# Patient Record
Sex: Male | Born: 1965 | Race: Black or African American | Hispanic: No | Marital: Single | Smoking: Current every day smoker
Health system: Southern US, Community
[De-identification: ages and names within clinical notes are randomized; demographics above are authoritative.]

## PROBLEM LIST (undated history)

## (undated) DIAGNOSIS — I10 Essential (primary) hypertension: Secondary | ICD-10-CM

## (undated) DIAGNOSIS — J449 Chronic obstructive pulmonary disease, unspecified: Secondary | ICD-10-CM

## (undated) DIAGNOSIS — E119 Type 2 diabetes mellitus without complications: Secondary | ICD-10-CM

## (undated) DIAGNOSIS — D571 Sickle-cell disease without crisis: Secondary | ICD-10-CM

## (undated) DIAGNOSIS — I219 Acute myocardial infarction, unspecified: Secondary | ICD-10-CM

## (undated) DIAGNOSIS — F1120 Opioid dependence, uncomplicated: Secondary | ICD-10-CM

## (undated) HISTORY — PX: PORT-A-CATH REMOVAL: SHX5289

## (undated) HISTORY — PX: KNEE SURGERY: SHX244

## (undated) HISTORY — PX: ANKLE SURGERY: SHX546

---

## 2017-01-07 ENCOUNTER — Emergency Department
Admission: EM | Admit: 2017-01-07 | Discharge: 2017-01-08 | Disposition: A | Discharge status: Other Acute Inpatient Hospital | Payer: Medicare Other | Attending: Emergency Medicine | Admitting: Emergency Medicine

## 2017-01-07 ENCOUNTER — Encounter: Payer: Self-pay | Admitting: Emergency Medicine

## 2017-01-07 ENCOUNTER — Emergency Department: Payer: Medicare Other

## 2017-01-07 DIAGNOSIS — E119 Type 2 diabetes mellitus without complications: Secondary | ICD-10-CM | POA: Diagnosis not present

## 2017-01-07 DIAGNOSIS — D5701 Hb-SS disease with acute chest syndrome: Secondary | ICD-10-CM | POA: Diagnosis not present

## 2017-01-07 DIAGNOSIS — D5700 Hb-SS disease with crisis, unspecified: Secondary | ICD-10-CM | POA: Diagnosis not present

## 2017-01-07 DIAGNOSIS — F17200 Nicotine dependence, unspecified, uncomplicated: Secondary | ICD-10-CM | POA: Diagnosis not present

## 2017-01-07 DIAGNOSIS — I10 Essential (primary) hypertension: Secondary | ICD-10-CM | POA: Diagnosis not present

## 2017-01-07 DIAGNOSIS — J449 Chronic obstructive pulmonary disease, unspecified: Secondary | ICD-10-CM | POA: Insufficient documentation

## 2017-01-07 HISTORY — DX: Chronic obstructive pulmonary disease, unspecified: J44.9

## 2017-01-07 HISTORY — DX: Essential (primary) hypertension: I10

## 2017-01-07 HISTORY — DX: Acute myocardial infarction, unspecified: I21.9

## 2017-01-07 HISTORY — DX: Type 2 diabetes mellitus without complications: E11.9

## 2017-01-07 HISTORY — DX: Sickle-cell disease without crisis: D57.1

## 2017-01-07 LAB — CBC
HCT: 39.8 % — ABNORMAL LOW (ref 40.0–52.0)
Hemoglobin: 12.6 g/dL — ABNORMAL LOW (ref 13.0–18.0)
MCH: 22.2 pg — ABNORMAL LOW (ref 26.0–34.0)
MCHC: 31.6 g/dL — ABNORMAL LOW (ref 32.0–36.0)
MCV: 70.2 fL — ABNORMAL LOW (ref 80.0–100.0)
Platelets: 421 10*3/uL (ref 150–440)
RBC: 5.67 MIL/uL (ref 4.40–5.90)
RDW: 18 % — ABNORMAL HIGH (ref 11.5–14.5)
WBC: 17.5 10*3/uL — ABNORMAL HIGH (ref 3.8–10.6)

## 2017-01-07 LAB — COMPREHENSIVE METABOLIC PANEL
ALK PHOS: 113 U/L (ref 38–126)
ALT: 11 U/L — AB (ref 17–63)
AST: 12 U/L — AB (ref 15–41)
Albumin: 3.5 g/dL (ref 3.5–5.0)
Anion gap: 11 (ref 5–15)
BUN: 14 mg/dL (ref 6–20)
CO2: 25 mmol/L (ref 22–32)
CREATININE: 1.17 mg/dL (ref 0.61–1.24)
Calcium: 9.2 mg/dL (ref 8.9–10.3)
Chloride: 95 mmol/L — ABNORMAL LOW (ref 101–111)
GFR calc Af Amer: >60 mL/min (ref >60)
Glucose, Bld: 381 mg/dL — ABNORMAL HIGH (ref 65–99)
Potassium: 4 mmol/L (ref 3.5–5.1)
SODIUM: 131 mmol/L — AB (ref 135–145)
TOTAL PROTEIN: 8.3 g/dL — AB (ref 6.5–8.1)
Total Bilirubin: 0.7 mg/dL (ref 0.3–1.2)

## 2017-01-07 LAB — RETICULOCYTES
RBC.: 5.67 MIL/uL (ref 4.40–5.90)
RETIC COUNT ABSOLUTE: 198.5 10*3/uL — AB (ref 19.0–183.0)
Retic Ct Pct: 3.5 % — ABNORMAL HIGH (ref 0.4–3.1)

## 2017-01-07 LAB — TROPONIN I: Troponin I: <0.03 ng/mL (ref <0.03)

## 2017-01-07 MED ORDER — CEFTRIAXONE SODIUM IN DEXTROSE 20 MG/ML IV SOLN
1.0000 g | INTRAVENOUS | Status: DC
  Administered 2017-01-07: 1 g via INTRAVENOUS
  Filled 2017-01-07 (×2): qty 50

## 2017-01-07 MED ORDER — AZITHROMYCIN 500 MG PO TABS
500.0000 mg | ORAL_TABLET | Freq: Once | ORAL | Status: AC
  Administered 2017-01-07: 500 mg via ORAL
  Filled 2017-01-07: qty 1

## 2017-01-07 MED ORDER — OXYCODONE HCL 5 MG PO TABS
10.0000 mg | ORAL_TABLET | Freq: Once | ORAL | Status: AC
  Administered 2017-01-07: 10 mg via ORAL
  Filled 2017-01-07: qty 2

## 2017-01-07 MED ORDER — HYDROMORPHONE HCL 1 MG/ML IJ SOLN
INTRAMUSCULAR | Status: AC
  Filled 2017-01-07: qty 1

## 2017-01-07 MED ORDER — HYDROMORPHONE HCL 1 MG/ML IJ SOLN
1.0000 mg | INTRAMUSCULAR | Status: DC
  Administered 2017-01-07 – 2017-01-08 (×3): 1 mg via INTRAVENOUS
  Filled 2017-01-07 (×3): qty 1

## 2017-01-07 MED ORDER — HYDROMORPHONE HCL 1 MG/ML IJ SOLN
1.0000 mg | Freq: Once | INTRAMUSCULAR | Status: AC
  Administered 2017-01-07: 1 mg via INTRAVENOUS

## 2017-01-07 MED ORDER — HYDROMORPHONE HCL 1 MG/ML IJ SOLN
2.0000 mg | Freq: Once | INTRAMUSCULAR | Status: DC
  Filled 2017-01-07: qty 2

## 2017-01-07 MED ORDER — HYDROMORPHONE HCL 1 MG/ML IJ SOLN
2.0000 mg | Freq: Once | INTRAMUSCULAR | Status: AC
  Administered 2017-01-07: 2 mg via INTRAMUSCULAR

## 2017-01-07 NOTE — ED Notes (Signed)
This RN informed pt that 1mg  dilaudid was going to be pushed through his IV. Pt started shaking head side to side. This RN asked if pt wanted the medication. Pt states "it ain't going to help. They gave me 10mg  earlier. A few fucking white people had to die and now I can't get any medicine." Pt asked if he wanted the 1mg  dilaudid. Pt stated he still wanted medication. While pushing medication pt started cursing again about medication and "[facility]

## 2017-01-07 NOTE — ED Provider Notes (Signed)
[facility] Emergency Department Provider Note  Time seen: 6:08 PM  I have reviewed the triage vital signs and the nursing notes.   HISTORY  Chief Complaint Sickle Cell Pain Crisis    HPI John Haas is a 51 y.o. male with a past medical history of COPD, diabetes, hypertension, sickle cell disease who presents to the emergency department for chest pain arm and leg pain.  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  States he took his last dose approximately 8-12 hours ago.  He normally gets his methadone refilled at midnight tonight, but states since he has his 1461-month follow-up appointment he does not get his methadone refilled until after that at 2:45 PM tomorrow.  Patient states he is having chest pain arm and leg pain.  States this is typical of his sickle cell pain although states the chest pain is somewhat worse today than he normally gets.  Denies any fever.  Denies any significant cough.  Denies sputum production.  Scribes his pain is severe and fairly generalized.   Past Medical History:  Diagnosis Date  . COPD (chronic obstructive pulmonary disease) (HCC)   . Diabetes mellitus without complication (HCC)   . Heart attack (HCC)   . Hypertension   . Sickle cell anemia (HCC)     There are no active problems to display for this patient.   Past Surgical History:  Procedure Laterality Date  . ANKLE SURGERY    . KNEE SURGERY    . PORT-A-CATH REMOVAL      Prior to Admission medications   Not on File    Allergies  Allergen Reactions  . Ketamine Hives  . Nubain [Nalbuphine Hcl] Hives  . Stadol [Butorphanol] Hives    History reviewed. No pertinent family history.  Social History Social History  Substance Use Topics  . Smoking status: Current Every Day Smoker  . Smokeless tobacco: Never Used  . Alcohol use No    Review of Systems Constitutional: Negative for fever. Cardiovascular: Positive  for chest pain Respiratory: Negative for shortness of breath.  Negative for cough. Gastrointestinal: Negative for abdominal pain Musculoskeletal: Diffuse arm and leg pain. Neurological: Negative for headaches, focal weakness or numbness. All other ROS negative  ____________________________________________   PHYSICAL EXAM:  VITAL SIGNS: ED Triage Vitals  Enc Vitals Group     BP 01/07/17 1711 136/78     Pulse Rate 01/07/17 1711 (!) 110     Resp 01/07/17 1711 (!) 24     Temp 01/07/17 1711 99.6 F (37.6 C)     Temp Source 01/07/17 1711 Oral     SpO2 01/07/17 1711 98 %     Weight 01/07/17 1708 (!) 330 lb (149.7 kg)     Height 01/07/17 1708 6\' 3"  (1.905 m)     Head Circumference --      Peak Flow --      Pain Score 01/07/17 1707 10     Pain Loc --      Pain Edu? --      Excl. in GC? --     Constitutional: Alert and oriented.  Moderate distress due to pain. Eyes: Normal exam ENT   Head: Normocephalic and atraumatic   Mouth/Throat: Mucous membranes are moist. Cardiovascular: Regular rhythm, rate around 120 bpm.  No murmur. Respiratory: Normal respiratory effort without tachypnea nor retractions. Breath sounds are clear.  No wheeze rales or rhonchi. Gastrointestinal: Soft and nontender. No distention.  Musculoskeletal: Nontender with normal range of motion in all extremities.  Diffuse muscular pain tenderness. Neurologic:  Normal speech and language. No gross focal neurologic deficits  Skin:  Skin is warm, dry and intact.  Psychiatric: Mood and affect are normal.   ____________________________________________    EKG  EKG reviewed and interpreted by myself shows sinus tachycardia 109 bpm with a narrow QRS, normal axis, normal intervals with nonspecific ST changes.  ____________________________________________    RADIOLOGY  X-ray is read as focal bibasilar opacities, chronic versus acute possible pneumonia.  Also diffuse interstitial opacities uncertain  chronicity.  No old chest x-ray for comparison.  ____________________________________________   INITIAL IMPRESSION / ASSESSMENT AND PLAN / ED COURSE  Pertinent labs & imaging results that were available during my care of the patient were reviewed by me and considered in my medical decision making (see chart for details).  She presents to the emergency department with diffuse pain in his arms legs and chest pain.  Admits that he is out of methadone but normally has a refill that midnight tonight, we will not have it again until 245 tomorrow.  Differential at this time includes sickle cell pain, acute chest, ACS.  I discussed with the patient checking labs including a troponin as well as reticulocyte count and CBC.  Patient states he is a extremely difficult stick and does not want labs drawn or an IV started.  We ordered an IV team consult as he is a difficult stick and he is refusing IV team attempt as well.  Did not allow nurse attempt.  We will dose IM pain medication.  Patient is agreeable to allow [place] to obtain a chest x-ray and EKG.  Patient's EKG shows nonspecific findings but no ST elevation or significant depression.  Patient does have a borderline temperature 99.6 in the emergency department.  We will recheck this prior to discharge.  I discussed with the patient if his chest x-ray shows any findings we will need an IV and lab work.  If the chest x-ray is negative the patient wishes to be discharged home so we can follow up with his hematologist tomorrow at [facility] at 2 PM.  I reviewed the patient's records, patient signed consent for [facility] records.  Patient has been doing well over the past 10-11 months, has not been admitted to the hospital he states in over one year.  Record review does confirm history of sickle cell disease as well as methadone maintenance.  I reviewed the patient's [facility] narcotic database records as well.  The patient's equivocal chest x-ray with no old chest x-ray for  comparison I discussed with the patient and he is agreeable to blood work and IV.  We will also recheck his temperature.  Patient has not received any antipyretics.  Patient still complaining of significant pain.  I discussed with the patient we will dose 1 mg of Dilaudid every 2 hours, he has not happy with this, but is agreeable ultimately.  Patient's labs have resulted showing a white blood cell count of 17,000.  Given the patient's questionable chest x-ray with significant chest pain low-grade temperature of 99.6 we will treat for acute chest and discuss with [facility] for possible transfer.  In reviewing the patient's records including [facility] records the leukocytosis appears to be somewhat chronic.  However given the patient's continued severe chest pain with initial borderline temperature and chest x-ray showing possible opacities we will continue with transfer to [facility].  We are still awaiting them to  call back.  Was able to speak to [facility] they have accepted the patient ED to ED transfer.  ____________________________________________   FINAL CLINICAL IMPRESSION(S) / ED DIAGNOSES  Sickle cell pain crisis Muscular skeletal pain Chest pain Acute chest syndrome   Minna Antis, MD 01/07/17 2357

## 2017-01-07 NOTE — ED Notes (Addendum)
Pt refusing to be stuck by nurse, states he is "very hard stick and I don't want to be stuck a lot". IV team consult has been placed.

## 2017-01-07 NOTE — ED Notes (Signed)
Pt refuses to leave on pulse ox or cardiac monitor

## 2017-01-07 NOTE — ED Notes (Signed)
Pt refusing IV team and blood work

## 2017-01-07 NOTE — ED Notes (Signed)
Pt returned from xray via stretcher.

## 2017-01-07 NOTE — ED Notes (Signed)
Pt calling out in regards to more pain meds at this time, MD made aware

## 2017-01-07 NOTE — ED Notes (Signed)
Pt taken to xray via stretcher  

## 2017-01-07 NOTE — ED Notes (Signed)
Pt continues to call out asking to see MD, RN informed that MD is with other pts, pt states " that is your favorite line, yall are making this difficult" RN informed MD of pt behavior towards this RN

## 2017-01-07 NOTE — Progress Notes (Signed)
At bedside, pt refuses to lay back in the bed or for PIV assessment to occur.  States he wants IM or  Po pain medicines.  [place] RN also attempted to get pt to allow PIV start, pt continues to refuse.

## 2017-01-07 NOTE — ED Notes (Signed)
Per MD orders placed for 1mg  dilaudid q2hrs , time beginning from last dose at 2015. Pt informed next dose would be at 2215.

## 2017-01-07 NOTE — ED Notes (Signed)
Pt refusing to leave cardiac monitor on, pt will leave pulse ox on at this time

## 2017-01-07 NOTE — ED Notes (Signed)
Pt refusing to go to chest xray before seeing MD for pain meds. MD made aware

## 2017-01-07 NOTE — ED Notes (Signed)
Blanket and cordless phone given to pt at this time

## 2017-01-07 NOTE — ED Notes (Signed)
IV team nurse at bedside. 

## 2017-01-07 NOTE — ED Notes (Signed)
IV team at bedside 

## 2017-01-07 NOTE — ED Notes (Signed)
Butch, RN attempted IV at this time with no success

## 2017-01-07 NOTE — ED Triage Notes (Signed)
Pt c/o sickle crisis.  Started last night, chest pain, arm pain, and leg pain from sickle pain.  Fever 101 today.  Has not been admitted per report since christmas.  [facility] but couldn't make it there today. Has appt in AM with doctor.

## 2017-01-08 DIAGNOSIS — D5700 Hb-SS disease with crisis, unspecified: Secondary | ICD-10-CM | POA: Diagnosis not present

## 2017-12-13 ENCOUNTER — Encounter: Payer: Self-pay | Admitting: Emergency Medicine

## 2017-12-13 ENCOUNTER — Emergency Department: Payer: Medicare Other

## 2017-12-13 ENCOUNTER — Other Ambulatory Visit: Payer: Self-pay

## 2017-12-13 ENCOUNTER — Emergency Department
Admission: EM | Admit: 2017-12-13 | Discharge: 2017-12-13 | Disposition: A | Discharge status: Home / Self Care | Payer: Medicare Other | Attending: Emergency Medicine | Admitting: Emergency Medicine

## 2017-12-13 DIAGNOSIS — M79671 Pain in right foot: Secondary | ICD-10-CM | POA: Diagnosis present

## 2017-12-13 DIAGNOSIS — D5700 Hb-SS disease with crisis, unspecified: Secondary | ICD-10-CM | POA: Insufficient documentation

## 2017-12-13 DIAGNOSIS — E11621 Type 2 diabetes mellitus with foot ulcer: Secondary | ICD-10-CM | POA: Diagnosis not present

## 2017-12-13 DIAGNOSIS — L97519 Non-pressure chronic ulcer of other part of right foot with unspecified severity: Secondary | ICD-10-CM | POA: Insufficient documentation

## 2017-12-13 DIAGNOSIS — F17200 Nicotine dependence, unspecified, uncomplicated: Secondary | ICD-10-CM | POA: Diagnosis not present

## 2017-12-13 DIAGNOSIS — I10 Essential (primary) hypertension: Secondary | ICD-10-CM | POA: Diagnosis not present

## 2017-12-13 DIAGNOSIS — J449 Chronic obstructive pulmonary disease, unspecified: Secondary | ICD-10-CM | POA: Diagnosis not present

## 2017-12-13 DIAGNOSIS — D571 Sickle-cell disease without crisis: Secondary | ICD-10-CM

## 2017-12-13 LAB — CBC WITH DIFFERENTIAL/PLATELET
BAND NEUTROPHILS: 2 %
BASOS ABS: 0 10*3/uL (ref 0–0.1)
BLASTS: 0 %
Basophils Relative: 0 %
Eosinophils Absolute: 0 10*3/uL (ref 0–0.7)
Eosinophils Relative: 0 %
HEMATOCRIT: 33.6 % — AB (ref 40.0–52.0)
HEMOGLOBIN: 10.4 g/dL — AB (ref 13.0–18.0)
LYMPHS PCT: 32 %
Lymphs Abs: 5.4 10*3/uL — ABNORMAL HIGH (ref 1.0–3.6)
MCH: 20.6 pg — AB (ref 26.0–34.0)
MCHC: 31 g/dL — ABNORMAL LOW (ref 32.0–36.0)
MCV: 66.4 fL — ABNORMAL LOW (ref 80.0–100.0)
MONOS PCT: 3 %
Metamyelocytes Relative: 0 %
Monocytes Absolute: 0.5 10*3/uL (ref 0.2–1.0)
Myelocytes: 0 %
Neutro Abs: 11 10*3/uL — ABNORMAL HIGH (ref 1.4–6.5)
Neutrophils Relative %: 63 %
OTHER: 0 %
PROMYELOCYTES RELATIVE: 0 %
Platelets: 525 10*3/uL — ABNORMAL HIGH (ref 150–440)
RBC: 5.05 MIL/uL (ref 4.40–5.90)
RDW: 19 % — ABNORMAL HIGH (ref 11.5–14.5)
WBC: 16.9 10*3/uL — AB (ref 3.8–10.6)
nRBC: 13 /100 WBC — ABNORMAL HIGH

## 2017-12-13 LAB — COMPREHENSIVE METABOLIC PANEL
ALK PHOS: 110 U/L (ref 38–126)
ALT: 9 U/L (ref 0–44)
ANION GAP: 10 (ref 5–15)
AST: 16 U/L (ref 15–41)
Albumin: 3.4 g/dL — ABNORMAL LOW (ref 3.5–5.0)
BILIRUBIN TOTAL: 0.5 mg/dL (ref 0.3–1.2)
BUN: 9 mg/dL (ref 6–20)
CALCIUM: 8.7 mg/dL — AB (ref 8.9–10.3)
CO2: 26 mmol/L (ref 22–32)
Chloride: 97 mmol/L — ABNORMAL LOW (ref 98–111)
Creatinine, Ser: 1.12 mg/dL (ref 0.61–1.24)
Glucose, Bld: 175 mg/dL — ABNORMAL HIGH (ref 70–99)
POTASSIUM: 3.5 mmol/L (ref 3.5–5.1)
Sodium: 133 mmol/L — ABNORMAL LOW (ref 135–145)
TOTAL PROTEIN: 8.4 g/dL — AB (ref 6.5–8.1)

## 2017-12-13 LAB — LACTATE DEHYDROGENASE: LDH: 162 U/L (ref 98–192)

## 2017-12-13 LAB — RETICULOCYTES
RBC.: 5.05 MIL/uL (ref 4.40–5.90)
RETIC CT PCT: 2.5 % (ref 0.4–3.1)
Retic Count, Absolute: 126.3 10*3/uL (ref 19.0–183.0)

## 2017-12-13 LAB — PATHOLOGIST SMEAR REVIEW

## 2017-12-13 MED ORDER — HYDROMORPHONE HCL 1 MG/ML IJ SOLN
2.0000 mg | Freq: Once | INTRAMUSCULAR | Status: AC
  Administered 2017-12-13: 2 mg via INTRAVENOUS

## 2017-12-13 MED ORDER — HYDROMORPHONE HCL 1 MG/ML IJ SOLN
INTRAMUSCULAR | Status: AC
  Filled 2017-12-13: qty 2

## 2017-12-13 MED ORDER — OXYCODONE HCL 5 MG PO TABS
10.0000 mg | ORAL_TABLET | Freq: Once | ORAL | Status: AC
  Administered 2017-12-13: 10 mg via ORAL
  Filled 2017-12-13: qty 2

## 2017-12-13 MED ORDER — ONDANSETRON HCL 4 MG/2ML IJ SOLN
INTRAMUSCULAR | Status: AC
  Filled 2017-12-13: qty 2

## 2017-12-13 MED ORDER — METHADONE HCL 10 MG PO TABS
ORAL_TABLET | ORAL | Status: AC
  Filled 2017-12-13: qty 5

## 2017-12-13 MED ORDER — ONDANSETRON HCL 4 MG/2ML IJ SOLN
4.0000 mg | Freq: Once | INTRAMUSCULAR | Status: AC
  Administered 2017-12-13: 4 mg via INTRAVENOUS

## 2017-12-13 MED ORDER — HYDROMORPHONE HCL 1 MG/ML IJ SOLN
2.0000 mg | Freq: Once | INTRAMUSCULAR | Status: AC
  Administered 2017-12-13: 2 mg via INTRAVENOUS
  Filled 2017-12-13: qty 2

## 2017-12-13 MED ORDER — OXYCODONE HCL 5 MG PO TABS
10.0000 mg | ORAL_TABLET | Freq: Once | ORAL | Status: AC
  Administered 2017-12-13: 10 mg via ORAL

## 2017-12-13 MED ORDER — METHADONE HCL 10 MG PO TABS
50.0000 mg | ORAL_TABLET | Freq: Once | ORAL | Status: AC
  Administered 2017-12-13: 50 mg via ORAL

## 2017-12-13 MED ORDER — OXYCODONE HCL 5 MG PO TABS
ORAL_TABLET | ORAL | Status: AC
  Filled 2017-12-13: qty 2

## 2017-12-13 NOTE — ED Notes (Signed)
Pt ringing call light for more pain medication at this time.  

## 2017-12-13 NOTE — Discharge Instructions (Addendum)
Continue all medications as prescribed by your doctor.  Return to the ER for worsening symptoms, persistent vomiting, difficulty breathing or other concerns.

## 2017-12-13 NOTE — ED Notes (Signed)
Pt ambulating to nurses station to ask for cab voucher.

## 2017-12-13 NOTE — ED Notes (Signed)
Pt screaming in room and seen vomiting in trash can upon this RN's arrival into room.

## 2017-12-13 NOTE — ED Notes (Signed)
Pt standing in hallway asking for more medication. Pt tearful at this time.

## 2017-12-13 NOTE — ED Notes (Signed)
Pt ringing call light in order to talk to MD about another dose of medication.

## 2017-12-13 NOTE — ED Triage Notes (Signed)
Pt arrives via [facility] with c/o foot infection and sickle cell pain crisis. Pt is very upset about being brought to our facility instead of [facility].

## 2017-12-13 NOTE — ED Provider Notes (Signed)
[facility] Emergency Department Provider Note   ____________________________________________   First MD Initiated Contact with Patient 12/13/17 0017     (approximate)  I have reviewed the triage vital signs and the nursing notes.   HISTORY  Chief Complaint Sickle Cell Pain Crisis and Foot Pain    HPI John Haas is a 52 y.o. male brought to the ED via EMS from home with a chief complaint of foot infection and sickle cell pain crisis.  Patient is followed at [facility] for sickle cell.  He is extremely upset that EMS has brought him to our facility as he usually goes to [facility].  Complains of diabetic foot ulcer with exacerbation of pain tonight.  Also complains of typical sickle cell pain in his lower back and legs.  Denies fever, chills, chest pain, shortness of breath, abdominal pain, nausea or vomiting.   Past Medical History:  Diagnosis Date  . COPD (chronic obstructive pulmonary disease) (HCC)   . Diabetes mellitus without complication (HCC)   . Heart attack (HCC)   . Hypertension   . Sickle cell anemia (HCC)     There are no active problems to display for this patient.   Past Surgical History:  Procedure Laterality Date  . ANKLE SURGERY    . KNEE SURGERY    . PORT-A-CATH REMOVAL      Prior to Admission medications   Not on File    Allergies Ketamine; Nubain [nalbuphine hcl]; and Stadol [butorphanol]  No family history on file.  Social History Social History   Tobacco Use  . Smoking status: Current Every Day Smoker  . Smokeless tobacco: Never Used  Substance Use Topics  . Alcohol use: No  . Drug use: No    Review of Systems  Constitutional: No fever/chills Eyes: No visual changes. ENT: No sore throat. Cardiovascular: Denies chest pain. Respiratory: Denies shortness of breath. Gastrointestinal: No abdominal pain.  No nausea, no vomiting.  No diarrhea.  No constipation. Genitourinary: Negative for  dysuria. Musculoskeletal: Negative for back pain. Skin: Negative for rash. Neurological: Negative for headaches, focal weakness or numbness.   ____________________________________________   PHYSICAL EXAM:  VITAL SIGNS: ED Triage Vitals  Enc Vitals Group     BP 12/13/17 0008 (!) 152/76     Pulse Rate 12/13/17 0008 (!) 109     Resp 12/13/17 0008 18     Temp 12/13/17 0008 98.9 F (37.2 C)     Temp Source 12/13/17 0008 Oral     SpO2 12/13/17 0008 98 %     Weight 12/13/17 0006 (!) 330 lb (149.7 kg)     Height 12/13/17 0006 6\' 3"  (1.905 m)     Head Circumference --      Peak Flow --      Pain Score 12/13/17 0006 10     Pain Loc --      Pain Edu? --      Excl. in GC? --     Constitutional: Alert and oriented. Well appearing and in moderate acute distress.  Angry and yelling. Eyes: Conjunctivae are normal. PERRL. EOMI. Head: Atraumatic. Nose: No congestion/rhinnorhea. Mouth/Throat: Mucous membranes are moist.  Oropharynx non-erythematous. Neck: No stridor.   Cardiovascular: Normal rate, regular rhythm. Grossly normal heart sounds.  Good peripheral circulation. Respiratory: Normal respiratory effort.  No retractions. Lungs CTAB. Gastrointestinal: Soft and nontender. No distention. No abdominal bruits. No CVA tenderness. Musculoskeletal:  Right foot: Chronic appearing diabetic foot ulcer on the ball of right foot.  Dorsum of foot appears mildly swollen, erythematous and warm to the touch.  2+ distal pulses.  Brisk, less than 5-second capillary refill. Neurologic:  Normal speech and language. No gross focal neurologic deficits are appreciated.  Skin:  Skin is warm, dry and intact. No rash noted. Psychiatric: Mood and affect are belligerent. Speech and behavior are normal.  ____________________________________________   LABS (all labs ordered are listed, but only abnormal results are displayed)  Labs Reviewed  COMPREHENSIVE METABOLIC PANEL - Abnormal; Notable for the following  components:      Result Value   Sodium 133 (*)    Chloride 97 (*)    Glucose, Bld 175 (*)    Calcium 8.7 (*)    Total Protein 8.4 (*)    Albumin 3.4 (*)    All other components within normal limits  CBC WITH DIFFERENTIAL/PLATELET - Abnormal; Notable for the following components:   WBC 16.9 (*)    Hemoglobin 10.4 (*)    HCT 33.6 (*)    MCV 66.4 (*)    MCH 20.6 (*)    MCHC 31.0 (*)    RDW 19.0 (*)    Platelets 525 (*)    nRBC 13 (*)    Neutro Abs 11.0 (*)    Lymphs Abs 5.4 (*)    All other components within normal limits  RETICULOCYTES  LACTATE DEHYDROGENASE  PATHOLOGIST SMEAR REVIEW   ____________________________________________  EKG  None ____________________________________________  RADIOLOGY  ED MD interpretation: No acute cardiopulmonary process; no evidence of osteomyelitis  Official radiology report(s): Dg Chest Port 1 View  Result Date: 12/13/2017 CLINICAL DATA:  Sickle cell anemia.  Foot infection. EXAM: PORTABLE CHEST 1 VIEW COMPARISON:  Chest radiograph 01/07/2017 FINDINGS: Cardiomegaly mediastinal contours are unchanged from prior exam. Improved interstitial prominence from prior exam. There are persistent ill-defined bibasilar opacities. No new focal airspace disease. No pleural effusion or pneumothorax. Avascular necrosis of bilateral humeral heads. IMPRESSION: 1. No acute airspace disease. 2. Ill-defined bibasilar opacities are unchanged from prior. Cardiomegaly is also unchanged. Electronically Signed   By: Narda Rutherford M.D.   On: 12/13/2017 01:20   Dg Foot Complete Right  Result Date: 12/13/2017 CLINICAL DATA:  Diabetic foot ulcer.  Sickle cell anemia. EXAM: RIGHT FOOT COMPLETE - 3+ VIEW COMPARISON:  None. FINDINGS: Hallux valgus with degenerative change of the first metatarsal phalangeal joint. Hammertoe deformity of the digits. No periosteal reaction or bony destructive change. No fracture. Postsurgical change of the ankle, partially included.  Geographic density in the calcaneus may be a bone infarct, ossified lipoma, or site of prior pin if there was history of external fixator. Point of interest indicated by technologist in the plantar aspect of the metatarsal phalangeal joints. Associated skin defect without tracking soft tissue air. Dorsal soft tissue edema. IMPRESSION: 1. Skin irregularity in the plantar aspect of the metatarsal phalangeal joints may represent site of wound. No radiographic findings of osteomyelitis. 2. Hallux valgus with degenerative change of the first metatarsal phalangeal joint. 3. Surgical hardware in the ankle, partially included. Electronically Signed   By: Narda Rutherford M.D.   On: 12/13/2017 01:26    ____________________________________________   PROCEDURES  Procedure(s) performed: None  Procedures  Critical Care performed: None ____________________________________________   INITIAL IMPRESSION / ASSESSMENT AND PLAN / ED COURSE  As part of my medical decision making, I reviewed the following data within the electronic MEDICAL RECORD NUMBER Nursing notes reviewed and incorporated, Labs reviewed, EKG interpreted, Old chart reviewed, Radiograph reviewed and Notes from  prior ED visits   52 year old male with sickle cell anemia followed by [facility] hospitals who presents with right diabetic foot ulcer and sickle cell pain crisis.  Differential diagnosis includes but is not limited to infectious, metabolic, electrolyte abnormalities, etc.   Clinical Course as of Dec 13 653  [facility] Dec 13, 2017  0045 Patient causing commotion, yelling at the top of his lungs for another dose of IV Dilaudid.  Per records, he usually requires extremely high amounts of IV opiates including oral methadone and oxycodone while at [facility]  Will contact [facility] transfer center for transfer.   [JS]  [phone] Spoke with [facility] transfer center who will discuss with her hospitalist services and call me back.   [JS]  0127 Spoke with hospitalist on call  from [facility] Dr. Adelina Mings who informs me that patient has a care plan at [facility] which includes oral methadone and oral oxycodone while awaiting laboratory data.  They are not in the process of admitting the patient unless his laboratory data is far off from his baseline.  I have explained this to the patient and have ordered his oral methadone in the meantime while we are awaiting his lab work.   [JS]  0214 Updated patient of lab results which looks stable or improved from [facility] 11/2016.  Patient understands his care plan.  His demeanor has changed and he is now cooperative and polite.  Asking for repeat dose of his oral oxycodone prior to discharge.  I have strongly encouraged that he call the sickle cell clinic in the morning.  Strict return precautions given.  Patient verbalizes understanding and agrees with plan of care.   [JS]    Clinical Course User Index [JS] Irean Hong, MD     ____________________________________________   FINAL CLINICAL IMPRESSION(S) / ED DIAGNOSES  Final diagnoses:  Sickle cell pain crisis (HCC)  Diabetic ulcer of right foot associated with type 2 diabetes mellitus, unspecified part of foot, unspecified ulcer stage [facility])     ED Discharge Orders    None       Note:  This document was prepared using Dragon voice recognition software and may include unintentional dictation errors.    Irean Hong, MD 12/13/17 [phone]

## 2017-12-13 NOTE — ED Notes (Signed)
Pt ringing call light for more pain medication at this time.

## 2018-02-16 ENCOUNTER — Other Ambulatory Visit: Payer: Self-pay

## 2018-02-16 ENCOUNTER — Emergency Department
Admission: EM | Admit: 2018-02-16 | Discharge: 2018-02-16 | Disposition: A | Discharge status: Home / Self Care | Payer: Medicare Other | Attending: Emergency Medicine | Admitting: Emergency Medicine

## 2018-02-16 ENCOUNTER — Encounter: Payer: Self-pay | Admitting: Emergency Medicine

## 2018-02-16 DIAGNOSIS — F17200 Nicotine dependence, unspecified, uncomplicated: Secondary | ICD-10-CM | POA: Insufficient documentation

## 2018-02-16 DIAGNOSIS — I252 Old myocardial infarction: Secondary | ICD-10-CM | POA: Insufficient documentation

## 2018-02-16 DIAGNOSIS — E119 Type 2 diabetes mellitus without complications: Secondary | ICD-10-CM | POA: Diagnosis not present

## 2018-02-16 DIAGNOSIS — D5700 Hb-SS disease with crisis, unspecified: Secondary | ICD-10-CM | POA: Insufficient documentation

## 2018-02-16 DIAGNOSIS — R52 Pain, unspecified: Secondary | ICD-10-CM

## 2018-02-16 DIAGNOSIS — J449 Chronic obstructive pulmonary disease, unspecified: Secondary | ICD-10-CM | POA: Diagnosis not present

## 2018-02-16 DIAGNOSIS — M79671 Pain in right foot: Secondary | ICD-10-CM | POA: Diagnosis present

## 2018-02-16 DIAGNOSIS — I10 Essential (primary) hypertension: Secondary | ICD-10-CM | POA: Diagnosis not present

## 2018-02-16 HISTORY — DX: Opioid dependence, uncomplicated: F11.20

## 2018-02-16 MED ORDER — OXYCODONE HCL 5 MG PO TABS
10.0000 mg | ORAL_TABLET | ORAL | Status: AC
  Administered 2018-02-16: 10 mg via ORAL
  Filled 2018-02-16: qty 2

## 2018-02-16 MED ORDER — METHADONE HCL 10 MG PO TABS
50.0000 mg | ORAL_TABLET | ORAL | Status: AC
  Administered 2018-02-16: 50 mg via ORAL
  Filled 2018-02-16: qty 5

## 2018-02-16 NOTE — Discharge Instructions (Signed)
We understand that you have acute on chronic pain today and that you plan to [facility].  You declined any additional work-up today which we feel is appropriate under the circumstances.  You were provided with methadone 50 mg by mouth and oxycodone 10 mg by mouth as a bridge until you can see your doctor later today.  Please understand that this was a one-time treatment and that additional pain medicine needs to come from your doctor.  Return to the emergency department if you develop new or worsening symptoms that concern you.

## 2018-02-16 NOTE — ED Provider Notes (Signed)
[facility] Emergency Department Provider Note  ____________________________________________   First MD Initiated Contact with Patient 02/16/18 [id number]     (approximate)  I have reviewed the triage vital signs and the nursing notes.   HISTORY  Chief Complaint Foot Pain    HPI John Haas is a 52 y.o. 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  He receives his care primarily at [facility] but recently moved to [place].  He presents tonight by private vehicle for acute pain.  He is well-known to me from my time at [facility] years ago.  He presents tonight reporting that his pain is severe and is occurring because he needs refills of his medications.  He states that he tried to make it through until the morning (he arrived at about 5:00 AM) so that he could see his regular hematologist at [facility], but he cannot make it that long.  He said that he has pain throughout his body similar to his prior sickle cell pain and that there is nothing new or different.  He denies fever/chills, chest pain, shortness of breath, cough, nausea, vomiting, and abdominal pain.  The pain is primarily in his arms and legs and specifically in his right foot where he is being treated for a diabetic foot ulcer and is wearing a hard sole shoe.  He said that he does not want an IV, does not want labs, and "I am not making any demands, I just need a dose of my regular medicine from my care plan to get me through until I can see my doctor."  He describes his symptoms as severe and nothing particular makes them better nor worse.  Past Medical History:  Diagnosis Date  . COPD (chronic obstructive pulmonary disease) (HCC)   . Diabetes mellitus without complication (HCC)   . Heart attack (HCC)   . Hypertension   . 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 Sickle cell anemia (HCC)     There are no  active problems to display for this patient.   Past Surgical History:  Procedure Laterality Date  . ANKLE SURGERY    . KNEE SURGERY    . PORT-A-CATH REMOVAL      Prior to Admission medications   Not on File    Allergies Ketamine; Nubain [nalbuphine hcl]; and Stadol [butorphanol]  History reviewed. No pertinent family history.  Social History Social History   Tobacco Use  . Smoking status: Current Every Day Smoker  . Smokeless tobacco: Never Used  Substance Use Topics  . Alcohol use: No  . Drug use: No    Review of Systems Constitutional: No fever/chills Eyes: No visual changes. ENT: No sore throat. Cardiovascular: Denies chest pain. Respiratory: Denies shortness of breath. Gastrointestinal: No abdominal pain.  No nausea, no vomiting.  No diarrhea.  No constipation. Genitourinary: Negative for dysuria. Musculoskeletal: Various pain throughout his body, primarily in extremities specifically including his right foot. Integumentary: Negative for rash.  Patient reports a diabetic foot ulcer on the bottom of the right foot. Neurological: Negative for headaches, focal weakness or numbness.   ____________________________________________   PHYSICAL EXAM:  VITAL SIGNS: ED Triage Vitals  Enc Vitals Group     BP 02/16/18 0451 (!) 153/58     Pulse Rate 02/16/18 0451 96     Resp 02/16/18 0451 20     Temp 02/16/18 0451 98.2 F (36.8 C)  Temp Source 02/16/18 0451 Oral     SpO2 02/16/18 0451 100 %     Weight 02/16/18 0444 (!) 145.2 kg (320 lb)     Height 02/16/18 0444 1.905 m (6\' 3" )     Head Circumference --      Peak Flow --      Pain Score 02/16/18 0444 8     Pain Loc --      Pain Edu? --      Excl. in GC? --     Constitutional: Alert and oriented.  Appears to be in mild distress from pain, rocking back and forth but responsive to questions. Eyes: Conjunctivae are normal.  Head: Atraumatic. Neck: No stridor.  No meningeal signs.   Cardiovascular: Normal  rate, regular rhythm. Good peripheral circulation. Grossly normal heart sounds. Respiratory: Normal respiratory effort.  No retractions. Lungs CTAB. Gastrointestinal: Soft and nontender. No distention.  Musculoskeletal: The patient is wearing a hard sole shoe on his right foot.  I offered to examine the foot and the wound but he declines at this time. Neurologic:  Normal speech and language. No gross focal neurologic deficits are appreciated.  Psychiatric: Mood and affect are normal for this patient.  No warning signs or symptoms currently.  ____________________________________________   LABS (all labs ordered are listed, but only abnormal results are displayed)  Labs Reviewed - No data to display ____________________________________________  EKG  None - EKG not ordered by ED physician ____________________________________________  RADIOLOGY   ED MD interpretation: No indication for imaging  Official radiology report(s): No results found.  ____________________________________________   PROCEDURES  Critical Care performed: No   Procedure(s) performed:   Procedures   ____________________________________________   INITIAL IMPRESSION / ASSESSMENT AND PLAN / ED COURSE  As part of my medical decision making, I reviewed the following data within the electronic MEDICAL RECORD NUMBER Nursing notes reviewed and incorporated, Old chart reviewed, Discuss[facility] with [facility] Hospital[facility] service and reviewed Notes from prior ED visits and reviewed [place] Controlled Substance Database.    The patient is well-known to me from prior emergency department encounters at [facility].  He has the anticipated chronic illnesses associated with sickle cell disease but also had an extreme dependence on narcotics years ago.  Impressively, he has been weaned off of IV narcotics and reviewing the [facility] ED note from about 2 months ago, the physician at that time [facility] and verify that he is on an oral  medication regimen only.  The patient is anxious and rocking back and forth in pain but this is baseline for his visits to the emergency department.  Once I talked to him and discussed wanting to help him with his plan of care to get to [facility] to [facility] his regular doctor, he calm down considerably.  He is not aggressive or making any threats or demands.  He is, however, refusing additional medical work-up and evaluation, which I think is appropriate given that his vital signs are stable, he is afebrile, and he has the capacity make his own decisions.  I did offer in particular to examine his foot ulcer but he says he is going to see his [facility] doctors later today and would rather wait to have blood work done there and to have a more extensive physical exam performed there.  My screening physical exam as documented above was reassuring.  Given that he is a very difficult IV stick, I understand his preference and think it is appropriate.  He told  me that he is on an oral medication regimen and ask for methadone 50 mg p.o. and oxycodone 10 mg p.o. to "tide me over" until he can see his doctor, and I think that that is acceptable and appropriate under the circumstances.  I also called and spoke by phone with Dr. Everardo Beals with the [facility] hospitalist service.  I asked her to verify in their medical records what is his current pain regimen.  She was able to verify that the [facility] ED care plan suggests that the patient receive oral methadone and oral oxycodone while awaiting work-up for sickle cell crisis and that he not be admitted in the absence of any new, acute, or emergent conditions.  The doses were not included so she looked up a recent pharmacy note.  She reports that the most recent pharmacy note she could find indicates that he no longer takes oxycodone.  He is getting methadone 40 mg p.o. every morning, methadone 40 mg p.o. every 2 p.m., and methadone 50 mg p.o. nightly.  This is consistent with what I saw and I reviewed  the [place] controlled substance database; he does not have any recent prescriptions for oxycodone.  However he may still be prescribed some oxycodone to use for breakthrough pain.  The patient was provided the pain medication as described above and he claims he will follow-up with his doctor at [facility] later this morning.  I gave him my usual customary return precautions should his symptoms worsen.  He was not provided with any prescriptions.     ____________________________________________  FINAL CLINICAL IMPRESSION(S) / ED DIAGNOSES  Final diagnoses:  Pain crisis     MEDICATIONS GIVEN DURING THIS VISIT:  Medications  methadone (DOLOPHINE) tablet 50 mg (50 mg Oral Given 02/16/18 0533)  oxyCODONE (Oxy IR/ROXICODONE) immediate release tablet 10 mg (10 mg Oral Given 02/16/18 0533)     ED Discharge Orders    None       Note:  This document was prepared using Dragon voice recognition software and may include unintentional dictation errors.    Loleta Rose, MD 02/16/18 [phone]

## 2018-02-16 NOTE — ED Triage Notes (Addendum)
Patient ambulatory to triage with steady gait, without difficulty or distress noted; Pt reports right foot pain, currently being tx for diabetic ulcer

## 2018-02-16 NOTE — ED Notes (Signed)
Pt stated that he was he was having his typical pain and that he just needed a dose of his oral medication (10 mg oxycodone and 50 mg Methadone) [facility]. [facility] doctor. Dr. York CeriseForbach offered to do lab work and look at pts right foot but pt denied it and stated that the dose of oral medication is all that he needs.

## 2018-03-15 ENCOUNTER — Other Ambulatory Visit: Payer: Self-pay

## 2018-03-15 ENCOUNTER — Encounter: Payer: Self-pay | Admitting: Emergency Medicine

## 2018-03-15 ENCOUNTER — Emergency Department
Admission: EM | Admit: 2018-03-15 | Discharge: 2018-03-15 | Disposition: A | Discharge status: Home / Self Care | Payer: Medicare Other | Attending: Emergency Medicine | Admitting: Emergency Medicine

## 2018-03-15 DIAGNOSIS — L089 Local infection of the skin and subcutaneous tissue, unspecified: Secondary | ICD-10-CM | POA: Insufficient documentation

## 2018-03-15 DIAGNOSIS — Z5321 Procedure and treatment not carried out due to patient leaving prior to being seen by health care provider: Secondary | ICD-10-CM | POA: Insufficient documentation

## 2018-03-15 LAB — CBC WITH DIFFERENTIAL/PLATELET
ABS IMMATURE GRANULOCYTES: 0.07 10*3/uL (ref 0.00–0.07)
BASOS ABS: 0.1 10*3/uL (ref 0.0–0.1)
Basophils Relative: 0 %
Eosinophils Absolute: 0.1 10*3/uL (ref 0.0–0.5)
Eosinophils Relative: 1 %
HCT: 34 % — ABNORMAL LOW (ref 39.0–52.0)
HEMOGLOBIN: 10.6 g/dL — AB (ref 13.0–17.0)
IMMATURE GRANULOCYTES: 0 %
LYMPHS PCT: 21 %
Lymphs Abs: 3.6 10*3/uL (ref 0.7–4.0)
MCH: 20.6 pg — ABNORMAL LOW (ref 26.0–34.0)
MCHC: 31.2 g/dL (ref 30.0–36.0)
MCV: 66.1 fL — ABNORMAL LOW (ref 80.0–100.0)
MONO ABS: 0.9 10*3/uL (ref 0.1–1.0)
Monocytes Relative: 5 %
NEUTROS ABS: 12.5 10*3/uL — AB (ref 1.7–7.7)
NEUTROS PCT: 73 %
NRBC: 1.2 % — AB (ref 0.0–0.2)
PLATELETS: 434 10*3/uL — AB (ref 150–400)
RBC: 5.14 MIL/uL (ref 4.22–5.81)
RDW: 16.5 % — ABNORMAL HIGH (ref 11.5–15.5)
WBC: 17.1 10*3/uL — AB (ref 4.0–10.5)

## 2018-03-15 LAB — COMPREHENSIVE METABOLIC PANEL
ALT: 10 U/L (ref 0–44)
ANION GAP: 8 (ref 5–15)
AST: 20 U/L (ref 15–41)
Albumin: 3.6 g/dL (ref 3.5–5.0)
Alkaline Phosphatase: 123 U/L (ref 38–126)
BUN: 18 mg/dL (ref 6–20)
CHLORIDE: 100 mmol/L (ref 98–111)
CO2: 25 mmol/L (ref 22–32)
Calcium: 8.7 mg/dL — ABNORMAL LOW (ref 8.9–10.3)
Creatinine, Ser: 1.26 mg/dL — ABNORMAL HIGH (ref 0.61–1.24)
GFR calc Af Amer: >60 mL/min (ref >60)
Glucose, Bld: 220 mg/dL — ABNORMAL HIGH (ref 70–99)
POTASSIUM: 4.3 mmol/L (ref 3.5–5.1)
Sodium: 133 mmol/L — ABNORMAL LOW (ref 135–145)
Total Bilirubin: 0.8 mg/dL (ref 0.3–1.2)
Total Protein: 8 g/dL (ref 6.5–8.1)

## 2018-03-15 LAB — TROPONIN I: Troponin I: <0.03 ng/mL (ref <0.03)

## 2018-03-15 LAB — RETICULOCYTES
IMMATURE RETIC FRACT: 24.7 % — AB (ref 2.3–15.9)
RBC.: 5.78 MIL/uL (ref 4.22–5.81)
RETIC COUNT ABSOLUTE: 112.7 10*3/uL (ref 19.0–186.0)
RETIC CT PCT: 2 % (ref 0.4–3.1)

## 2018-03-15 NOTE — ED Notes (Signed)
Lab called for blood draw at this time.

## 2018-03-15 NOTE — ED Notes (Signed)
No answer when called several times from lobby 

## 2018-03-15 NOTE — ED Notes (Signed)
Pt refuses to give urine. Pt advised that he doesn't want to do any of those things until he talks to the doctor.

## 2018-03-15 NOTE — ED Notes (Signed)
PT upset in triage, states " just let me speak to the doctor" this RN explained to pt about protocols and not having any beds at this time. PT states he does not have veins when asked to get lab work

## 2018-03-15 NOTE — ED Triage Notes (Signed)
Pt c/o RT foot infection xfew months and being followed by wound care center clinic, taking antibiotics . PT also states he is in sickle cell crisis as well. PT has ED care plan noted. VSS

## 2018-03-15 NOTE — ED Notes (Signed)
Pt to STAT desk cursing loudly, wanting to know about wait time; pt updated on such and cont to curse loudly stating "ain't nobody in this lobby been here longer than me"; again pt updated on wait time and cont to curse and refuses to move from triage area when instructed so by this nurse and police officer

## 2018-03-16 ENCOUNTER — Telehealth: Payer: Self-pay | Admitting: Emergency Medicine

## 2018-03-16 NOTE — Telephone Encounter (Signed)
Called patient due to lwot to inquire about condition and follow up plans. Left message.   

## 2018-03-22 ENCOUNTER — Emergency Department
Admission: EM | Admit: 2018-03-22 | Discharge: 2018-03-22 | Disposition: A | Discharge status: Home / Self Care | Payer: Medicare Other | Attending: Student in an Organized Health Care Education/Training Program | Admitting: Student in an Organized Health Care Education/Training Program

## 2018-03-22 ENCOUNTER — Encounter: Payer: Self-pay | Admitting: Medical Oncology

## 2018-03-22 DIAGNOSIS — Z760 Encounter for issue of repeat prescription: Secondary | ICD-10-CM | POA: Diagnosis present

## 2018-03-22 DIAGNOSIS — F17200 Nicotine dependence, unspecified, uncomplicated: Secondary | ICD-10-CM | POA: Diagnosis not present

## 2018-03-22 DIAGNOSIS — E118 Type 2 diabetes mellitus with unspecified complications: Secondary | ICD-10-CM

## 2018-03-22 DIAGNOSIS — I252 Old myocardial infarction: Secondary | ICD-10-CM | POA: Diagnosis not present

## 2018-03-22 DIAGNOSIS — L97519 Non-pressure chronic ulcer of other part of right foot with unspecified severity: Secondary | ICD-10-CM | POA: Diagnosis not present

## 2018-03-22 DIAGNOSIS — I10 Essential (primary) hypertension: Secondary | ICD-10-CM | POA: Insufficient documentation

## 2018-03-22 DIAGNOSIS — J449 Chronic obstructive pulmonary disease, unspecified: Secondary | ICD-10-CM | POA: Insufficient documentation

## 2018-03-22 DIAGNOSIS — D5700 Hb-SS disease with crisis, unspecified: Secondary | ICD-10-CM

## 2018-03-22 DIAGNOSIS — E11621 Type 2 diabetes mellitus with foot ulcer: Secondary | ICD-10-CM | POA: Diagnosis not present

## 2018-03-22 MED ORDER — OXYCODONE HCL 5 MG PO TABS
10.0000 mg | ORAL_TABLET | Freq: Once | ORAL | Status: AC
  Administered 2018-03-22: 10 mg via ORAL
  Filled 2018-03-22: qty 2

## 2018-03-22 MED ORDER — OXYCODONE-ACETAMINOPHEN 5-325 MG PO TABS
1.0000 | ORAL_TABLET | Freq: Once | ORAL | Status: AC
  Administered 2018-03-22: 1 via ORAL
  Filled 2018-03-22: qty 1

## 2018-03-22 MED ORDER — METHADONE HCL 10 MG PO TABS
40.0000 mg | ORAL_TABLET | Freq: Once | ORAL | Status: DC
  Filled 2018-03-22: qty 4

## 2018-03-22 MED ORDER — METHADONE HCL 10 MG PO TABS
50.0000 mg | ORAL_TABLET | Freq: Once | ORAL | Status: AC
  Administered 2018-03-22: 50 mg via ORAL
  Filled 2018-03-22: qty 5

## 2018-03-22 MED ORDER — METHADONE HCL 10 MG PO TABS
20.0000 mg | ORAL_TABLET | Freq: Every day | ORAL | Status: DC
  Administered 2018-03-22: 20 mg via ORAL
  Filled 2018-03-22: qty 2

## 2018-03-22 NOTE — ED Notes (Signed)
Pt extremely agitated that he can not have a dose of medicine to go home with. Pt cussing multiple RN's and students, raising his voice at staff.

## 2018-03-22 NOTE — ED Provider Notes (Signed)
[facility] Emergency Department Provider Note    First MD Initiated Contact with Patient 03/22/18 1127     (approximate)  I have reviewed the triage vital signs and the nursing notes.   HISTORY  Chief Complaint Wound Infection and Sickle Cell Pain Crisis    HPI John Haas is a 53 y.o. male extensive past medical history sickle cell disease as well as COPD and diabetes with known diabetic foot ulcer scheduled to follow-up in clinic at [facility] tomorrow presents the ER as he is run out of his home pain medication and appears to be having severe withdrawal.  Patient states that he only wants his regular scheduled pain medication.  Does not want additional blood work done.  Denies any fever, chest pain, nausea or vomiting.  States it feels similar to previous sickle cell pain and even withdrawal that he has had.  States he has prescription refill the release tonight.   Does not have any shortness of breath.  States that foot wound appears similar to previous.  Is not noticed any new purulent drainage.   Past Medical History:  Diagnosis Date  . COPD (chronic obstructive pulmonary disease) (HCC)   . Diabetes mellitus without complication (HCC)   . Heart attack (HCC)   . Hypertension   . 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 Sickle cell anemia (HCC)    No family history on file. Past Surgical History:  Procedure Laterality Date  . ANKLE SURGERY    . KNEE SURGERY    . PORT-A-CATH REMOVAL     There are no active problems to display for this patient.     Prior to Admission medications   Not on File    Allergies Ketamine; Nubain [nalbuphine hcl]; and Stadol [butorphanol]    Social History Social History   Tobacco Use  . Smoking status: Current Every Day Smoker  . Smokeless tobacco: Never Used  Substance Use Topics  . Alcohol use: No  . Drug use: No    Review of Systems Patient denies  headaches, rhinorrhea, blurry vision, numbness, shortness of breath, chest pain, edema, cough, abdominal pain, nausea, vomiting, diarrhea, dysuria, fevers, rashes or hallucinations unless otherwise stated above in HPI. ____________________________________________   PHYSICAL EXAM:  VITAL SIGNS: Vitals:   03/22/18 1002  BP: 121/72  Pulse: (!) 105  Resp: 20  Temp: 98.2 F (36.8 C)  SpO2: 98%    Constitutional: Alert and oriented. Uncomfortable rocking back and forth in the ER bed, but cooperative and not showing any agitation or aggression Eyes: Conjunctivae are normal.  Head: Atraumatic. Nose: No congestion/rhinnorhea. Mouth/Throat: Mucous membranes are moist.   Neck: No stridor. Painless ROM.  Cardiovascular: Normal rate, regular rhythm. Grossly normal heart sounds.  Good peripheral circulation. Respiratory: Normal respiratory effort.  No retractions. Lungs CTAB. Gastrointestinal: Soft and nontender. No distention. No abdominal bruits. No CVA tenderness. Genitourinary:  Musculoskeletal: chronic diabetic foot ulcer to base of right great tooe, no surrounding erythema or warmth. No purulent drainage.  No crepitus.  No joint effusions. Neurologic:  Normal speech and language. No gross focal neurologic deficits are appreciated. No facial droop Skin:  Skin is warm, dry and intact. No rash noted. Psychiatric: Mood and affect are anxious. Speech and behavior are normal.  ____________________________________________   LABS (all labs ordered are listed, but only abnormal results are displayed)  No results found for this or any previous visit (from the past 24  hour(s)). ____________________________________________ ____________________________________________  RADIOLOGY   ____________________________________________   PROCEDURES  Procedure(s) performed:  Procedures    Critical Care performed: no ____________________________________________   INITIAL IMPRESSION /  ASSESSMENT AND PLAN / ED COURSE  Pertinent labs & imaging results that were available during my care of the patient were reviewed by me and considered in my medical decision making (see chart for details).   DDX: pain crisis, withdrawal, diabetic foot  John Haas is a 53 y.o. who presents to the ED with symptoms as described above.  Patient is afebrile.  Does appear uncomfortable.  Patient adamantly refusing any blood work or further diagnostic testing.  Informed patient that this would limit our ability to evaluate and exclude more serious or even life-threatening illnesses.  Patient with multiple comorbidities but seems to have good grasp on his own symptoms.  States that this is his baseline he is very familiar with his symptoms.  States that he does not want any additional testing done particular of the foot and wants to follow-up in clinic tomorrow.  States that it looks at baseline and has not had any sudden worsening.  His primary visit for today was because he ran out of his methadone.  States that he gets to pick this medication up tonight.  States he does not want to stay in the ER any longer.  He is requesting discharge home after I provided a dose of his scheduled home medications.  Recommended patient stay for further observation patient declined this.  As his blood work is otherwise reassuring with only mild tachycardia when the patient was having severe pain with no fever or hypoxia or respiratory distress and patient is refusing additional medical treatment or care will release the patient but have discussed strict return precautions.      As part of my medical decision making, I reviewed the following data within the electronic MEDICAL RECORD NUMBER Nursing notes reviewed and incorporated, Labs reviewed, notes from prior ED visits.   ____________________________________________   FINAL CLINICAL IMPRESSION(S) / ED DIAGNOSES  Final diagnoses:  Sickle cell pain crisis (HCC)    Diabetic foot (HCC)      NEW MEDICATIONS STARTED DURING THIS VISIT:  New Prescriptions   No medications on file     Note:  This document was prepared using Dragon voice recognition software and may include unintentional dictation errors.    Willy Eddy, MD 03/22/18 1151

## 2018-03-22 NOTE — ED Notes (Signed)
Pt cursing at this nurse, yelling and asking for his " extra dose" of methadone. Pt stating " yall are trying to play me"  PT also states he can not ride the bus because he can't walk well. PT ambulatory into triage.

## 2018-03-22 NOTE — ED Triage Notes (Signed)
Pt reports that he has been having pain to rt foot from wound, pt states that he is supposed to have foot debrided tomorrow at wound clinic. Pt states that he isn't able to get his pain medications and is stating that he doesn't "want to sit around for 4-5 hrs in the waiting room waiting on something". Pt declines having blood work done and states that he just needs enough pain meds to last through tomorrow. Pt in uncooperative in triage.

## 2018-03-22 NOTE — Discharge Instructions (Addendum)
Follow up in wound clinic tomorrow.  Return to the ER asap if you change your mind about further workup, testing, observation and pain management.

## 2018-06-21 ENCOUNTER — Other Ambulatory Visit: Payer: Self-pay

## 2018-06-21 ENCOUNTER — Encounter: Payer: Self-pay | Admitting: Emergency Medicine

## 2018-06-21 ENCOUNTER — Emergency Department
Admission: EM | Admit: 2018-06-21 | Discharge: 2018-06-21 | Discharge status: Against Medical Advice | Payer: Medicare Other | Attending: Emergency Medicine | Admitting: Emergency Medicine

## 2018-06-21 DIAGNOSIS — J449 Chronic obstructive pulmonary disease, unspecified: Secondary | ICD-10-CM | POA: Diagnosis not present

## 2018-06-21 DIAGNOSIS — I10 Essential (primary) hypertension: Secondary | ICD-10-CM | POA: Insufficient documentation

## 2018-06-21 DIAGNOSIS — Z5320 Procedure and treatment not carried out because of patient's decision for unspecified reasons: Secondary | ICD-10-CM | POA: Diagnosis not present

## 2018-06-21 DIAGNOSIS — F17200 Nicotine dependence, unspecified, uncomplicated: Secondary | ICD-10-CM | POA: Diagnosis not present

## 2018-06-21 DIAGNOSIS — E119 Type 2 diabetes mellitus without complications: Secondary | ICD-10-CM | POA: Diagnosis not present

## 2018-06-21 DIAGNOSIS — I252 Old myocardial infarction: Secondary | ICD-10-CM | POA: Insufficient documentation

## 2018-06-21 DIAGNOSIS — D5700 Hb-SS disease with crisis, unspecified: Secondary | ICD-10-CM | POA: Diagnosis not present

## 2018-06-21 MED ORDER — METHADONE HCL 10 MG PO TABS
50.0000 mg | ORAL_TABLET | Freq: Once | ORAL | Status: AC
  Administered 2018-06-21: 08:00:00 50 mg via ORAL
  Filled 2018-06-21: qty 5

## 2018-06-21 MED ORDER — OXYCODONE HCL 5 MG PO TABS
10.0000 mg | ORAL_TABLET | Freq: Once | ORAL | Status: AC
  Administered 2018-06-21: 10 mg via ORAL
  Filled 2018-06-21: qty 2

## 2018-06-21 NOTE — ED Notes (Addendum)
Pt stated his ride was here and he did not want to wait for discharge after this nurse asked him to stay for just a few more minutes and left- Dr Mayford Knife notified

## 2018-06-21 NOTE — ED Notes (Signed)
Pt states he would just like his usual pain medication so he could make it to fill his prescription and see his regular doctor

## 2018-06-21 NOTE — ED Triage Notes (Signed)
Patient presents to ED via POV from home with c/o sickle cell pain to legs and shoulders. Patient reports he has been out of his pain medicine since last night. Patient reports he is able to pick up his medicine tomorrow morning but he is not able to wait until then due to severe pain. Labored respirations noted.

## 2018-06-21 NOTE — ED Provider Notes (Signed)
[facility] Emergency Department Provider Note       Time seen: ----------------------------------------- 7:39 AM on 06/21/2018 -----------------------------------------   I have reviewed the triage vital signs and the nursing notes.  HISTORY   Chief Complaint Sickle Cell Pain Crisis    HPI John Haas is a 53 y.o. male with a history of COPD, diabetes, hypertension, opioid dependence, sickle cell anemia who presents to the ED for sickle cell pain.  Patient reports sickle cell pain to his legs and shoulders.  He reports he has been out of his pain medicine since last night, he is able to pick up his medicines tomorrow however.  Patient states he cannot wait until then.  Past Medical History:  Diagnosis Date  . COPD (chronic obstructive pulmonary disease) (HCC)   . Diabetes mellitus without complication (HCC)   . Heart attack (HCC)   . Hypertension   . 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 Sickle cell anemia (HCC)     There are no active problems to display for this patient.   Past Surgical History:  Procedure Laterality Date  . ANKLE SURGERY    . KNEE SURGERY    . PORT-A-CATH REMOVAL      Allergies Ketamine; Nubain [nalbuphine hcl]; and Stadol [butorphanol]  Social History Social History   Tobacco Use  . Smoking status: Current Every Day Smoker  . Smokeless tobacco: Never Used  Substance Use Topics  . Alcohol use: No  . Drug use: No   Review of Systems Constitutional: Negative for fever. Cardiovascular: Negative for chest pain. Respiratory: Negative for shortness of breath. Gastrointestinal: Negative for abdominal pain, vomiting and diarrhea. Musculoskeletal: Positive for leg and shoulder pain Skin: Negative for rash. Neurological: Negative for headaches, focal weakness or numbness.  All systems negative/normal/unremarkable except as stated in the  HPI  ____________________________________________   PHYSICAL EXAM:  VITAL SIGNS: ED Triage Vitals [06/21/18 0735]  Enc Vitals Group     BP (!) 148/116     Pulse Rate (!) 111     Resp (!) 25     Temp 98.3 F (36.8 C)     Temp Source Oral     SpO2 100 %     Weight (!) 330 lb (149.7 kg)     Height 6\' 3"  (1.905 m)     Head Circumference      Peak Flow      Pain Score 9     Pain Loc      Pain Edu?      Excl. in GC?    Constitutional: Alert and oriented.  Somewhat agitated appearance, no distress Eyes: Conjunctivae are normal. Normal extraocular movements. ENT      Head: Normocephalic and atraumatic.      Nose: No congestion/rhinnorhea.      Mouth/Throat: Mucous membranes are moist.      Neck: No stridor. Cardiovascular: Normal rate, regular rhythm. No murmurs, rubs, or gallops. Respiratory: Normal respiratory effort without tachypnea nor retractions. Breath sounds are clear and equal bilaterally. No wheezes/rales/rhonchi. Gastrointestinal: Soft and nontender. Normal bowel sounds Musculoskeletal: Nontender with normal range of motion in extremities. No lower extremity tenderness nor edema. Neurologic:  Normal speech and language. No gross focal neurologic deficits are appreciated.  Skin:  Skin is warm, dry and intact. No rash noted. Psychiatric: Mood and affect are normal. Speech and behavior are normal.  ____________________________________________  ED COURSE:  As part of my medical decision  making, I reviewed the following data within the electronic MEDICAL RECORD NUMBER History obtained from family if available, nursing notes, old chart and ekg, as well as notes from prior ED visits. Patient presented for sickle cell pain, patient is only asking for a dose of his oral medications which is reasonable.   Procedures  Apollos Poveromo was evaluated in Emergency Department on 06/21/2018 for the symptoms described in the history of present illness. He was evaluated in the context of  the global COVID-19 pandemic, which necessitated consideration that the patient might be at risk for infection with the SARS-CoV-2 virus that causes COVID-19. Institutional protocols and algorithms that pertain to the evaluation of patients at risk for COVID-19 are in a state of rapid change based on information released by regulatory bodies including the CDC and federal and state organizations. These policies and algorithms were followed during the patient's care in the ED.  ____________________________________________   DIFFERENTIAL DIAGNOSIS   Sickle cell pain, opioid dependency, medication noncompliance  FINAL ASSESSMENT AND PLAN  Sickle cell pain   Plan: The patient had presented for pain secondary to sickle cell.  Patient did not wish to have any further testing, requested oral medications which we have agreed to.  He is cleared for outpatient follow-up with his typical treatment team.  Vital signs are reassuring and he is afebrile.   Ulice Dash, MD    Note: This note was generated in part or whole with voice recognition software. Voice recognition is usually quite accurate but there are transcription errors that can and very often do occur. I apologize for any typographical errors that were not detected and corrected.     Emily Filbert, MD 06/21/18 [phone]

## 2018-06-27 ENCOUNTER — Other Ambulatory Visit: Payer: Self-pay

## 2018-06-27 ENCOUNTER — Emergency Department
Admission: EM | Admit: 2018-06-27 | Discharge: 2018-06-27 | Disposition: A | Discharge status: Home / Self Care | Payer: Medicare Other | Attending: Emergency Medicine | Admitting: Emergency Medicine

## 2018-06-27 ENCOUNTER — Encounter: Payer: Self-pay | Admitting: Emergency Medicine

## 2018-06-27 DIAGNOSIS — J449 Chronic obstructive pulmonary disease, unspecified: Secondary | ICD-10-CM | POA: Diagnosis not present

## 2018-06-27 DIAGNOSIS — E119 Type 2 diabetes mellitus without complications: Secondary | ICD-10-CM | POA: Insufficient documentation

## 2018-06-27 DIAGNOSIS — K0889 Other specified disorders of teeth and supporting structures: Secondary | ICD-10-CM | POA: Diagnosis present

## 2018-06-27 DIAGNOSIS — K029 Dental caries, unspecified: Secondary | ICD-10-CM | POA: Diagnosis not present

## 2018-06-27 DIAGNOSIS — I10 Essential (primary) hypertension: Secondary | ICD-10-CM | POA: Insufficient documentation

## 2018-06-27 DIAGNOSIS — F17200 Nicotine dependence, unspecified, uncomplicated: Secondary | ICD-10-CM | POA: Insufficient documentation

## 2018-06-27 DIAGNOSIS — D5700 Hb-SS disease with crisis, unspecified: Secondary | ICD-10-CM

## 2018-06-27 MED ORDER — LIDOCAINE VISCOUS HCL 2 % MT SOLN
15.0000 mL | Freq: Once | OROMUCOSAL | Status: AC
  Administered 2018-06-27: 08:00:00 15 mL via OROMUCOSAL
  Filled 2018-06-27: qty 15

## 2018-06-27 MED ORDER — METHADONE HCL 10 MG PO TABS
50.0000 mg | ORAL_TABLET | Freq: Once | ORAL | Status: AC
  Administered 2018-06-27: 08:00:00 50 mg via ORAL
  Filled 2018-06-27: qty 5

## 2018-06-27 MED ORDER — OXYCODONE HCL 5 MG PO TABS
10.0000 mg | ORAL_TABLET | Freq: Once | ORAL | Status: AC
  Administered 2018-06-27: 10 mg via ORAL
  Filled 2018-06-27: qty 2

## 2018-06-27 MED ORDER — AMOXICILLIN 500 MG PO CAPS
500.0000 mg | ORAL_CAPSULE | Freq: Once | ORAL | Status: AC
  Administered 2018-06-27: 500 mg via ORAL
  Filled 2018-06-27: qty 1

## 2018-06-27 MED ORDER — AMOXICILLIN 500 MG PO CAPS: 500.0000 mg | ORAL_CAPSULE | Freq: Three times a day (TID) | ORAL | 0 refills | Status: DC

## 2018-06-27 MED ORDER — LIDOCAINE VISCOUS HCL 2 % MT SOLN: 10.0000 mL | OROMUCOSAL | 0 refills | Status: DC | PRN

## 2018-06-27 NOTE — ED Triage Notes (Signed)
Dental pain, lower left, began Friday. States has been seeing dentist but has not been since COVID restrictions. States also wants dose of pain med for sickle cell but does not want work up for this.

## 2018-06-27 NOTE — ED Provider Notes (Signed)
[facility] Emergency Department Provider Note  ____________________________________________  Time seen: Approximately 7:35 AM  I have reviewed the triage vital signs and the nursing notes.   HISTORY  Chief Complaint Dental Pain    HPI John Haas is a 53 y.o. male that presents emergency department for evaluation of left bottom dental pain for 3 days.  Patient states that he does not have enough methadone to last him until his visit on Wednesday.  He states that he thinks his dental plane is fair and flaring of his sickle cell pain.  He is requesting a dose of methadone and oxycodone in the emergency department.  He states he will call his sickle cell pain doctor today.  He will also call the dentist.  He states that the dentist has been close due to COVID-19 but are supposed to refer him to somebody that can see him.   Past Medical History:  Diagnosis Date  . COPD (chronic obstructive pulmonary disease) (HCC)   . Diabetes mellitus without complication (HCC)   . Heart attack (HCC)   . Hypertension   . 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 Sickle cell anemia (HCC)     There are no active problems to display for this patient.   Past Surgical History:  Procedure Laterality Date  . ANKLE SURGERY    . KNEE SURGERY    . PORT-A-CATH REMOVAL      Prior to Admission medications   Medication Sig Start Date End Date Taking? Authorizing Provider  amoxicillin (AMOXIL) 500 MG capsule Take 1 capsule (500 mg total) by mouth 3 (three) times daily. 06/27/18   Enid Derry, PA-C  lidocaine (XYLOCAINE) 2 % solution Use as directed 10 mLs in the mouth or throat as needed. 06/27/18   Enid Derry, PA-C    Allergies Ketamine; Nubain [nalbuphine hcl]; and Stadol [butorphanol]  No family history on file.  Social History Social History   Tobacco Use  . Smoking status: Current Every Day Smoker  . Smokeless  tobacco: Never Used  Substance Use Topics  . Alcohol use: No  . Drug use: No     Review of Systems  Constitutional: No fever/chills Cardiovascular: No chest pain. Respiratory: No SOB. Gastrointestinal: No nausea, no vomiting.  Musculoskeletal: Positive for bilateral leg and back pain. Skin: Negative for rash, abrasions, lacerations, ecchymosis.   ____________________________________________   PHYSICAL EXAM:  VITAL SIGNS: ED Triage Vitals  Enc Vitals Group     BP 06/27/18 0713 (!) 122/94     Pulse Rate 06/27/18 0713 95     Resp 06/27/18 0713 18     Temp 06/27/18 0713 98.7 F (37.1 C)     Temp Source 06/27/18 0713 Oral     SpO2 06/27/18 0713 97 %     Weight 06/27/18 0714 (!) 330 lb (149.7 kg)     Height 06/27/18 0714 6\' 3"  (1.905 m)     Head Circumference --      Peak Flow --      Pain Score --      Pain Loc --      Pain Edu? --      Excl. in GC? --      Constitutional: Alert and oriented. Well appearing and in no acute distress. Eyes: Conjunctivae are normal. PERRL. EOMI. Head: Atraumatic. ENT:      Ears:      Nose: No congestion/rhinnorhea.      Mouth/Throat:  Mucous membranes are moist.  Tenderness to palpation to tooth #19, 20.  No swelling.  Full range of motion of jaw. Neck: No stridor.   Cardiovascular: Normal rate, regular rhythm.  Good peripheral circulation. Respiratory: Normal respiratory effort without tachypnea or retractions. Lungs CTAB. Good air entry to the bases with no decreased or absent breath sounds. Musculoskeletal: Full range of motion to all extremities. No gross deformities appreciated. Neurologic:  Normal speech and language. No gross focal neurologic deficits are appreciated.  Skin:  Skin is warm, dry and intact. No rash noted. Psychiatric: Mood and affect are normal. Speech and behavior are normal. Patient exhibits appropriate insight and judgement.   ____________________________________________   LABS (all labs ordered are listed,  but only abnormal results are displayed)  Labs Reviewed - No data to display ____________________________________________  EKG   ____________________________________________  RADIOLOGY   No results found.  ____________________________________________    PROCEDURES  Procedure(s) performed:    Procedures    Medications  methadone (DOLOPHINE) tablet 50 mg (has no administration in time range)  oxyCODONE (Oxy IR/ROXICODONE) immediate release tablet 10 mg (has no administration in time range)  amoxicillin (AMOXIL) capsule 500 mg (has no administration in time range)  lidocaine (XYLOCAINE) 2 % viscous mouth solution 15 mL (has no administration in time range)     ____________________________________________   INITIAL IMPRESSION / ASSESSMENT AND PLAN / ED COURSE  Pertinent labs & imaging results that were available during my care of the patient were reviewed by me and considered in my medical decision making (see chart for details).  Review of the [place] CSRS was performed in accordance of the [facility] prior to dispensing any controlled drugs.   Patient's diagnosis is consistent with dental caries and sickle cell pain.  Patient was given a dose of methadone and oxycodone in the emergency department for pain.  Patient will be discharged home with prescriptions for amoxicillin and viscous lidocaine. Patient is to follow up with hematology and dentistry as directed. Patient is given ED precautions to return to the ED for any worsening or new symptoms.     ____________________________________________  FINAL CLINICAL IMPRESSION(S) / ED DIAGNOSES  Final diagnoses:  Dental caries  Sickle cell anemia with pain (HCC)      NEW MEDICATIONS STARTED DURING THIS VISIT:  ED Discharge Orders         Ordered    amoxicillin (AMOXIL) 500 MG capsule  3 times daily     06/27/18 0731    lidocaine (XYLOCAINE) 2 % solution  As needed     06/27/18 0731              This chart  was dictated using voice recognition software/Dragon. Despite best efforts to proofread, errors can occur which can change the meaning. Any change was purely unintentional.    Enid DerryWagner, Bralyn Folkert, PA-C 06/27/18 1540    Jene EveryKinner, Robert, MD 06/30/18 1515

## 2018-06-27 NOTE — Discharge Instructions (Signed)
OPTIONS FOR DENTAL FOLLOW UP CARE ° °[place] Department of Health and Human Services - Local Safety Net Dental Clinics °http://www.ncdhhs.gov/dph/oralhealth/services/safetynetclinics.htm °  °[facility] (336-562-3123) ° °[facility] [place] ([phone]) ° °[facility] ([phone] ext [phone]) ° °[facility] ([phone]) ° °[facility] ([phone]) °This clinic caters to the indigent population and is on a lottery system. °Location: °[facility], [address], [place] °Clinic Hours: °Wednesdays from 6pm - 9pm, patients seen by a lottery system. °For dates, call or go to [email or web address]/[email or web address] °Services: °Cleanings, fillings and simple extractions. °Payment Options: °DENTAL WORK IS FREE OF CHARGE. Bring proof of income or support. °Best way to get seen: °Arrive at 5:15 pm - this is a lottery, NOT first come/first serve, so arriving earlier will not increase your chances of being seen. °  °  °[facility] °[phone] °Select option 1 for emergencies °  °Location: °[facility], [address], [place] °Clinic Hours: °No walk-ins accepted - call the day before to schedule an appointment. °Check in times are 9:30 am and 1:30 pm. °Services: °Simple extractions, temporary fillings, pulpectomy/pulp debridement, uncomplicated abscess drainage. °Payment Options: °PAYMENT IS DUE AT THE TIME OF SERVICE.  Fee is usually $100-200, additional surgical procedures (e.g. abscess drainage) may be extra. °Cash, checks, Visa/MasterCard accepted.  Can file Medicaid if patient is covered for dental - patient should call case worker to check. °No discount for [facility] patients. °Best way to get seen: °MUST call the day before and get onto the schedule. Can usually be seen the next 1-2 days. No walk-ins accepted. °  °  °[facility] °[phone] °   °Location: °[facility], [address], [place] °Clinic Hours: °M, W, Th, F 8am or 1:30pm, Tues 9a or 1:30 - first come/first served. °Services: °Simple extractions, temporary fillings, uncomplicated abscess drainage.  You do not need to be an [place] resident. °Payment Options: °PAYMENT IS DUE AT THE TIME OF SERVICE. °Dental insurance, otherwise sliding scale - bring proof of income or support. °Depending on income and treatment needed, cost is usually $50-200. °Best way to get seen: °Arrive early as it is first come/first served. °  °  °[facility] °[phone] °  °Location: °[address] °Clinic Hours: °Mon-Thu 8a-5p °Services: °Most basic dental services including extractions and fillings. °Payment Options: °PAYMENT IS DUE AT THE TIME OF SERVICE. °Sliding scale, up to 50% off - bring proof if income or support. °Medicaid with dental option accepted. °Best way to get seen: °Call to schedule an appointment, can usually be seen within 2 weeks OR they will try to see walk-ins - show up at 8a or 2p (you may have to wait). °  °  °[facility] °[phone] °[place] RESIDENTS ONLY °  °Location: °[facility], [address], [place], [place] [address] °Clinic Hours: By appointment only. °Monday - Thursday 8am-5pm, Friday 8am-12pm °Services: Cleanings, fillings, extractions. °Payment Options: °PAYMENT IS DUE AT THE TIME OF SERVICE. °Cash, Visa or MasterCard. Sliding scale - $30 minimum per service. °Best way to get seen: °Come in to office, complete packet and make an appointment - need proof of income °or support monies for each household member and proof of [place] residence. °Usually takes about a month to get in. °  °  °[facility] °[phone] °  °Location: °[address]   [place] °Clinic Hours: Walk-in Urgent Care Dental Services are offered Monday-Friday  mornings only. °The numbers of emergencies accepted daily is limited to the number of °providers available. °Maximum 15 - Mondays, Wednesdays & Thursdays °Maximum 10 - Tuesdays & Fridays °Services: °You do not need to be a [place] resident to be seen for a dental emergency. °Emergencies are defined as pain, swelling, abnormal bleeding, or dental trauma. Walkins will receive x-rays if needed. °NOTE: Dental cleaning is not an emergency. °Payment Options: °PAYMENT IS DUE AT THE TIME OF SERVICE. °Minimum co-pay is $40.00 for uninsured patients. °Minimum co-pay is $3.00 for Medicaid with dental coverage. °Dental Insurance is accepted and must be presented at time of visit. °Medicare does not cover dental. °Forms of payment: Cash, credit card, checks. °Best way to get seen: °If not previously registered with the clinic, walk-in dental registration begins at 7:15 am and is on a first come/first serve basis. °If previously registered with the clinic, call to make an appointment. °  °  °[facility] °[phone] °[place] RESIDENTS ONLY °  °Location: °[address], [place], [place] °Clinic Hours: °Mon-Thu 10a-2p °Services: Extractions only! °Payment Options: °FREE (donations accepted) - bring proof of income or support °Best way to get seen: °Call and schedule an appointment OR come at 8am on the 1st Monday of every month (except for holidays) when it is first come/first served. °  °  °[facility] °[phone] °  °Location: °[address], [place] °Clinic Hours: °Friday mornings °Services, Payment Options, Best way to get seen: °Call for info °

## 2018-06-27 NOTE — ED Notes (Signed)
Says left lower molar pain since Friday.  Has been taking his sickle cell medicine.  Also says he is having a sickle cell crisis with back and leg pain.  Says has not eaten in 3 days.  Says cold or hot sets off pain in mouth.

## 2018-08-09 ENCOUNTER — Other Ambulatory Visit: Payer: Self-pay

## 2018-08-09 ENCOUNTER — Emergency Department
Admission: EM | Admit: 2018-08-09 | Discharge: 2018-08-09 | Disposition: A | Discharge status: Home / Self Care | Payer: Medicare Other | Attending: Emergency Medicine | Admitting: Emergency Medicine

## 2018-08-09 DIAGNOSIS — F17200 Nicotine dependence, unspecified, uncomplicated: Secondary | ICD-10-CM | POA: Insufficient documentation

## 2018-08-09 DIAGNOSIS — I10 Essential (primary) hypertension: Secondary | ICD-10-CM | POA: Insufficient documentation

## 2018-08-09 DIAGNOSIS — E119 Type 2 diabetes mellitus without complications: Secondary | ICD-10-CM | POA: Insufficient documentation

## 2018-08-09 DIAGNOSIS — J449 Chronic obstructive pulmonary disease, unspecified: Secondary | ICD-10-CM | POA: Diagnosis not present

## 2018-08-09 DIAGNOSIS — D5700 Hb-SS disease with crisis, unspecified: Secondary | ICD-10-CM

## 2018-08-09 MED ORDER — METHADONE HCL 10 MG PO TABS
40.0000 mg | ORAL_TABLET | Freq: Once | ORAL | Status: AC
  Administered 2018-08-09: 08:00:00 40 mg via ORAL
  Filled 2018-08-09: qty 4

## 2018-08-09 MED ORDER — OXYCODONE HCL 5 MG PO TABS
10.0000 mg | ORAL_TABLET | Freq: Once | ORAL | Status: AC
  Administered 2018-08-09: 10 mg via ORAL
  Filled 2018-08-09: qty 2

## 2018-08-09 NOTE — ED Notes (Signed)
Patient seen walking toward lobby while I was on phone.  Did not return for instructions.

## 2018-08-09 NOTE — ED Triage Notes (Signed)
Pt c/o having sickle cell pain in his back and legs that started last night,. Denies any other sx

## 2018-08-09 NOTE — ED Notes (Signed)
Says pain legs and back since last night.  He says he doesn't want IV.  Just wants to get through until his sickle cell drs come in about 10 am.  Says [facility] goes to [facility].  Denies and sob or chest pain.  He is sitting on side of stretcher grimacing.

## 2018-08-09 NOTE — ED Provider Notes (Signed)
[facility] Emergency Department Provider Note   ____________________________________________    I have reviewed the triage vital signs and the nursing notes.   HISTORY  Chief Complaint Sickle Cell Pain Crisis     HPI John Haas is a 53 y.o. malewith a history of sickle cell anemia, long term use of narcotics for sickle cell pain, htn, copd and dm who presents with typical sickle cell pain. Patient reports he needs his typical PO meds to hold him over until [facility]. Denies fevers or chills. No sob. No cough. Complains of pain in his back and legs which is his usual pain.    Past Medical History:  Diagnosis Date  . COPD (chronic obstructive pulmonary disease) (HCC)   . Diabetes mellitus without complication (HCC)   . Heart attack (HCC)   . Hypertension   . 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 Sickle cell anemia (HCC)     There are no active problems to display for this patient.   Past Surgical History:  Procedure Laterality Date  . ANKLE SURGERY    . KNEE SURGERY    . PORT-A-CATH REMOVAL      Prior to Admission medications   Medication Sig Start Date End Date Taking? Authorizing Provider  amoxicillin (AMOXIL) 500 MG capsule Take 1 capsule (500 mg total) by mouth 3 (three) times daily. 06/27/18   Enid DerryWagner, Ashley, PA-C  lidocaine (XYLOCAINE) 2 % solution Use as directed 10 mLs in the mouth or throat as needed. 06/27/18   Enid DerryWagner, Ashley, PA-C     Allergies Ketamine; Nubain [nalbuphine hcl]; and Stadol [butorphanol]  No family history on file.  Social History Social History   Tobacco Use  . Smoking status: Current Every Day Smoker  . Smokeless tobacco: Never Used  Substance Use Topics  . Alcohol use: No  . Drug use: No    Review of Systems  Constitutional: No fever/chills Eyes: No visual changes.  ENT: No sore throat.  Cardiovascular: Denies chest pain. Respiratory: Denies shortness of breath. Gastrointestinal: No abdominal pain.  No nausea, no vomiting.   Genitourinary: Negative for dysuria. Musculoskeletal: as above Skin: Negative for rash. Neurological: Negative for headaches   ____________________________________________   PHYSICAL EXAM:  VITAL SIGNS: ED Triage Vitals  Enc Vitals Group     BP 08/09/18 0723 124/82     Pulse Rate 08/09/18 0723 85     Resp --      Temp 08/09/18 0723 98.5 F (36.9 C)     Temp Source 08/09/18 0723 Oral     SpO2 08/09/18 0723 100 %     Weight 08/09/18 0719 (!) 149.7 kg (330 lb)     Height 08/09/18 0719 1.905 m (6\' 3" )     Head Circumference --      Peak Flow --      Pain Score 08/09/18 0719 9     Pain Loc --      Pain Edu? --      Excl. in GC? --     Constitutional: Alert and oriented.  Eyes: Conjunctivae are normal.    Cardiovascular: Normal rate, regular rhythm. Grossly normal heart sounds.  Good peripheral circulation. Respiratory: Normal respiratory effort.  No retractions. Lungs CTAB. Gastrointestinal: Soft and nontender. No distention.    Musculoskeletal:   Warm and well perfused Neurologic:  Normal speech and language. No gross focal neurologic deficits  are appreciated.  Skin:  Skin is warm, dry and intact. No rash noted. Psychiatric: Mood and affect are normal. Speech and behavior are normal.  ____________________________________________   LABS (all labs ordered are listed, but only abnormal results are displayed)  Labs Reviewed - No data to display ____________________________________________  EKG   ____________________________________________  RADIOLOGY   ____________________________________________   PROCEDURES  Procedure(s) performed: No  Procedures   Critical Care performed: No ____________________________________________   INITIAL IMPRESSION / ASSESSMENT AND PLAN / ED COURSE  Pertinent labs & imaging  results that were available during my care of the patient were reviewed by me and considered in my medical decision making (see chart for details).  Patient well appearing in no acute distress, reassuring vitals and exam. No SOB, no fever, no chest pain. Only requesting home PO meds c/w care plan. We will dose methadone and roxicodone, patient has ride home. Will follow up with [facility] heme today    ____________________________________________   FINAL CLINICAL IMPRESSION(S) / ED DIAGNOSES  Final diagnoses:  Sickle cell pain crisis (HCC)        Note:  This document was prepared using Dragon voice recognition software and may include unintentional dictation errors.   Jene Every, MD 08/09/18 [phone]

## 2018-09-05 ENCOUNTER — Encounter: Payer: Self-pay | Admitting: Emergency Medicine

## 2018-09-05 ENCOUNTER — Other Ambulatory Visit: Payer: Self-pay

## 2018-09-05 ENCOUNTER — Emergency Department
Admission: EM | Admit: 2018-09-05 | Discharge: 2018-09-05 | Disposition: A | Discharge status: Home / Self Care | Payer: Medicare Other | Attending: Emergency Medicine | Admitting: Emergency Medicine

## 2018-09-05 DIAGNOSIS — M79605 Pain in left leg: Secondary | ICD-10-CM | POA: Insufficient documentation

## 2018-09-05 DIAGNOSIS — M25551 Pain in right hip: Secondary | ICD-10-CM | POA: Diagnosis not present

## 2018-09-05 DIAGNOSIS — M25552 Pain in left hip: Secondary | ICD-10-CM | POA: Diagnosis not present

## 2018-09-05 DIAGNOSIS — I10 Essential (primary) hypertension: Secondary | ICD-10-CM | POA: Diagnosis not present

## 2018-09-05 DIAGNOSIS — F17200 Nicotine dependence, unspecified, uncomplicated: Secondary | ICD-10-CM | POA: Insufficient documentation

## 2018-09-05 DIAGNOSIS — D5700 Hb-SS disease with crisis, unspecified: Secondary | ICD-10-CM | POA: Diagnosis not present

## 2018-09-05 DIAGNOSIS — M545 Low back pain: Secondary | ICD-10-CM | POA: Diagnosis not present

## 2018-09-05 DIAGNOSIS — E119 Type 2 diabetes mellitus without complications: Secondary | ICD-10-CM | POA: Insufficient documentation

## 2018-09-05 DIAGNOSIS — M79604 Pain in right leg: Secondary | ICD-10-CM | POA: Insufficient documentation

## 2018-09-05 DIAGNOSIS — J449 Chronic obstructive pulmonary disease, unspecified: Secondary | ICD-10-CM | POA: Diagnosis not present

## 2018-09-05 MED ORDER — OXYCODONE HCL 5 MG PO TABS
10.0000 mg | ORAL_TABLET | Freq: Once | ORAL | Status: AC
  Administered 2018-09-05: 10 mg via ORAL
  Filled 2018-09-05: qty 2

## 2018-09-05 MED ORDER — METHADONE HCL 10 MG PO TABS
40.0000 mg | ORAL_TABLET | Freq: Once | ORAL | Status: AC
  Administered 2018-09-05: 40 mg via ORAL
  Filled 2018-09-05: qty 4

## 2018-09-05 NOTE — ED Notes (Addendum)
Pt refuses blood draw or IV until he sees provider - he states that all he wants is oral medication  Dr Archie Balboa made aware

## 2018-09-05 NOTE — ED Notes (Signed)
Pt demanding to be released - pt is cursing and states that we treated him wrong - Pt refuses vitals

## 2018-09-05 NOTE — ED Notes (Signed)
Pt in hall with phone on speaker phone calling [facility] - advised pt d/t covid that he was not allowed to be standing in hall - pt returned to room and slammed door

## 2018-09-05 NOTE — ED Provider Notes (Signed)
[facility] Emergency Department Provider Note  ____________________________________________   I have reviewed the triage vital signs and the nursing notes.   HISTORY  Chief Complaint Sickle Cell Pain Crisis   History limited by: Not Limited   HPI John Haas is a 53 y.o. male who presents to the emergency department today because of concern for pain in his hips legs and lower back. He states this is his typical pain with sickle cell disease. He says that he ran out of his home pain medication and took his last dose last night. Says that he has refills that he can pick up in the next day or two. He denies any fevers, shortness of breath or chest pain. Says that he was also recently given a prescription for antibiotics for a dental infection.   Records reviewed. Per medical record review patient has a history of sickle cell disease.   Past Medical History:  Diagnosis Date  . COPD (chronic obstructive pulmonary disease) ([place])   . Diabetes mellitus without complication ([place])   . Heart attack ([place])   . Hypertension   . 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 Sickle cell anemia (HCC)     There are no active problems to display for this patient.   Past Surgical History:  Procedure Laterality Date  . ANKLE SURGERY    . KNEE SURGERY    . PORT-A-CATH REMOVAL      Prior to Admission medications   Medication Sig Start Date End Date Taking? Authorizing Provider  amoxicillin (AMOXIL) 500 MG capsule Take 1 capsule (500 mg total) by mouth 3 (three) times daily. 06/27/18   Laban Emperor, PA-C  lidocaine (XYLOCAINE) 2 % solution Use as directed 10 mLs in the mouth or throat as needed. 06/27/18   Laban Emperor, PA-C    Allergies Ketamine, Nubain [nalbuphine hcl], and Stadol [butorphanol]  No family history on file.  Social History Social History   Tobacco Use  . Smoking status: Current Every Day Smoker   . Smokeless tobacco: Never Used  Substance Use Topics  . Alcohol use: No  . Drug use: No    Review of Systems Constitutional: No fever/chills Eyes: No visual changes. ENT: Positive for dental pain. Cardiovascular: Denies chest pain. Respiratory: Denies shortness of breath. Gastrointestinal: No abdominal pain.  No nausea, no vomiting.  No diarrhea.   Genitourinary: Negative for dysuria. Musculoskeletal: Negative for back pain. Skin: Negative for rash. Neurological: Negative for headaches, focal weakness or numbness.  ____________________________________________   PHYSICAL EXAM:  VITAL SIGNS: ED Triage Vitals  Enc Vitals Group     BP 09/05/18 0718 (!) 143/74     Pulse Rate 09/05/18 0718 87     Resp 09/05/18 0718 20     Temp 09/05/18 0718 99.6 F (37.6 C)     Temp Source 09/05/18 0718 Oral     SpO2 09/05/18 0718 94 %     Weight 09/05/18 0716 (!) 330 lb (149.7 kg)     Height 09/05/18 0716 6\' 3"  (1.905 m)     Head Circumference --      Peak Flow --      Pain Score 09/05/18 0715 10   Constitutional: Alert and oriented.  Eyes: Conjunctivae are normal.  ENT      Head: Normocephalic and atraumatic.      Nose: No congestion/rhinnorhea.      Mouth/Throat: Mucous membranes are moist.  Neck: No stridor. Hematological/Lymphatic/Immunilogical: No cervical lymphadenopathy. Cardiovascular: Normal rate, regular rhythm.  No murmurs, rubs, or gallops. Respiratory: Normal respiratory effort without tachypnea nor retractions. Breath sounds are clear and equal bilaterally. No wheezes/rales/rhonchi. Gastrointestinal: Soft and non tender. No rebound. No guarding.  Genitourinary: Deferred Musculoskeletal: Normal range of motion in all extremities. No lower extremity edema. Neurologic:  Normal speech and language. No gross focal neurologic deficits are appreciated.  Skin:  Skin is warm, dry and intact. No rash noted. Psychiatric: Mood and affect are normal. Speech and behavior are  normal. Patient exhibits appropriate insight and judgment.  ____________________________________________    LABS (pertinent positives/negatives)  None  ____________________________________________   EKG  None  ____________________________________________    RADIOLOGY  None  ____________________________________________   PROCEDURES  Procedures  ____________________________________________   INITIAL IMPRESSION / ASSESSMENT AND PLAN / ED COURSE  Pertinent labs & imaging results that were available during my care of the patient were reviewed by me and considered in my medical decision making (see chart for details).   Patient presented to the emergency department asking for dose of home medication for his sickle cell pain since he ran out of his medication last night. Denies any fevers, chest pain or shortness of breath. Shortly after receiving a dose of his home medication the patient requested to be discharged.   ____________________________________________   FINAL CLINICAL IMPRESSION(S) / ED DIAGNOSES  Final diagnoses:  Sickle-cell disease with pain (HCC)     Note: This dictation was prepared with Dragon dictation. Any transcriptional errors that result from this process are unintentional     Phineas SemenGoodman, Jayro Mcmath, MD 09/05/18 [phone]

## 2018-09-05 NOTE — ED Notes (Signed)
Pt prior to signing for discharge stated "White people with diseases don't get treated like this, like a criminal, like I'm not as good as them because I'm black"

## 2018-09-05 NOTE — ED Triage Notes (Signed)
PT c/o sickle cell pain to his back and legs that started Friday. Pt reports inability to get an appt with his regular doctor and reports inability to control the pain.

## 2018-09-05 NOTE — ED Notes (Signed)
Pt in hall agitated and cursing demanding to know why he is being treated like a criminal - advised pt that I would notify the charge nurse - pt returned to room and slammed the door

## 2018-09-05 NOTE — Discharge Instructions (Addendum)
Please seek medical attention for any high fevers, chest pain, shortness of breath, change in behavior, persistent vomiting, bloody stool or any other new or concerning symptoms.  

## 2018-09-05 NOTE — ED Notes (Signed)
Pt in hall cursing and stating "I ain't did nothing to y'all and y'all know that you are wrong for this"

## 2018-09-05 NOTE — ED Notes (Signed)
Pt constantly ringing call bell Pt screaming and cursing at staff - pt states that we are treating him like a criminal - attempted to deescalate the situation without result - pt continues to curse/yell and states that we are mistreating him and that he know what we are doing to a "black man" is wrong and that if he were "white" the doctor would have already seen him - he reports that he has called [facility] and they will be calling here Pt is now standing in the hall with phone on speaker and then returned to room and slammed door

## 2018-09-05 NOTE — ED Notes (Signed)
Dr Archie Balboa requested pt to sign ROI for [facility] - form signed and set to be placed in chart Pt rings call bell repeatedly asking for pain medication and advised each time that the provider is aware that he is here and will be with him as soon as possible

## 2018-09-05 NOTE — ED Notes (Signed)
Charge nurse aware of situation

## 2018-09-19 ENCOUNTER — Other Ambulatory Visit: Payer: Self-pay

## 2018-09-19 ENCOUNTER — Emergency Department: Payer: Medicare Other

## 2018-09-19 ENCOUNTER — Encounter: Payer: Self-pay | Admitting: Emergency Medicine

## 2018-09-19 ENCOUNTER — Emergency Department
Admission: EM | Admit: 2018-09-19 | Discharge: 2018-09-19 | Disposition: A | Discharge status: Home / Self Care | Payer: Medicare Other | Attending: Emergency Medicine | Admitting: Emergency Medicine

## 2018-09-19 DIAGNOSIS — J449 Chronic obstructive pulmonary disease, unspecified: Secondary | ICD-10-CM | POA: Diagnosis not present

## 2018-09-19 DIAGNOSIS — D571 Sickle-cell disease without crisis: Secondary | ICD-10-CM | POA: Diagnosis not present

## 2018-09-19 DIAGNOSIS — F17200 Nicotine dependence, unspecified, uncomplicated: Secondary | ICD-10-CM | POA: Insufficient documentation

## 2018-09-19 DIAGNOSIS — F1120 Opioid dependence, uncomplicated: Secondary | ICD-10-CM | POA: Insufficient documentation

## 2018-09-19 DIAGNOSIS — G894 Chronic pain syndrome: Secondary | ICD-10-CM | POA: Insufficient documentation

## 2018-09-19 DIAGNOSIS — I10 Essential (primary) hypertension: Secondary | ICD-10-CM | POA: Diagnosis not present

## 2018-09-19 DIAGNOSIS — E119 Type 2 diabetes mellitus without complications: Secondary | ICD-10-CM | POA: Insufficient documentation

## 2018-09-19 DIAGNOSIS — K0889 Other specified disorders of teeth and supporting structures: Secondary | ICD-10-CM | POA: Diagnosis present

## 2018-09-19 LAB — CBC WITH DIFFERENTIAL/PLATELET
Abs Immature Granulocytes: 0.05 10*3/uL (ref 0.00–0.07)
Basophils Absolute: 0.1 10*3/uL (ref 0.0–0.1)
Basophils Relative: 0 %
Eosinophils Absolute: 0.2 10*3/uL (ref 0.0–0.5)
Eosinophils Relative: 1 %
HCT: 30.2 % — ABNORMAL LOW (ref 39.0–52.0)
Hemoglobin: 9.6 g/dL — ABNORMAL LOW (ref 13.0–17.0)
Immature Granulocytes: 0 %
Lymphocytes Relative: 29 %
Lymphs Abs: 4.8 10*3/uL — ABNORMAL HIGH (ref 0.7–4.0)
MCH: 20.7 pg — ABNORMAL LOW (ref 26.0–34.0)
MCHC: 31.8 g/dL (ref 30.0–36.0)
MCV: 65.1 fL — ABNORMAL LOW (ref 80.0–100.0)
Monocytes Absolute: 0.8 10*3/uL (ref 0.1–1.0)
Monocytes Relative: 5 %
Neutro Abs: 10.5 10*3/uL — ABNORMAL HIGH (ref 1.7–7.7)
Neutrophils Relative %: 65 %
Platelets: 477 10*3/uL — ABNORMAL HIGH (ref 150–400)
RBC: 4.64 MIL/uL (ref 4.22–5.81)
RDW: 17.2 % — ABNORMAL HIGH (ref 11.5–15.5)
Smear Review: NORMAL
WBC: 16.4 10*3/uL — ABNORMAL HIGH (ref 4.0–10.5)
nRBC: 0.9 % — ABNORMAL HIGH (ref 0.0–0.2)

## 2018-09-19 LAB — COMPREHENSIVE METABOLIC PANEL
ALT: 9 U/L (ref 0–44)
AST: 12 U/L — ABNORMAL LOW (ref 15–41)
Albumin: 3.3 g/dL — ABNORMAL LOW (ref 3.5–5.0)
Alkaline Phosphatase: 107 U/L (ref 38–126)
Anion gap: 9 (ref 5–15)
BUN: 16 mg/dL (ref 6–20)
CO2: 26 mmol/L (ref 22–32)
Calcium: 8.7 mg/dL — ABNORMAL LOW (ref 8.9–10.3)
Chloride: 99 mmol/L (ref 98–111)
Creatinine, Ser: 1.24 mg/dL (ref 0.61–1.24)
GFR calc Af Amer: >60 mL/min (ref >60)
GFR calc non Af Amer: >60 mL/min (ref >60)
Glucose, Bld: 132 mg/dL — ABNORMAL HIGH (ref 70–99)
Potassium: 4.1 mmol/L (ref 3.5–5.1)
Sodium: 134 mmol/L — ABNORMAL LOW (ref 135–145)
Total Bilirubin: 0.6 mg/dL (ref 0.3–1.2)
Total Protein: 7.9 g/dL (ref 6.5–8.1)

## 2018-09-19 LAB — RETICULOCYTES
Immature Retic Fract: 25.7 % — ABNORMAL HIGH (ref 2.3–15.9)
RBC.: 4.64 MIL/uL (ref 4.22–5.81)
Retic Count, Absolute: 90.5 10*3/uL (ref 19.0–186.0)
Retic Ct Pct: 2 % (ref 0.4–3.1)

## 2018-09-19 MED ORDER — METHADONE HCL 10 MG PO TABS
40.0000 mg | ORAL_TABLET | ORAL | Status: AC
  Administered 2018-09-19: 40 mg via ORAL
  Filled 2018-09-19: qty 4

## 2018-09-19 NOTE — ED Notes (Signed)
Pt states he has been going through some dental issues here recently and has been taking antibiotics for the infection. Pt states he just finished the regimen and feels it has gotten worse. Pt states this has sent him into a Sickle cell crisis. Pt states he is hurting all over. Pt wishes to speak with MD before IV and possibly get some oral medications and sent to [facility] for further treatment.

## 2018-09-19 NOTE — ED Notes (Signed)
Pt standing outside of room yelling and cussing at staff. MD at bedside

## 2018-09-19 NOTE — ED Triage Notes (Signed)
Arrives c/o generalized pain.  Patient states this is his sickle cell pain.  States has a dental infection that he just completed antibiotics.

## 2018-09-19 NOTE — ED Provider Notes (Signed)
[facility] Emergency Department Provider Note  ____________________________________________  Time seen: Approximately 12:36 PM  I have reviewed the triage vital signs and the nursing notes.   HISTORY  Chief Complaint Pain    HPI John Haas is a 53 y.o. male with a history of COPD diabetes hypertension opioid dependence and sickle cell anemia who comes to the ED complaining of dental pain due to a tooth infection.  He reports has been on antibiotics for 10 days and is scheduled to have an extraction in 2 days.  He also notes that he ran out of his methadone and oxycodone yesterday and request more pain medicine.  He reports generalized body aches which he attributes to his sickle cell disease.  No chest pain shortness of breath fevers or chills.  No focal pain complaints or new symptoms.  Symptoms are constant without aggravating or alleviating factors.  Patient is agitated and verbally abusive to staff.    Past Medical History:  Diagnosis Date  . COPD (chronic obstructive pulmonary disease) (HCC)   . Diabetes mellitus without complication (HCC)   . Heart attack (HCC)   . Hypertension   . 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 Sickle cell anemia (HCC)      There are no active problems to display for this patient.    Past Surgical History:  Procedure Laterality Date  . ANKLE SURGERY    . KNEE SURGERY    . PORT-A-CATH REMOVAL       Prior to Admission medications   Medication Sig Start Date End Date Taking? Authorizing Provider  amoxicillin (AMOXIL) 500 MG capsule Take 1 capsule (500 mg total) by mouth 3 (three) times daily. 06/27/18   Enid DerryWagner, Ashley, PA-C  lidocaine (XYLOCAINE) 2 % solution Use as directed 10 mLs in the mouth or throat as needed. 06/27/18   Enid DerryWagner, Ashley, PA-C     Allergies Ketamine, Nubain [nalbuphine hcl], and Stadol [butorphanol]   No family history on file.  Social  History Social History   Tobacco Use  . Smoking status: Current Every Day Smoker  . Smokeless tobacco: Never Used  Substance Use Topics  . Alcohol use: No  . Drug use: No    Review of Systems  Constitutional:   No fever or chills.  ENT:   No sore throat. No rhinorrhea.  Positive dental pain Cardiovascular:   No chest pain or syncope. Respiratory:   No dyspnea or cough. Gastrointestinal:   Negative for abdominal pain, vomiting and diarrhea.  Musculoskeletal:   Negative for focal pain or swelling.  Reports generalized body aches All other systems reviewed and are negative except as documented above in ROS and HPI.  ____________________________________________   PHYSICAL EXAM:  VITAL SIGNS: ED Triage Vitals  Enc Vitals Group     BP 09/19/18 1003 (!) 120/55     Pulse Rate 09/19/18 1003 (!) 104     Resp 09/19/18 1003 18     Temp 09/19/18 1003 98.5 F (36.9 C)     Temp Source 09/19/18 1003 Oral     SpO2 09/19/18 1003 93 %     Weight 09/19/18 1004 (!) 320 lb (145.2 kg)     Height 09/19/18 1004 6\' 3"  (1.905 m)     Head Circumference --      Peak Flow --      Pain Score 09/19/18 1014 9     Pain Loc --  Pain Edu? --      Excl. in [place]? --     Vital signs reviewed, nursing assessments reviewed.   Constitutional:   Alert and oriented. Non-toxic appearance. Eyes:   Conjunctivae are normal. EOMI. PERRL. ENT      Head:   Normocephalic and atraumatic.      Nose:   No congestion/rhinnorhea.       Mouth/Throat:   MMM, no pharyngeal erythema. No peritonsillar mass.  No gingival swelling or broken teeth.  Floor mouth soft, no tongue elevation      Neck:   No meningismus. Full ROM. Hematological/Lymphatic/Immunilogical:   No cervical lymphadenopathy. Cardiovascular:   RRR. Symmetric bilateral radial and DP pulses.  No murmurs. Cap refill less than 2 seconds. Respiratory:   Normal respiratory effort without tachypnea/retractions. Breath sounds are clear and equal bilaterally. No  wheezes/rales/rhonchi. Gastrointestinal:   Soft and nontender. Non distended. There is no CVA tenderness.  No rebound, rigidity, or guarding. Musculoskeletal:   Normal range of motion in all extremities.  Neurologic:   Normal speech and language.  Motor grossly intact. No acute focal neurologic deficits are appreciated.  Skin:    Skin is warm, dry and intact. No rash noted.  No petechiae, purpura, or bullae.  ____________________________________________    LABS (pertinent positives/negatives) (all labs ordered are listed, but only abnormal results are displayed) Labs Reviewed  CBC WITH DIFFERENTIAL/PLATELET - Abnormal; Notable for the following components:      Result Value   WBC 16.4 (*)    Hemoglobin 9.6 (*)    HCT 30.2 (*)    MCV 65.1 (*)    MCH 20.7 (*)    RDW 17.2 (*)    Platelets 477 (*)    nRBC 0.9 (*)    Neutro Abs 10.5 (*)    Lymphs Abs 4.8 (*)    All other components within normal limits  RETICULOCYTES - Abnormal; Notable for the following components:   Immature Retic Fract 25.7 (*)    All other components within normal limits  COMPREHENSIVE METABOLIC PANEL - Abnormal; Notable for the following components:   Sodium 134 (*)    Glucose, Bld 132 (*)    Calcium 8.7 (*)    Albumin 3.3 (*)    AST 12 (*)    All other components within normal limits   ____________________________________________   EKG    ____________________________________________    RADIOLOGY  Dg Chest Port 1 View  Result Date: 09/19/2018 CLINICAL DATA:  Sickle cell disease. Chest pain EXAM: PORTABLE CHEST 1 VIEW COMPARISON:  Radiograph 12/13/2017 FINDINGS: Cardiac silhouette is enlarged. There is bilateral linear interstitial thickening suggesting interstitial and pulmonary venous congestion. No focal consolidation. Lung bases are poorly evaluated. Sclerotic changes in the humeral head. IMPRESSION: Cardiomegaly with pulmonary congestion and interstitial edema. No focal consolidation  identified however cannot exclude superimposed pulmonary infection. Bony changes consistent with sickle cell disease. Electronically Signed   By: Suzy Bouchard M.D.   On: 09/19/2018 12:16    ____________________________________________   PROCEDURES Procedures  ____________________________________________    CLINICAL IMPRESSION / ASSESSMENT AND PLAN / ED COURSE  Medications ordered in the ED: Medications  methadone (DOLOPHINE) tablet 40 mg (40 mg Oral Given 09/19/18 1234)    Pertinent labs & imaging results that were available during my care of the patient were reviewed by me and considered in my medical decision making (see chart for details).  John Haas was evaluated in Emergency Department on 09/19/2018 for the symptoms described in the  history of present illness. He was evaluated in the context of the global COVID-19 pandemic, which necessitated consideration that the patient might be at risk for infection with the SARS-CoV-2 virus that causes COVID-19. Institutional protocols and algorithms that pertain to the evaluation of patients at risk for COVID-19 are in a state of rapid change based on information released by regulatory bodies including the CDC and federal and state organizations. These policies and algorithms were followed during the patient's care in the ED.   Patient presents with dental pain and generalized body aches.  No evidence of acute infectious process in the mouth such as an abscess.  Controlled substance database reviewed which does show that the patient is on methadone 40 mg 3 times daily.  Symptoms are likely due to early opioid withdrawal.  Vital signs are unremarkable, exam is reassuring.  I give the patient a dose of methadone here.  He indicates that he would like to be discharged immediately so that he can [facility] which I think is appropriate.  His labs are reassuring, chest x-ray unremarkable, I do not think he is  having an acute sickle cell disease complication and he is suitable for continued outpatient follow-up.  He also requests oxycodone, but best I can tell this is not a scheduled medication for him and we should start with methadone and let him follow-up.      ____________________________________________   FINAL CLINICAL IMPRESSION(S) / ED DIAGNOSES    Final diagnoses:  Chronic pain syndrome  Uncomplicated opioid dependence (HCC)  Hb-SS disease without crisis [facility](HCC)     ED Discharge Orders    None      Portions of this note were generated with dragon dictation software. Dictation errors may occur despite best attempts at proofreading.   Sharman CheekStafford, Yukiko Minnich, MD 09/19/18 1239

## 2018-09-19 NOTE — ED Notes (Signed)
Xray team at bedside, pt yelling and cussing at staff stating they need to hurry up and this is some "Bullshit". Pt informed that they are working to set up the equipment and will be done as soon as possible.

## 2018-10-04 ENCOUNTER — Emergency Department
Admission: EM | Admit: 2018-10-04 | Discharge: 2018-10-04 | Disposition: A | Discharge status: Home / Self Care | Payer: Medicare Other | Attending: Emergency Medicine | Admitting: Emergency Medicine

## 2018-10-04 ENCOUNTER — Emergency Department: Payer: Medicare Other

## 2018-10-04 DIAGNOSIS — E119 Type 2 diabetes mellitus without complications: Secondary | ICD-10-CM | POA: Diagnosis not present

## 2018-10-04 DIAGNOSIS — I10 Essential (primary) hypertension: Secondary | ICD-10-CM | POA: Insufficient documentation

## 2018-10-04 DIAGNOSIS — F17200 Nicotine dependence, unspecified, uncomplicated: Secondary | ICD-10-CM | POA: Insufficient documentation

## 2018-10-04 DIAGNOSIS — I252 Old myocardial infarction: Secondary | ICD-10-CM | POA: Diagnosis not present

## 2018-10-04 DIAGNOSIS — K0889 Other specified disorders of teeth and supporting structures: Secondary | ICD-10-CM | POA: Diagnosis not present

## 2018-10-04 DIAGNOSIS — D5700 Hb-SS disease with crisis, unspecified: Secondary | ICD-10-CM | POA: Diagnosis present

## 2018-10-04 DIAGNOSIS — J449 Chronic obstructive pulmonary disease, unspecified: Secondary | ICD-10-CM | POA: Insufficient documentation

## 2018-10-04 MED ORDER — METHADONE HCL 10 MG PO TABS
40.0000 mg | ORAL_TABLET | Freq: Once | ORAL | Status: AC
  Administered 2018-10-04: 40 mg via ORAL
  Filled 2018-10-04: qty 4

## 2018-10-04 MED ORDER — OXYCODONE HCL 5 MG PO TABS
10.0000 mg | ORAL_TABLET | Freq: Once | ORAL | Status: AC
  Administered 2018-10-04: 10 mg via ORAL
  Filled 2018-10-04: qty 2

## 2018-10-04 NOTE — ED Notes (Addendum)
Pt provided cola per request.  This RN offered diet cola due to diabetes, pt refuses, states, "I don't drink diet drinks, my sugar will be fine."

## 2018-10-04 NOTE — ED Triage Notes (Addendum)
Patient c/o medial chest pain, facial/dental pain. Patient is a Sickle cell patient with a hx of previous MI. Patient reports active dental infection for which he is on amoxicillin. Patient is scheduled to have his wisdom teeth removed this afternoon. Patient c/o SOB.  Patient also c/o left leg pain/swelling. 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   Patient c/o right foot pain, reports diabetic ulcer on right foot.

## 2018-10-04 NOTE — ED Notes (Signed)
Patient transported to X-ray 

## 2018-10-04 NOTE — ED Provider Notes (Signed)
[facility] Emergency Department Provider Note   ____________________________________________    I have reviewed the triage vital signs and the nursing notes.   HISTORY  Chief Complaint Sickle cell pain and dental pain    HPI John Haas is a 53 y.o. male with a history of sickle cell anemia, COPD, diabetes who presents today because he does not get his refill of his typical pain medication until tomorrow morning.  Patient takes 40 mg of methadone 3 times daily occasionally will also have oxycodone for his sickle cell pain.  Patient is very anxious because he has a dental appointment today to have his wisdom teeth removed which will finally cure him of his dental pain he does not want to miss his appointment.  He feels that his symptoms are consistent with typical sickle cell pain and he needs medication to carry him over.  He reports that he has not had a fever or cough.  Triage note states shortness of breath but he says that his breathing is fine and he just wanted to come back more quickly  Past Medical History:  Diagnosis Date  . COPD (chronic obstructive pulmonary disease) (HCC)   . Diabetes mellitus without complication (HCC)   . Heart attack (HCC)   . Hypertension   . 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 Sickle cell anemia (HCC)     There are no active problems to display for this patient.   Past Surgical History:  Procedure Laterality Date  . ANKLE SURGERY    . KNEE SURGERY    . PORT-A-CATH REMOVAL      Prior to Admission medications   Medication Sig Start Date End Date Taking? Authorizing Provider  amoxicillin (AMOXIL) 500 MG capsule Take 1 capsule (500 mg total) by mouth 3 (three) times daily. 06/27/18   Enid DerryWagner, Ashley, PA-C  lidocaine (XYLOCAINE) 2 % solution Use as directed 10 mLs in the mouth or throat as needed. 06/27/18   Enid DerryWagner, Ashley, PA-C     Allergies Ketamine, Nubain  [nalbuphine hcl], and Stadol [butorphanol]  No family history on file.  Social History Social History   Tobacco Use  . Smoking status: Current Every Day Smoker  . Smokeless tobacco: Never Used  Substance Use Topics  . Alcohol use: No  . Drug use: No    Review of Systems  Constitutional: No fever/chills Eyes: No visual changes.  ENT: Dental pain Cardiovascular: Denies chest pain. Respiratory: Denies shortness of breath. Gastrointestinal: No abdominal pain.  No nausea, no vomiting.   Genitourinary: Negative for dysuria. Musculoskeletal: Negative for back pain. Skin: Negative for rash. Neurological: Negative for headaches   ____________________________________________   PHYSICAL EXAM:  VITAL SIGNS: ED Triage Vitals  Enc Vitals Group     BP 10/04/18 0700 (!) 159/123     Pulse Rate 10/04/18 0700 (!) 104     Resp 10/04/18 0700 20     Temp 10/04/18 0700 99.2 F (37.3 C)     Temp src --      SpO2 10/04/18 0700 100 %     Weight 10/04/18 0701 (!) 149.7 kg (330 lb)     Height 10/04/18 0701 1.905 m (6\' 3" )     Head Circumference --      Peak Flow --      Pain Score 10/04/18 0701 10     Pain Loc --      Pain Edu? --  Excl. in [place]? --     Constitutional: Alert and oriented.  Eyes: Conjunctivae are normal.   Mouth/Throat: Mucous membranes are moist.  No dental abscesses Neck:  Painless ROM Cardiovascular: Normal rate, regular rhythm. Grossly normal heart sounds.  Good peripheral circulation. Respiratory: Normal respiratory effort.  No retractions. Lungs CTAB. Gastrointestinal: Soft and nontender. No distention.    Musculoskeletal: Warm and well perfused Neurologic:  Normal speech and language. No gross focal neurologic deficits are appreciated.  Skin:  Skin is warm, dry and intact. No rash noted. Psychiatric: Mood and affect are normal. Speech and behavior are normal.  ____________________________________________   LABS (all labs ordered are listed, but only  abnormal results are displayed)  Labs Reviewed - No data to display ____________________________________________  EKG ED ECG REPORT I, Lavonia Drafts, the attending physician, personally viewed and interpreted this ECG.  Date: 10/04/2018  Rhythm: normal sinus rhythm QRS Axis: normal Intervals: normal ST/T Wave abnormalities: normal Narrative Interpretation: no evidence of acute ischemia  ____________________________________________  RADIOLOGY  Chest x-ray ____________________________________________   PROCEDURES  Procedure(s) performed: No  Procedures   Critical Care performed: No ____________________________________________   INITIAL IMPRESSION / ASSESSMENT AND PLAN / ED COURSE  Pertinent labs & imaging results that were available during my care of the patient were reviewed by me and considered in my medical decision making (see chart for details).  Patient with history of sickle cell anemia presents here with what he describes as typical sickle cell pain as well as dental pain for which he has a dental appointment at 1230 today.  He is afebrile and overall well-appearing.  Reassuring exam.  Although he denies shortness of breath to me he did tell this to the triage nurse, will obtain chest x-ray, do not think lab work is necessary today.  Will provide his home medications for his sickle cell pain.  Chest x-ray unremarkable, patient appropriate for discharge, return precautions discussed      ____________________________________________   FINAL CLINICAL IMPRESSION(S) / ED DIAGNOSES  Final diagnoses:  Sickle cell anemia with pain ([place])        Note:  This document was prepared using Dragon voice recognition software and may include unintentional dictation errors.   Lavonia Drafts, MD 10/04/18 1004

## 2018-11-15 ENCOUNTER — Emergency Department
Admission: EM | Admit: 2018-11-15 | Discharge: 2018-11-15 | Discharge status: Against Medical Advice | Payer: Medicare Other | Attending: Student | Admitting: Student

## 2018-11-15 ENCOUNTER — Other Ambulatory Visit: Payer: Self-pay

## 2018-11-15 ENCOUNTER — Encounter: Payer: Self-pay | Admitting: Emergency Medicine

## 2018-11-15 DIAGNOSIS — I10 Essential (primary) hypertension: Secondary | ICD-10-CM | POA: Diagnosis not present

## 2018-11-15 DIAGNOSIS — D5700 Hb-SS disease with crisis, unspecified: Secondary | ICD-10-CM | POA: Diagnosis not present

## 2018-11-15 DIAGNOSIS — F17210 Nicotine dependence, cigarettes, uncomplicated: Secondary | ICD-10-CM | POA: Diagnosis not present

## 2018-11-15 DIAGNOSIS — M791 Myalgia, unspecified site: Secondary | ICD-10-CM | POA: Diagnosis present

## 2018-11-15 DIAGNOSIS — E119 Type 2 diabetes mellitus without complications: Secondary | ICD-10-CM | POA: Diagnosis not present

## 2018-11-15 DIAGNOSIS — J449 Chronic obstructive pulmonary disease, unspecified: Secondary | ICD-10-CM | POA: Diagnosis not present

## 2018-11-15 MED ORDER — OXYCODONE HCL 5 MG PO TABS
10.0000 mg | ORAL_TABLET | Freq: Once | ORAL | Status: AC
  Administered 2018-11-15: 12:00:00 10 mg via ORAL
  Filled 2018-11-15: qty 2

## 2018-11-15 MED ORDER — METHADONE HCL 10 MG PO TABS
40.0000 mg | ORAL_TABLET | Freq: Once | ORAL | Status: AC
  Administered 2018-11-15: 40 mg via ORAL
  Filled 2018-11-15: qty 4

## 2018-11-15 NOTE — ED Provider Notes (Signed)
[facility] Emergency Department Provider Note  ____________________________________________   First MD Initiated Contact with Patient 11/15/18 1122     (approximate)  I have reviewed the triage vital signs and the nursing notes.  History  Chief Complaint Sickle Cell Pain Crisis    HPI John Haas is a 53 y.o. male with a history of sickle cell anemia, COPD, diabetes who presents for an episode of his typical exacerbation of his sickle cell pain.  He reports pain in his hands, shoulder, lower back.  He states this is typical of his sickle cell pain.  He denies any fevers, chest pain, or shortness of breath.  He denies any lower extremity weakness, numbness, tingling.  He states he is picking up his medications from [facility] tomorrow, and just needs some assistance with pain control to get through to tomorrow.         Past Medical Hx Past Medical History:  Diagnosis Date  . COPD (chronic obstructive pulmonary disease) ([place])   . Diabetes mellitus without complication ([place])   . Heart attack ([place])   . Hypertension   . Opioid dependence ([place])    long-term narcotics use for sickle cell associated pain  . Sickle cell anemia (HCC)     Problem List There are no active problems to display for this patient.   Past Surgical Hx Past Surgical History:  Procedure Laterality Date  . ANKLE SURGERY    . KNEE SURGERY    . PORT-A-CATH REMOVAL      Medications Prior to Admission medications   Medication Sig Start Date End Date Taking? Authorizing Provider  amoxicillin (AMOXIL) 500 MG capsule Take 1 capsule (500 mg total) by mouth 3 (three) times daily. 06/27/18   Laban Emperor, PA-C  lidocaine (XYLOCAINE) 2 % solution Use as directed 10 mLs in the mouth or throat as needed. 06/27/18   Laban Emperor, PA-C    Allergies Ketamine, Nubain [nalbuphine hcl], and Stadol [butorphanol]  Family Hx No family history on file.  Social Hx Social History   Tobacco  Use  . Smoking status: Current Every Day Smoker  . Smokeless tobacco: Never Used  Substance Use Topics  . Alcohol use: No  . Drug use: No     Review of Systems  Constitutional: Negative for fever. Negative for chills. Eyes: Negative for visual changes. ENT: Negative for sore throat. Cardiovascular: Negative for chest pain. Respiratory: Negative for shortness of breath. Gastrointestinal: Negative for abdominal pain. Negative for nausea. Negative for vomiting. Genitourinary: Negative for dysuria. Musculoskeletal: + hand, shoulder, back pain. Skin: Negative for rash. Neurological: Negative for for headaches.   Physical Exam  Vital Signs: ED Triage Vitals  Enc Vitals Group     BP 11/15/18 1109 (!) 143/80     Pulse Rate 11/15/18 1109 95     Resp --      Temp 11/15/18 1109 98.9 F (37.2 C)     Temp Source 11/15/18 1109 Oral     SpO2 11/15/18 1109 98 %     Weight 11/15/18 1112 (!) 330 lb (149.7 kg)     Height 11/15/18 1112 6\' 3"  (1.905 m)     Head Circumference --      Peak Flow --      Pain Score 11/15/18 1112 9     Pain Loc --      Pain Edu? --      Excl. in [place]? --     Constitutional: Alert and oriented. Obese. Eyes: Conjunctivae  clear. Sclera anicteric. Head: Normocephalic. Atraumatic. Nose: No congestion. No rhinorrhea. Mouth/Throat: Mucous membranes are moist.  Neck: No stridor.   Cardiovascular: Normal rate, regular rhythm.  Extremities well perfused. Respiratory: Normal respiratory effort. No wheezing. Gastrointestinal: Soft and non-tender. No distention.  Musculoskeletal: No lower extremity edema. Neurologic:  Normal speech and language. No gross focal neurologic deficits are appreciated. BLE strength 5/5 and symmetric. SILT. Ambulatory with steady gait. Skin: Skin is warm, dry and intact. No rash noted. Psychiatric: Mood and affect are appropriate for situation.  EKG  N/A    Radiology  N/A   Procedures  Procedure(s) performed (including  critical care):  Procedures   Initial Impression / Assessment and Plan / ED Course  53 y.o. male with history of sickle cell anemia who presents to the ED for increased pain, consistent with his typical sickle cell pain exacerbations.  Exam is unremarkable.  No cough, fever, shortness of breath suggestive of acute chest.  No lower extremity weakness, numbness, tingling, or other neurological symptoms suggestive of acute cord pathology.  Plan: we will provide PO doses of pain medication per his sickle cell plan (PO methadone, oxycodone) and reassess.  On reassessment, patient was not in his room.  Per first nurse, patient was ambulatory with steady gait from the lobby entrance.   Final Clinical Impression(s) / ED Diagnosis  Final diagnoses:  Sickle cell anemia with pain [facility](HCC)       Note:  This document was prepared using Dragon voice recognition software and may include unintentional dictation errors.   Miguel AschoffMonks, Zack Crager L., MD 11/15/18 1420

## 2018-11-15 NOTE — ED Notes (Signed)
First nurse , saw patient ambulating out the lobby entrance.

## 2018-11-15 NOTE — ED Triage Notes (Signed)
Pt here with c/o sickle cell pain in his back, shoulders right foot and left hand that began last night. States he is picking up his meds from [place] tomorrow and needs something to help him get thru, he doesn't have his own transportation. Tearful in triage.

## 2018-11-15 NOTE — ED Notes (Signed)
C/o generalized pain worsening over past few days. Pt hx of sickle cell pain. Pt reports he is out of his mediations and can pick them up tomorrw, has a prescription for these. Pt tearful, hard for pt to sit still.

## 2018-11-22 ENCOUNTER — Encounter: Payer: Self-pay | Admitting: Emergency Medicine

## 2018-11-22 ENCOUNTER — Emergency Department
Admission: EM | Admit: 2018-11-22 | Discharge: 2018-11-22 | Discharge status: Against Medical Advice | Payer: Medicare Other | Attending: Emergency Medicine | Admitting: Emergency Medicine

## 2018-11-22 ENCOUNTER — Other Ambulatory Visit: Payer: Self-pay

## 2018-11-22 DIAGNOSIS — G894 Chronic pain syndrome: Secondary | ICD-10-CM | POA: Insufficient documentation

## 2018-11-22 DIAGNOSIS — J449 Chronic obstructive pulmonary disease, unspecified: Secondary | ICD-10-CM | POA: Insufficient documentation

## 2018-11-22 DIAGNOSIS — F1120 Opioid dependence, uncomplicated: Secondary | ICD-10-CM | POA: Insufficient documentation

## 2018-11-22 DIAGNOSIS — I252 Old myocardial infarction: Secondary | ICD-10-CM | POA: Insufficient documentation

## 2018-11-22 DIAGNOSIS — E119 Type 2 diabetes mellitus without complications: Secondary | ICD-10-CM | POA: Diagnosis not present

## 2018-11-22 DIAGNOSIS — F17200 Nicotine dependence, unspecified, uncomplicated: Secondary | ICD-10-CM | POA: Insufficient documentation

## 2018-11-22 DIAGNOSIS — D5700 Hb-SS disease with crisis, unspecified: Secondary | ICD-10-CM | POA: Diagnosis present

## 2018-11-22 DIAGNOSIS — Z5320 Procedure and treatment not carried out because of patient's decision for unspecified reasons: Secondary | ICD-10-CM | POA: Diagnosis not present

## 2018-11-22 DIAGNOSIS — I10 Essential (primary) hypertension: Secondary | ICD-10-CM | POA: Insufficient documentation

## 2018-11-22 NOTE — ED Notes (Signed)
Pt requesting no blood draw at this time. Wants to talk to DR about just giving him his methadone and oxycodone orally one time today so he can go to his appointment tomorrow. Per Dr Jimmye Norman, pt must be seen by md first.

## 2018-11-22 NOTE — ED Notes (Signed)
Pt from home with pain to his right foot and leg; has diabetic ulcer that he says he has had for a year. He states that the swelling and pain are worse today than usual. Pt tearful during assessment.

## 2018-11-22 NOTE — ED Provider Notes (Signed)
[facility] Emergency Department Provider Note       Time seen: ----------------------------------------- 10:03 AM on 11/22/2018 -----------------------------------------   I have reviewed the triage vital signs and the nursing notes.  HISTORY   Chief Complaint Sickle Cell Pain Crisis    HPI Vladimir CreeksReginald Landa is a 53 y.o. male with a history of COPD, diabetes, MI, hypertension, opioid dependence, sickle cell anemia who presents to the ED requesting pain medicine until he can get his medications filled in the morning.  Patient states he has run out of his pain medication and he cannot get a refill until the morning.  Patient has been here in the past for same.  Past Medical History:  Diagnosis Date  . COPD (chronic obstructive pulmonary disease) (HCC)   . Diabetes mellitus without complication (HCC)   . Heart attack (HCC)   . Hypertension   . 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 Sickle cell anemia (HCC)     There are no active problems to display for this patient.   Past Surgical History:  Procedure Laterality Date  . ANKLE SURGERY    . KNEE SURGERY    . PORT-A-CATH REMOVAL      Allergies Ketamine, Nubain [nalbuphine hcl], and Stadol [butorphanol]  Social History Social History   Tobacco Use  . Smoking status: Current Every Day Smoker  . Smokeless tobacco: Never Used  Substance Use Topics  . Alcohol use: No  . Drug use: No   Review of Systems Constitutional: Negative for fever. Cardiovascular: Negative for chest pain. Respiratory: Negative for shortness of breath. Gastrointestinal: Negative for abdominal pain, vomiting and diarrhea. Musculoskeletal: Positive for right foot pain Skin: Negative for rash. Neurological: Negative for headaches, focal weakness or numbness.  All systems negative/normal/unremarkable except as stated in the  HPI  ____________________________________________   PHYSICAL EXAM:  VITAL SIGNS: ED Triage Vitals  Enc Vitals Group     BP 11/22/18 0853 139/74     Pulse Rate 11/22/18 0853 91     Resp 11/22/18 0853 16     Temp 11/22/18 0853 98.1 F (36.7 C)     Temp Source 11/22/18 0853 Oral     SpO2 11/22/18 0853 100 %     Weight 11/22/18 0854 (!) 330 lb (149.7 kg)     Height 11/22/18 0854 6\' 3"  (1.905 m)     Head Circumference --      Peak Flow --      Pain Score 11/22/18 0854 9     Pain Loc --      Pain Edu? --      Excl. in [place]? --    Constitutional: Alert and oriented. Well appearing and in no distress. Eyes: Conjunctivae are normal. Normal extraocular movements. ENT      Head: Normocephalic and atraumatic.      Nose: No congestion/rhinnorhea.      Mouth/Throat: Mucous membranes are moist.      Neck: No stridor. Cardiovascular: Normal rate, regular rhythm. No murmurs, rubs, or gallops. Respiratory: Normal respiratory effort without tachypnea nor retractions. Breath sounds are clear and equal bilaterally. No wheezes/rales/rhonchi. Gastrointestinal: Soft and nontender. Musculoskeletal: Nontender with normal range of motion in extremities. Neurologic:  Normal speech and language. No gross focal neurologic deficits are appreciated.  Skin: Ulceration over the distal and medial plantar aspect of the right foot, no erythema is noted Psychiatric: Speech and behavior are normal.  ____________________________________________  ED COURSE:  As part of my medical decision making, I reviewed the following data within the [place] History obtained from family if available, nursing notes, old chart and ekg, as well as notes from prior ED visits. Patient presented for chronic pain, patient was advised we do not refill narcotics or provide bridging doses until he can get his medications filled.   Procedures  Issac Moure was evaluated in Emergency Department on 11/22/2018 for  the symptoms described in the history of present illness. He was evaluated in the context of the global COVID-19 pandemic, which necessitated consideration that the patient might be at risk for infection with the SARS-CoV-2 virus that causes COVID-19. Institutional protocols and algorithms that pertain to the evaluation of patients at risk for COVID-19 are in a state of rapid change based on information released by regulatory bodies including the CDC and federal and state organizations. These policies and algorithms were followed during the patient's care in the ED.   ____________________________________________   DIFFERENTIAL DIAGNOSIS   Chronic pain, sickle cell anemia  FINAL ASSESSMENT AND PLAN  Chronic pain, narcotic dependency   Plan: The patient had presented for chronic pain requesting narcotics.  Patient certainly is dependent on narcotics but giving him extra narcotics until he can get his medications filled is not warranted.  He has had visits for this in the past, advised he must follow-up with his sickle cell doctors if he is going to run out before he is supposed to.  It is not the ER's responsibility to refill his narcotics when he takes them more quickly than he should.  Patient eloped.   Laurence Aly, MD    Note: This note was generated in part or whole with voice recognition software. Voice recognition is usually quite accurate but there are transcription errors that can and very often do occur. I apologize for any typographical errors that were not detected and corrected.     Earleen Newport, MD 11/22/18 1022

## 2018-11-22 NOTE — ED Triage Notes (Signed)
C/o sickle cell pain in bilateral arms, right foot swollen and "leaking puss" from diabetic ulcer per pt. Low grade fevers at home.

## 2019-04-17 ENCOUNTER — Emergency Department
Admission: EM | Admit: 2019-04-17 | Discharge: 2019-04-17 | Disposition: A | Discharge status: Home / Self Care | Payer: Medicare Other | Attending: Emergency Medicine | Admitting: Emergency Medicine

## 2019-04-17 ENCOUNTER — Encounter: Payer: Self-pay | Admitting: Emergency Medicine

## 2019-04-17 ENCOUNTER — Other Ambulatory Visit: Payer: Self-pay

## 2019-04-17 DIAGNOSIS — F17210 Nicotine dependence, cigarettes, uncomplicated: Secondary | ICD-10-CM | POA: Insufficient documentation

## 2019-04-17 DIAGNOSIS — I10 Essential (primary) hypertension: Secondary | ICD-10-CM | POA: Diagnosis not present

## 2019-04-17 DIAGNOSIS — L97519 Non-pressure chronic ulcer of other part of right foot with unspecified severity: Secondary | ICD-10-CM | POA: Insufficient documentation

## 2019-04-17 DIAGNOSIS — J449 Chronic obstructive pulmonary disease, unspecified: Secondary | ICD-10-CM | POA: Diagnosis not present

## 2019-04-17 DIAGNOSIS — D5700 Hb-SS disease with crisis, unspecified: Secondary | ICD-10-CM

## 2019-04-17 DIAGNOSIS — E119 Type 2 diabetes mellitus without complications: Secondary | ICD-10-CM | POA: Diagnosis not present

## 2019-04-17 LAB — CBC WITH DIFFERENTIAL/PLATELET
Abs Immature Granulocytes: 0.07 10*3/uL (ref 0.00–0.07)
Basophils Absolute: 0.1 10*3/uL (ref 0.0–0.1)
Basophils Relative: 0 %
Eosinophils Absolute: 0.2 10*3/uL (ref 0.0–0.5)
Eosinophils Relative: 1 %
HCT: 30.8 % — ABNORMAL LOW (ref 39.0–52.0)
Hemoglobin: 9.8 g/dL — ABNORMAL LOW (ref 13.0–17.0)
Immature Granulocytes: 1 %
Lymphocytes Relative: 27 %
Lymphs Abs: 4 10*3/uL (ref 0.7–4.0)
MCH: 21.9 pg — ABNORMAL LOW (ref 26.0–34.0)
MCHC: 31.8 g/dL (ref 30.0–36.0)
MCV: 68.9 fL — ABNORMAL LOW (ref 80.0–100.0)
Monocytes Absolute: 1 10*3/uL (ref 0.1–1.0)
Monocytes Relative: 6 %
Neutro Abs: 9.9 10*3/uL — ABNORMAL HIGH (ref 1.7–7.7)
Neutrophils Relative %: 65 %
Platelets: 461 10*3/uL — ABNORMAL HIGH (ref 150–400)
RBC: 4.47 MIL/uL (ref 4.22–5.81)
RDW: 16.7 % — ABNORMAL HIGH (ref 11.5–15.5)
WBC: 15.1 10*3/uL — ABNORMAL HIGH (ref 4.0–10.5)
nRBC: 3 % — ABNORMAL HIGH (ref 0.0–0.2)

## 2019-04-17 LAB — BASIC METABOLIC PANEL
Anion gap: 10 (ref 5–15)
BUN: 12 mg/dL (ref 6–20)
CO2: 25 mmol/L (ref 22–32)
Calcium: 8.8 mg/dL — ABNORMAL LOW (ref 8.9–10.3)
Chloride: 94 mmol/L — ABNORMAL LOW (ref 98–111)
Creatinine, Ser: 1.2 mg/dL (ref 0.61–1.24)
GFR calc Af Amer: >60 mL/min (ref >60)
GFR calc non Af Amer: >60 mL/min (ref >60)
Glucose, Bld: 178 mg/dL — ABNORMAL HIGH (ref 70–99)
Potassium: 3.7 mmol/L (ref 3.5–5.1)
Sodium: 129 mmol/L — ABNORMAL LOW (ref 135–145)

## 2019-04-17 LAB — RETICULOCYTES
Immature Retic Fract: 32.9 % — ABNORMAL HIGH (ref 2.3–15.9)
RBC.: 4.45 MIL/uL (ref 4.22–5.81)
Retic Count, Absolute: 154.4 10*3/uL (ref 19.0–186.0)
Retic Ct Pct: 3.5 % — ABNORMAL HIGH (ref 0.4–3.1)

## 2019-04-17 LAB — GLUCOSE, CAPILLARY: Glucose-Capillary: 148 mg/dL — ABNORMAL HIGH (ref 70–99)

## 2019-04-17 MED ORDER — KETOROLAC TROMETHAMINE 30 MG/ML IJ SOLN
30.0000 mg | Freq: Once | INTRAMUSCULAR | Status: AC
  Administered 2019-04-17: 15:00:00 30 mg via INTRAVENOUS
  Filled 2019-04-17: qty 1

## 2019-04-17 MED ORDER — DOXYCYCLINE HYCLATE 100 MG PO CAPS: 100.0000 mg | ORAL_CAPSULE | Freq: Two times a day (BID) | ORAL | 0 refills | Status: AC

## 2019-04-17 MED ORDER — OXYCODONE HCL 5 MG PO TABS
10.0000 mg | ORAL_TABLET | Freq: Once | ORAL | Status: AC
  Administered 2019-04-17: 10 mg via ORAL
  Filled 2019-04-17: qty 2

## 2019-04-17 MED ORDER — METHADONE HCL 10 MG PO TABS
20.0000 mg | ORAL_TABLET | Freq: Once | ORAL | Status: AC
  Administered 2019-04-17: 15:00:00 20 mg via ORAL
  Filled 2019-04-17: qty 2

## 2019-04-17 MED ORDER — SODIUM CHLORIDE 0.9 % IV BOLUS
1000.0000 mL | Freq: Once | INTRAVENOUS | Status: AC
  Administered 2019-04-17: 1000 mL via INTRAVENOUS

## 2019-04-17 NOTE — ED Provider Notes (Signed)
Patient was demanding to leave before his CBC results returned.   Emily Filbert, MD 04/17/19 [phone]

## 2019-04-17 NOTE — ED Provider Notes (Signed)
[facility] Emergency Department Provider Note  ____________________________________________   I have reviewed the triage vital signs and the nursing notes.   HISTORY  Chief Complaint Diabetic Ulcer   History limited by: Not Limited   HPI John Haas is a 54 y.o. male who presents to the emergency department today because of concern for foot ulcer as well as sickle cell crises. Patient states that he is being followed at [facility] for these issues. Says he just finished a course of antibiotics a couple of days ago. Feels like the foot has become red again. The patient denies any fevers. States he is worried that he is now in a sickle cell crisis. Tried taking his home medication without any significant relief.    Records reviewed. Per medical record review patient has a history of recent visit to [facility] and hospitalization for sickle cell pain and ulcer.   Past Medical History:  Diagnosis Date  . COPD (chronic obstructive pulmonary disease) (HCC)   . Diabetes mellitus without complication (HCC)   . Heart attack (HCC)   . Hypertension   . 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 Sickle cell anemia (HCC)     There are no problems to display for this patient.   Past Surgical History:  Procedure Laterality Date  . ANKLE SURGERY    . KNEE SURGERY    . PORT-A-CATH REMOVAL      Prior to Admission medications   Medication Sig Start Date End Date Taking? Authorizing Provider  amoxicillin (AMOXIL) 500 MG capsule Take 1 capsule (500 mg total) by mouth 3 (three) times daily. 06/27/18   Enid Derry, PA-C  lidocaine (XYLOCAINE) 2 % solution Use as directed 10 mLs in the mouth or throat as needed. 06/27/18   Enid Derry, PA-C    Allergies Ketamine, Nubain [nalbuphine hcl], and Stadol [butorphanol]  History reviewed. No pertinent family history.  Social History Social History   Tobacco Use  . Smoking status:  Current Every Day Smoker  . Smokeless tobacco: Never Used  Substance Use Topics  . Alcohol use: No  . Drug use: No    Review of Systems Constitutional: No fever/chills Eyes: No visual changes. ENT: No sore throat. Cardiovascular: Denies chest pain. Respiratory: Denies shortness of breath. Gastrointestinal: No abdominal pain.  No nausea, no vomiting.  No diarrhea.   Genitourinary: Negative for dysuria. Musculoskeletal: Positive for right foot pain.  Skin: Negative for rash. Neurological: Negative for headaches, focal weakness or numbness.  ____________________________________________   PHYSICAL EXAM:  VITAL SIGNS: ED Triage Vitals  Enc Vitals Group     BP 04/17/19 1408 (!) 141/92     Pulse Rate 04/17/19 1408 89     Resp 04/17/19 1408 18     Temp 04/17/19 1408 98.1 F (36.7 C)     Temp Source 04/17/19 1408 Oral     SpO2 04/17/19 1408 98 %     Weight 04/17/19 1409 (!) 325 lb (147.4 kg)     Height 04/17/19 1409 6\' 3"  (1.905 m)     Head Circumference --      Peak Flow --      Pain Score 04/17/19 1409 9   Constitutional: Alert and oriented.  Eyes: Conjunctivae are normal.  ENT      Head: Normocephalic and atraumatic.      Nose: No congestion/rhinnorhea.      Mouth/Throat: Mucous membranes are moist.      Neck:  No stridor. Hematological/Lymphatic/Immunilogical: No cervical lymphadenopathy. Cardiovascular: Normal rate, regular rhythm.  No murmurs, rubs, or gallops.  Respiratory: Normal respiratory effort without tachypnea nor retractions. Breath sounds are clear and equal bilaterally. No wheezes/rales/rhonchi. Gastrointestinal: Soft and non tender. No rebound. No guarding.  Genitourinary: Deferred Musculoskeletal: Normal range of motion in all extremities. No lower extremity edema. Neurologic:  Normal speech and language. No gross focal neurologic deficits are appreciated.  Skin:  Ulcer to base of right great toe. Slight erythema. No drainage. Psychiatric: Mood and  affect are normal. Speech and behavior are normal. Patient exhibits appropriate insight and judgment.  ____________________________________________    LABS (pertinent positives/negatives)  BMP na 129, gl 94, glu 178, cr 1.20 Retic ct pct 3.5  ____________________________________________   EKG  None  ____________________________________________    RADIOLOGY  None  ____________________________________________   PROCEDURES  Procedures  ____________________________________________   INITIAL IMPRESSION / ASSESSMENT AND PLAN / ED COURSE  Pertinent labs & imaging results that were available during my care of the patient were reviewed by me and considered in my medical decision making (see chart for details).   Patient presented to the emergency department today because of concerns for sickle cell crisis and right foot pain in the setting of ulcer.  On exam there is slight erythema around the ulcer.  Patient states he did recently finish a course of antibiotics.  Will give patient oral home pain medications.  Will plan on discharging with antibiotics.  ____________________________________________   FINAL CLINICAL IMPRESSION(S) / ED DIAGNOSES  Final diagnoses:  Ulcer of right foot, unspecified ulcer stage ([place])  Sickle cell pain crisis [facility])     Note: This dictation was prepared with Dragon dictation. Any transcriptional errors that result from this process are unintentional     Nance Pear, MD 04/17/19 1550

## 2019-04-17 NOTE — Discharge Instructions (Addendum)
Please seek medical attention for any high fevers, chest pain, shortness of breath, change in behavior, persistent vomiting, bloody stool or any other new or concerning symptoms.  

## 2019-04-17 NOTE — ED Notes (Signed)
Pt presents w/ generalized pain r/t sickle cell exacerbation from diabetic ulcer on rt foot. Pt has had ulcerr for 8 months and get regular tx at [facility].

## 2019-04-17 NOTE — ED Triage Notes (Signed)
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 Pt gets regular tx from [facility] but [facility] could not take him there.

## 2019-07-01 ENCOUNTER — Ambulatory Visit: Payer: Medicare Other | Attending: Internal Medicine

## 2019-07-03 ENCOUNTER — Emergency Department
Admission: EM | Admit: 2019-07-03 | Discharge: 2019-07-04 | Disposition: A | Discharge status: Home / Self Care | Payer: Medicare Other | Source: Home / Self Care | Attending: Emergency Medicine | Admitting: Emergency Medicine

## 2019-07-03 ENCOUNTER — Emergency Department: Payer: Medicare Other

## 2019-07-03 ENCOUNTER — Other Ambulatory Visit: Payer: Self-pay

## 2019-07-03 DIAGNOSIS — G894 Chronic pain syndrome: Secondary | ICD-10-CM | POA: Diagnosis not present

## 2019-07-03 DIAGNOSIS — Z79899 Other long term (current) drug therapy: Secondary | ICD-10-CM | POA: Insufficient documentation

## 2019-07-03 DIAGNOSIS — W01198A Fall on same level from slipping, tripping and stumbling with subsequent striking against other object, initial encounter: Secondary | ICD-10-CM | POA: Insufficient documentation

## 2019-07-03 DIAGNOSIS — J449 Chronic obstructive pulmonary disease, unspecified: Secondary | ICD-10-CM | POA: Insufficient documentation

## 2019-07-03 DIAGNOSIS — F17200 Nicotine dependence, unspecified, uncomplicated: Secondary | ICD-10-CM | POA: Insufficient documentation

## 2019-07-03 DIAGNOSIS — E1165 Type 2 diabetes mellitus with hyperglycemia: Secondary | ICD-10-CM

## 2019-07-03 DIAGNOSIS — R519 Headache, unspecified: Secondary | ICD-10-CM

## 2019-07-03 DIAGNOSIS — Y929 Unspecified place or not applicable: Secondary | ICD-10-CM | POA: Insufficient documentation

## 2019-07-03 DIAGNOSIS — Y9389 Activity, other specified: Secondary | ICD-10-CM | POA: Insufficient documentation

## 2019-07-03 DIAGNOSIS — G40901 Epilepsy, unspecified, not intractable, with status epilepticus: Secondary | ICD-10-CM | POA: Diagnosis not present

## 2019-07-03 DIAGNOSIS — Z794 Long term (current) use of insulin: Secondary | ICD-10-CM | POA: Insufficient documentation

## 2019-07-03 DIAGNOSIS — R451 Restlessness and agitation: Secondary | ICD-10-CM | POA: Insufficient documentation

## 2019-07-03 DIAGNOSIS — I10 Essential (primary) hypertension: Secondary | ICD-10-CM | POA: Insufficient documentation

## 2019-07-03 LAB — BLOOD GAS, VENOUS
Acid-Base Excess: 1 mmol/L (ref 0.0–2.0)
Bicarbonate: 27.1 mmol/L (ref 20.0–28.0)
O2 Saturation: 66.2 %
Patient temperature: 37
pCO2, Ven: 48 mmHg (ref 44.0–60.0)
pH, Ven: 7.36 (ref 7.250–7.430)
pO2, Ven: 36 mmHg (ref 32.0–45.0)

## 2019-07-03 LAB — URINALYSIS, COMPLETE (UACMP) WITH MICROSCOPIC
Bacteria, UA: NONE SEEN
Bilirubin Urine: NEGATIVE
Glucose, UA: >=500 mg/dL — AB
Ketones, ur: NEGATIVE mg/dL
Leukocytes,Ua: NEGATIVE
Nitrite: NEGATIVE
Protein, ur: NEGATIVE mg/dL
Specific Gravity, Urine: 1.017 (ref 1.005–1.030)
pH: 6 (ref 5.0–8.0)

## 2019-07-03 LAB — BASIC METABOLIC PANEL
Anion gap: 12 (ref 5–15)
BUN: 28 mg/dL — ABNORMAL HIGH (ref 6–20)
CO2: 23 mmol/L (ref 22–32)
Calcium: 8.9 mg/dL (ref 8.9–10.3)
Chloride: 93 mmol/L — ABNORMAL LOW (ref 98–111)
Creatinine, Ser: 1.71 mg/dL — ABNORMAL HIGH (ref 0.61–1.24)
GFR calc Af Amer: 51 mL/min — ABNORMAL LOW (ref >60)
GFR calc non Af Amer: 44 mL/min — ABNORMAL LOW (ref >60)
Glucose, Bld: 443 mg/dL — ABNORMAL HIGH (ref 70–99)
Potassium: 4 mmol/L (ref 3.5–5.1)
Sodium: 128 mmol/L — ABNORMAL LOW (ref 135–145)

## 2019-07-03 LAB — RETICULOCYTES
Immature Retic Fract: 27.1 % — ABNORMAL HIGH (ref 2.3–15.9)
RBC.: 5.2 MIL/uL (ref 4.22–5.81)
Retic Count, Absolute: 162.8 10*3/uL (ref 19.0–186.0)
Retic Ct Pct: 3.1 % (ref 0.4–3.1)

## 2019-07-03 LAB — GLUCOSE, CAPILLARY
Glucose-Capillary: 402 mg/dL — ABNORMAL HIGH (ref 70–99)
Glucose-Capillary: 420 mg/dL — ABNORMAL HIGH (ref 70–99)
Glucose-Capillary: 540 mg/dL (ref 70–99)

## 2019-07-03 MED ORDER — IBUPROFEN 600 MG PO TABS
600.0000 mg | ORAL_TABLET | ORAL | Status: AC
  Administered 2019-07-03: 600 mg via ORAL
  Filled 2019-07-03: qty 1

## 2019-07-03 MED ORDER — INSULIN ASPART 100 UNIT/ML ~~LOC~~ SOLN
6.0000 [IU] | Freq: Once | SUBCUTANEOUS | Status: AC
  Administered 2019-07-03: 6 [IU] via SUBCUTANEOUS
  Filled 2019-07-03: qty 1

## 2019-07-03 MED ORDER — SODIUM CHLORIDE 0.9% FLUSH: 10.0000 mL | INTRAVENOUS | Status: DC | PRN

## 2019-07-03 MED ORDER — SODIUM CHLORIDE 0.9 % IV BOLUS
1000.0000 mL | Freq: Once | INTRAVENOUS | Status: AC
  Administered 2019-07-03: 1000 mL via INTRAVENOUS

## 2019-07-03 NOTE — ED Notes (Signed)
Pt is still verbally aggressive. RN went to go test blood sugar and pt stated he needed to urinate. RN told pt to give RN just a minute to get urinal and pt started to yell and curse at RN. Provider notified of pts blood sugar of 402

## 2019-07-03 NOTE — ED Notes (Addendum)
Midline does not flush or infuse. Provider notified. CT called for scan. Pt states he is willing and able to lay on back. Pt states he cannot see, but is still yelling and cussing at times. Provider notified pt is saying he cannot see

## 2019-07-03 NOTE — ED Notes (Signed)
Pt started to yell and curse at IV nurse. This RN went to see and this RN told pt that he needs to stop yelling and cursing. IV nurse informed me that pt is not being cooperative and refused the vein she wanted to use in the bicep so she started to place one in the right [facility], but pt told her to remove it and try in the upper arm.

## 2019-07-03 NOTE — ED Notes (Signed)
RN attempted to get blood work and an IV. Patient allowed RN to look, but then became extremely agitated. Pt started to yell and say he was in pain and is seeing colors. RN informed pt he gave him the motrin for the pain. Pt yelled at nurse saying "Dont patrinize me. I know you are and you do it to others." RN took off tourniquet and then pt said, just try to do an IV, you are going to do whatever you want anyway's. RN took off tourniquet and he yelled, just try to do an IV. RN attempted, but was unable to.  Pt took off cardiac, and pulse ox monitoring. Provider notified.  IV team at bedside to assist with IV and blood work.

## 2019-07-03 NOTE — ED Provider Notes (Signed)
[facility] Emergency Department Provider Note ____________________________________________   First MD Initiated Contact with Patient 07/03/19 2053     (approximate)  I have reviewed the triage vital signs and the nursing notes.  HISTORY  Chief Complaint Hyperglycemia and Fall  EM caveat: Patient rather abrasive, does not wish to comply with all portions of history  HPI John Haas is a 54 y.o. male history of diabetes and COPD previous MI, sickle cell  Patient here for evaluation reports that for about 5 days has been having a severe throbbing right-sided headache.  He feels like he has a sinus infection which he says caused pain like this in the past  Patient reports that he has been having pain like this for several days, but it is right-sided involves the right side of his face.  Reports he went to [facility] yesterday for the same, he was evaluated there and he tells me that they had thought about admitting him but then he acted out and they did not want to admit him.  Reports today he had a fall, he was standing and because of the headache he reports that he fell to the side and struck his head on the side of a wall.  Reports he is compliant with his Suboxone therapy  He has not had his insulin today because his headache is causing him not to want to take his medication  Patient reports to me that he was seen at [facility] ER and noted same issues to them regarding his headache also having some vision change seeing some spots occasionally around the edges of his right visual field and I off and on.  Reports they did a test at [facility]  Past Medical History:  Diagnosis Date  . COPD (chronic obstructive pulmonary disease) (HCC)   . Diabetes mellitus without complication (HCC)   . Heart attack (HCC)   . Hypertension   . 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 Sickle cell anemia (HCC)     There are no  problems to display for this patient.   Past Surgical History:  Procedure Laterality Date  . ANKLE SURGERY    . KNEE SURGERY    . PORT-A-CATH REMOVAL      Prior to Admission medications   Medication Sig Start Date End Date Taking? Authorizing Provider  amoxicillin (AMOXIL) 500 MG capsule Take 1 capsule (500 mg total) by mouth 3 (three) times daily. 06/27/18   Enid Derry, PA-C  gabapentin (NEURONTIN) 300 MG capsule Take 1,200 mg by mouth 3 (three) times daily. 01/04/19   [provider]  Insulin Glargine (BASAGLAR KWIKPEN) 100 UNIT/ML SOPN Inject 35 Units into the skin 2 (two) times daily. 04/04/19   [provider]  lidocaine (XYLOCAINE) 2 % solution Use as directed 10 mLs in the mouth or throat as needed. 06/27/18   Enid Derry, PA-C  tamsulosin (FLOMAX) 0.4 MG CAPS capsule Take 0.4 mg by mouth daily. 02/11/19   [provider]    Allergies Ketamine, Nubain [nalbuphine hcl], and Stadol [butorphanol]  History reviewed. No pertinent family history.  Social History Social History   Tobacco Use  . Smoking status: Current Every Day Smoker  . Smokeless tobacco: Never Used  Substance Use Topics  . Alcohol use: No  . Drug use: No    Review of Systems Constitutional: No fever/chills Eyes: Reports he is feels like his vision in the right eye is a  bit blurry seeing spots at times ENT: No sore throat.  Denies neck pain Cardiovascular: Denies chest pain. Respiratory: Denies shortness of breath. Gastrointestinal: No abdominal pain.   Genitourinary: Negative for dysuria. Musculoskeletal: Negative for back pain.  Reports chronic pain and discomfort in an ulcer that being followed by [facility] over his right heel Skin: Negative for rash. Neurological: Negative for  areas of focal weakness or numbness.    ____________________________________________   PHYSICAL EXAM:  VITAL SIGNS: ED Triage Vitals  Enc Vitals Group     BP 07/03/19 1738 123/72     Pulse  Rate 07/03/19 1738 (!) 101     Resp 07/03/19 1738 (!) 22     Temp 07/03/19 1738 98.5 F (36.9 C)     Temp Source 07/03/19 1738 Oral     SpO2 07/03/19 1738 95 %     Weight 07/03/19 1739 (!) 330 lb (149.7 kg)     Height 07/03/19 1739 6\' 3"  (1.905 m)     Head Circumference --      Peak Flow --      Pain Score 07/03/19 1738 10     Pain Loc --      Pain Edu? --      Excl. in GC? --     Constitutional: Alert and oriented.  Sitting up, he is rather abrupt and abrasive.  Frequently cursing, from the first minute entered the room he is very somewhat upset frequently discussing how his doctors at [facility] have labeled him that he only uses medicine as prescribed. eyes: Conjunctivae are normal. Head: Atraumatic. Nose: No congestion/rhinnorhea. Mouth/Throat: Mucous membranes are moist. Neck: No stridor.  No meningismus Cardiovascular: Normal rate, regular rhythm. Grossly normal heart sounds.  Good peripheral circulation. Respiratory: Normal respiratory effort.  No retractions. Lungs CTAB. Gastrointestinal: Soft and nontender. No distention. Musculoskeletal: No lower extremity tenderness though he does have a dry based ulcer over the plantar surface of the right foot that does not show any abnormal odor drainage or surrounding erythema.  Reports this is been present for some time.  Followed by [facility] for Neurologic:  Normal speech and language. No gross focal neurologic deficits are appreciated.  Skin:  Skin is warm, dry and intact. No rash noted. Psychiatric: Mood and affect are elevated, somewhat agitated, he is not disoriented though.  No evidence of delirium.  He is frequently cursing  ____________________________________________   LABS (all labs ordered are listed, but only abnormal results are displayed)  Labs Reviewed  BASIC METABOLIC PANEL - Abnormal; Notable for the following components:      Result Value   Sodium 128 (*)    Chloride 93 (*)    Glucose, Bld 443 (*)    BUN 28 (*)     Creatinine, Ser 1.71 (*)    GFR calc non Af Amer 44 (*)    GFR calc Af Amer 51 (*)    All other components within normal limits  URINALYSIS, COMPLETE (UACMP) WITH MICROSCOPIC - Abnormal; Notable for the following components:   Color, Urine STRAW (*)    APPearance CLEAR (*)    Glucose, UA >=500 (*)    Hgb urine dipstick SMALL (*)    All other components within normal limits  GLUCOSE, CAPILLARY - Abnormal; Notable for the following components:   Glucose-Capillary 540 (*)    All other components within normal limits  RETICULOCYTES - Abnormal; Notable for the following components:   Immature Retic Fract 27.1 (*)    All other components  within normal limits  GLUCOSE, CAPILLARY - Abnormal; Notable for the following components:   Glucose-Capillary 420 (*)    All other components within normal limits  GLUCOSE, CAPILLARY - Abnormal; Notable for the following components:   Glucose-Capillary 402 (*)    All other components within normal limits  BLOOD GAS, VENOUS  CBC WITH DIFFERENTIAL/PLATELET  CBG MONITORING, ED  CBG MONITORING, ED  POC URINE PREG, ED  POC URINE PREG, ED  POC URINE PREG, ED  POC URINE PREG, ED  POC URINE PREG, ED  POC URINE PREG, ED   ____________________________________________  EKG   ____________________________________________  RADIOLOGY  CT Head Wo Contrast  Result Date: 07/03/2019 CLINICAL DATA:  Recent fall with headaches EXAM: CT HEAD WITHOUT CONTRAST TECHNIQUE: Contiguous axial images were obtained from the base of the skull through the vertex without intravenous contrast. COMPARISON:  None. FINDINGS: Brain: Mild atrophic changes are noted. Areas of prior ischemia are seen in the frontal lobes bilaterally. No findings to suggest acute hemorrhage, acute infarction or space-occupying mass lesion are seen. Vascular: No hyperdense vessel or unexpected calcification. Skull: Normal. Negative for fracture or focal lesion. Sinuses/Orbits: No acute finding. Other:  None. IMPRESSION: Mild atrophic changes and areas of prior ischemia without acute abnormality. Electronically Signed   By: Alcide Clever M.D.   On: 07/03/2019 23:13    No acute findings on CT head. (Areas of prior ischemia).  ____________________________________________   PROCEDURES  Procedure(s) performed: None  Procedures  Critical Care performed: No  ____________________________________________   INITIAL IMPRESSION / ASSESSMENT AND PLAN / ED COURSE  Pertinent labs & imaging results that were available during my care of the patient were reviewed by me and considered in my medical decision making (see chart for details).   Patient here for evaluation of 4 to 5 days of right-sided sinus-like headache.  Patient reports that he was seen at [facility] for the same yesterday, not able to access this record at this time via care everywhere for that visit.  He does not appear to have evidence of acute neurologic deficit.  Visual fields intact on my examination.  He reports that he cannot see intermittently throughout his ED course, but at the same time he is able to clearly discern me in conversation and reach for objects and grabbed them without difficulty.  His presentation seems very unusual, and he reports that he was seen and evaluated ER at [facility] yesterday for same.  At this point, I do not have any clear compelling evidence of any neurologic deficit, his testing here objectively does not show evidence of deficit.  He denies any symptoms of sickle cell crisis such as chest pain or extremity pain except for in his right foot which appears to be chronic with a slowly healing dry ulcer  Patient with significant verbal outbursts agitation, cursing frequently in the ER.  Somewhat difficult to provide care to him as he seems to dictate that his care is dependent on receiving pain medications.  He was excepting of ibuprofen, and did notice that he is notably hyperglycemic likely due to insulin  noncompliance at home.  ----------------------------------------- 12:13 AM on 07/04/2019 -----------------------------------------  CT imaging reassuring, referred to neurology for further evaluation but patient does have a known history of sickle disease.  Does not have evidence of acute sickle crisis at this time.  Goal at this point, with patient now resting quite comfortably laying on his side without distress, is to improve his glucose but does not show  evidence of DKA.  Ongoing care signed Dr. Karma Greaser, follow-up on improvement in hyperglycemia, likely discharge to follow-up with his physician team at [facility] and neurology thereafter      ____________________________________________   FINAL CLINICAL IMPRESSION(S) / ED DIAGNOSES  Final diagnoses:  Nonintractable headache, unspecified chronicity pattern, unspecified headache type  Hyperglycemia due to diabetes mellitus [facility])        Note:  This document was prepared using Dragon voice recognition software and may include unintentional dictation errors       Delman Kitten, MD 07/04/19 [phone]

## 2019-07-03 NOTE — ED Triage Notes (Signed)
First nurse note- here for critical high blood sugar via EMS. Pt pulled for triage.

## 2019-07-03 NOTE — ED Triage Notes (Signed)
Assisted to bathroom by NT 

## 2019-07-03 NOTE — ED Triage Notes (Signed)
Pt brought in by [facility] for a fall against the wall. A neighbor heard him yelling. Pt states he did not fall to the ground. States he is a type 2 DM, takes insulin. States CBG read HI. Pt states it's been HI for 4 days. Pt c/o of headache for 5 days. Crying in triage because of head pain.

## 2019-07-03 NOTE — ED Triage Notes (Signed)
Pt yelling and cursing. Informed pt that he needs to stop cursing and yelling in the lobby, there are other patients. Pt aware of what he is waiting on. Had refused labs with triage RN

## 2019-07-03 NOTE — ED Notes (Signed)
This RN stuck pt once and vein blew. Pt wanted this RN to stick a vein in his shoulder/axilla which was very squigly and not straight. This RN stated that would not hold an IV. Pt then refused to be stuck anywhere else. Informed pt that IV team consult would be placed.   Pt also states he was at [facility] yesterday for same.  This RN assisted pt to toilet because he yelled out "I have to pee". Pt VERY unsteady on feet while urinating. This RN stayed with pt in the bathroom to make sure pt didn't fall. Pt placed in wheelchair and placed in lobby to wait for IV team.

## 2019-07-03 NOTE — ED Notes (Addendum)
Pt is on cardiac, bp and pulse ox monitoring. Pt is yelling, screaming, and rolling in bed. Pt is using curse words. RN informed him that a provider will come and see him shortly and then medications can be given if prescribed.  Pt states he was seen at [facility] yesterday and got a migraine cocktail and it did not help. Pt states he believes it is a sinus infection.

## 2019-07-04 ENCOUNTER — Emergency Department: Payer: Medicare Other

## 2019-07-04 ENCOUNTER — Encounter: Payer: Self-pay | Admitting: Medical Oncology

## 2019-07-04 ENCOUNTER — Inpatient Hospital Stay
Admission: EM | Admit: 2019-07-04 | Discharge: 2019-07-13 | DRG: 100 | Disposition: A | Discharge status: Other Acute Inpatient Hospital | Payer: Medicare Other | Attending: Internal Medicine | Admitting: Internal Medicine

## 2019-07-04 DIAGNOSIS — Z20822 Contact with and (suspected) exposure to covid-19: Secondary | ICD-10-CM | POA: Diagnosis present

## 2019-07-04 DIAGNOSIS — G40901 Epilepsy, unspecified, not intractable, with status epilepticus: Principal | ICD-10-CM | POA: Diagnosis present

## 2019-07-04 DIAGNOSIS — H499 Unspecified paralytic strabismus: Secondary | ICD-10-CM | POA: Diagnosis present

## 2019-07-04 DIAGNOSIS — J209 Acute bronchitis, unspecified: Secondary | ICD-10-CM

## 2019-07-04 DIAGNOSIS — I129 Hypertensive chronic kidney disease with stage 1 through stage 4 chronic kidney disease, or unspecified chronic kidney disease: Secondary | ICD-10-CM | POA: Diagnosis present

## 2019-07-04 DIAGNOSIS — I252 Old myocardial infarction: Secondary | ICD-10-CM

## 2019-07-04 DIAGNOSIS — G43909 Migraine, unspecified, not intractable, without status migrainosus: Secondary | ICD-10-CM | POA: Diagnosis present

## 2019-07-04 DIAGNOSIS — J9600 Acute respiratory failure, unspecified whether with hypoxia or hypercapnia: Secondary | ICD-10-CM

## 2019-07-04 DIAGNOSIS — J9601 Acute respiratory failure with hypoxia: Secondary | ICD-10-CM | POA: Diagnosis not present

## 2019-07-04 DIAGNOSIS — E871 Hypo-osmolality and hyponatremia: Secondary | ICD-10-CM | POA: Diagnosis present

## 2019-07-04 DIAGNOSIS — R519 Headache, unspecified: Secondary | ICD-10-CM

## 2019-07-04 DIAGNOSIS — E1122 Type 2 diabetes mellitus with diabetic chronic kidney disease: Secondary | ICD-10-CM | POA: Diagnosis present

## 2019-07-04 DIAGNOSIS — Z1388 Encounter for screening for disorder due to exposure to contaminants: Secondary | ICD-10-CM

## 2019-07-04 DIAGNOSIS — F17210 Nicotine dependence, cigarettes, uncomplicated: Secondary | ICD-10-CM | POA: Diagnosis present

## 2019-07-04 DIAGNOSIS — Z885 Allergy status to narcotic agent status: Secondary | ICD-10-CM

## 2019-07-04 DIAGNOSIS — G9340 Encephalopathy, unspecified: Secondary | ICD-10-CM

## 2019-07-04 DIAGNOSIS — N182 Chronic kidney disease, stage 2 (mild): Secondary | ICD-10-CM | POA: Diagnosis present

## 2019-07-04 DIAGNOSIS — G4733 Obstructive sleep apnea (adult) (pediatric): Secondary | ICD-10-CM | POA: Diagnosis present

## 2019-07-04 DIAGNOSIS — Z0189 Encounter for other specified special examinations: Secondary | ICD-10-CM

## 2019-07-04 DIAGNOSIS — F1410 Cocaine abuse, uncomplicated: Secondary | ICD-10-CM | POA: Diagnosis present

## 2019-07-04 DIAGNOSIS — G9341 Metabolic encephalopathy: Secondary | ICD-10-CM | POA: Diagnosis present

## 2019-07-04 DIAGNOSIS — L97519 Non-pressure chronic ulcer of other part of right foot with unspecified severity: Secondary | ICD-10-CM | POA: Diagnosis present

## 2019-07-04 DIAGNOSIS — Z794 Long term (current) use of insulin: Secondary | ICD-10-CM

## 2019-07-04 DIAGNOSIS — D72828 Other elevated white blood cell count: Secondary | ICD-10-CM

## 2019-07-04 DIAGNOSIS — G9608 Other cranial cerebrospinal fluid leak: Secondary | ICD-10-CM | POA: Diagnosis present

## 2019-07-04 DIAGNOSIS — Z452 Encounter for adjustment and management of vascular access device: Secondary | ICD-10-CM

## 2019-07-04 DIAGNOSIS — Z4659 Encounter for fitting and adjustment of other gastrointestinal appliance and device: Secondary | ICD-10-CM

## 2019-07-04 DIAGNOSIS — E11621 Type 2 diabetes mellitus with foot ulcer: Secondary | ICD-10-CM | POA: Diagnosis present

## 2019-07-04 DIAGNOSIS — B004 Herpesviral encephalitis: Secondary | ICD-10-CM | POA: Diagnosis present

## 2019-07-04 DIAGNOSIS — D57459 Sickle-cell thalassemia beta plus with crisis, unspecified: Secondary | ICD-10-CM | POA: Diagnosis present

## 2019-07-04 DIAGNOSIS — R9389 Abnormal findings on diagnostic imaging of other specified body structures: Secondary | ICD-10-CM

## 2019-07-04 DIAGNOSIS — J9602 Acute respiratory failure with hypercapnia: Secondary | ICD-10-CM | POA: Diagnosis not present

## 2019-07-04 DIAGNOSIS — Z79899 Other long term (current) drug therapy: Secondary | ICD-10-CM

## 2019-07-04 DIAGNOSIS — Z978 Presence of other specified devices: Secondary | ICD-10-CM

## 2019-07-04 DIAGNOSIS — D5740 Sickle-cell thalassemia without crisis: Secondary | ICD-10-CM

## 2019-07-04 DIAGNOSIS — R296 Repeated falls: Secondary | ICD-10-CM | POA: Diagnosis present

## 2019-07-04 DIAGNOSIS — R569 Unspecified convulsions: Secondary | ICD-10-CM

## 2019-07-04 DIAGNOSIS — G039 Meningitis, unspecified: Secondary | ICD-10-CM

## 2019-07-04 DIAGNOSIS — T1490XA Injury, unspecified, initial encounter: Secondary | ICD-10-CM

## 2019-07-04 DIAGNOSIS — E870 Hyperosmolality and hypernatremia: Secondary | ICD-10-CM | POA: Diagnosis not present

## 2019-07-04 DIAGNOSIS — R410 Disorientation, unspecified: Secondary | ICD-10-CM

## 2019-07-04 DIAGNOSIS — G894 Chronic pain syndrome: Principal | ICD-10-CM | POA: Diagnosis present

## 2019-07-04 DIAGNOSIS — Z8249 Family history of ischemic heart disease and other diseases of the circulatory system: Secondary | ICD-10-CM

## 2019-07-04 DIAGNOSIS — E1165 Type 2 diabetes mellitus with hyperglycemia: Secondary | ICD-10-CM | POA: Diagnosis present

## 2019-07-04 DIAGNOSIS — R1110 Vomiting, unspecified: Secondary | ICD-10-CM

## 2019-07-04 DIAGNOSIS — Z596 Low income: Secondary | ICD-10-CM

## 2019-07-04 DIAGNOSIS — F1120 Opioid dependence, uncomplicated: Secondary | ICD-10-CM | POA: Diagnosis present

## 2019-07-04 DIAGNOSIS — I169 Hypertensive crisis, unspecified: Secondary | ICD-10-CM | POA: Diagnosis present

## 2019-07-04 DIAGNOSIS — F19239 Other psychoactive substance dependence with withdrawal, unspecified: Secondary | ICD-10-CM | POA: Diagnosis present

## 2019-07-04 DIAGNOSIS — G40909 Epilepsy, unspecified, not intractable, without status epilepticus: Secondary | ICD-10-CM

## 2019-07-04 DIAGNOSIS — J440 Chronic obstructive pulmonary disease with acute lower respiratory infection: Secondary | ICD-10-CM | POA: Diagnosis present

## 2019-07-04 DIAGNOSIS — Z888 Allergy status to other drugs, medicaments and biological substances status: Secondary | ICD-10-CM

## 2019-07-04 DIAGNOSIS — E119 Type 2 diabetes mellitus without complications: Secondary | ICD-10-CM

## 2019-07-04 LAB — URINALYSIS, COMPLETE (UACMP) WITH MICROSCOPIC
Bacteria, UA: NONE SEEN
Bilirubin Urine: NEGATIVE
Glucose, UA: >=500 mg/dL — AB
Ketones, ur: NEGATIVE mg/dL
Leukocytes,Ua: NEGATIVE
Nitrite: NEGATIVE
Protein, ur: NEGATIVE mg/dL
Specific Gravity, Urine: 1.014 (ref 1.005–1.030)
Squamous Epithelial / HPF: NONE SEEN (ref 0–5)
pH: 5 (ref 5.0–8.0)

## 2019-07-04 LAB — URINE DRUG SCREEN, QUALITATIVE (ARMC ONLY)
Amphetamines, Ur Screen: NOT DETECTED
Barbiturates, Ur Screen: NOT DETECTED
Benzodiazepine, Ur Scrn: NOT DETECTED
Cannabinoid 50 Ng, Ur ~~LOC~~: NOT DETECTED
Cocaine Metabolite,Ur ~~LOC~~: NOT DETECTED
MDMA (Ecstasy)Ur Screen: NOT DETECTED
Methadone Scn, Ur: NOT DETECTED
Opiate, Ur Screen: POSITIVE — AB
Phencyclidine (PCP) Ur S: NOT DETECTED
Tricyclic, Ur Screen: NOT DETECTED

## 2019-07-04 LAB — CBC WITH DIFFERENTIAL/PLATELET
Abs Immature Granulocytes: 0.13 10*3/uL — ABNORMAL HIGH (ref 0.00–0.07)
Abs Immature Granulocytes: 0.17 10*3/uL — ABNORMAL HIGH (ref 0.00–0.07)
Basophils Absolute: 0.1 10*3/uL (ref 0.0–0.1)
Basophils Absolute: 0.1 10*3/uL (ref 0.0–0.1)
Basophils Relative: 0 %
Basophils Relative: 0 %
Eosinophils Absolute: 0 10*3/uL (ref 0.0–0.5)
Eosinophils Absolute: 0 10*3/uL (ref 0.0–0.5)
Eosinophils Relative: 0 %
Eosinophils Relative: 0 %
HCT: 33.8 % — ABNORMAL LOW (ref 39.0–52.0)
HCT: 35 % — ABNORMAL LOW (ref 39.0–52.0)
Hemoglobin: 11.1 g/dL — ABNORMAL LOW (ref 13.0–17.0)
Hemoglobin: 11.6 g/dL — ABNORMAL LOW (ref 13.0–17.0)
Immature Granulocytes: 1 %
Immature Granulocytes: 1 %
Lymphocytes Relative: 21 %
Lymphocytes Relative: 14 %
Lymphs Abs: 4.1 10*3/uL — ABNORMAL HIGH (ref 0.7–4.0)
Lymphs Abs: 3.3 10*3/uL (ref 0.7–4.0)
MCH: 21.6 pg — ABNORMAL LOW (ref 26.0–34.0)
MCH: 21.8 pg — ABNORMAL LOW (ref 26.0–34.0)
MCHC: 32.8 g/dL (ref 30.0–36.0)
MCHC: 33.1 g/dL (ref 30.0–36.0)
MCV: 65.9 fL — ABNORMAL LOW (ref 80.0–100.0)
MCV: 65.8 fL — ABNORMAL LOW (ref 80.0–100.0)
Monocytes Absolute: 1.7 10*3/uL — ABNORMAL HIGH (ref 0.1–1.0)
Monocytes Absolute: 2.1 10*3/uL — ABNORMAL HIGH (ref 0.1–1.0)
Monocytes Relative: 9 %
Monocytes Relative: 9 %
Neutro Abs: 13.6 10*3/uL — ABNORMAL HIGH (ref 1.7–7.7)
Neutro Abs: 17 10*3/uL — ABNORMAL HIGH (ref 1.7–7.7)
Neutrophils Relative %: 69 %
Neutrophils Relative %: 76 %
Platelets: 352 10*3/uL (ref 150–400)
Platelets: 352 10*3/uL (ref 150–400)
RBC: 5.13 MIL/uL (ref 4.22–5.81)
RBC: 5.32 MIL/uL (ref 4.22–5.81)
RDW: 15.8 % — ABNORMAL HIGH (ref 11.5–15.5)
RDW: 15.9 % — ABNORMAL HIGH (ref 11.5–15.5)
WBC: 19.7 10*3/uL — ABNORMAL HIGH (ref 4.0–10.5)
WBC: 22.5 10*3/uL — ABNORMAL HIGH (ref 4.0–10.5)
nRBC: 2.2 % — ABNORMAL HIGH (ref 0.0–0.2)
nRBC: 2 % — ABNORMAL HIGH (ref 0.0–0.2)

## 2019-07-04 LAB — RETICULOCYTES
Immature Retic Fract: 30 % — ABNORMAL HIGH (ref 2.3–15.9)
RBC.: 5.01 MIL/uL (ref 4.22–5.81)
Retic Count, Absolute: 148.3 10*3/uL (ref 19.0–186.0)
Retic Ct Pct: 3 % (ref 0.4–3.1)

## 2019-07-04 LAB — BLOOD GAS, VENOUS
Acid-base deficit: 2.4 mmol/L — ABNORMAL HIGH (ref 0.0–2.0)
Bicarbonate: 23.7 mmol/L (ref 20.0–28.0)
O2 Saturation: 81.1 %
Patient temperature: 37
pCO2, Ven: 45 mmHg (ref 44.0–60.0)
pH, Ven: 7.33 (ref 7.250–7.430)
pO2, Ven: 49 mmHg — ABNORMAL HIGH (ref 32.0–45.0)

## 2019-07-04 LAB — COMPREHENSIVE METABOLIC PANEL
ALT: 17 U/L (ref 0–44)
AST: 27 U/L (ref 15–41)
Albumin: 3.9 g/dL (ref 3.5–5.0)
Alkaline Phosphatase: 116 U/L (ref 38–126)
Anion gap: 11 (ref 5–15)
BUN: 22 mg/dL — ABNORMAL HIGH (ref 6–20)
CO2: 22 mmol/L (ref 22–32)
Calcium: 8.9 mg/dL (ref 8.9–10.3)
Chloride: 96 mmol/L — ABNORMAL LOW (ref 98–111)
Creatinine, Ser: 1.48 mg/dL — ABNORMAL HIGH (ref 0.61–1.24)
GFR calc Af Amer: >60 mL/min (ref >60)
GFR calc non Af Amer: 53 mL/min — ABNORMAL LOW (ref >60)
Glucose, Bld: 348 mg/dL — ABNORMAL HIGH (ref 70–99)
Potassium: 4 mmol/L (ref 3.5–5.1)
Sodium: 129 mmol/L — ABNORMAL LOW (ref 135–145)
Total Bilirubin: 1.6 mg/dL — ABNORMAL HIGH (ref 0.3–1.2)
Total Protein: 8.1 g/dL (ref 6.5–8.1)

## 2019-07-04 LAB — RESPIRATORY PANEL BY RT PCR (FLU A&B, COVID)
Influenza A by PCR: NEGATIVE
Influenza B by PCR: NEGATIVE
SARS Coronavirus 2 by RT PCR: NEGATIVE

## 2019-07-04 LAB — TROPONIN I (HIGH SENSITIVITY)
Troponin I (High Sensitivity): 21 ng/L — ABNORMAL HIGH (ref <18)
Troponin I (High Sensitivity): 19 ng/L — ABNORMAL HIGH (ref <18)

## 2019-07-04 LAB — LACTATE DEHYDROGENASE: LDH: 214 U/L — ABNORMAL HIGH (ref 98–192)

## 2019-07-04 LAB — BRAIN NATRIURETIC PEPTIDE: B Natriuretic Peptide: 63 pg/mL (ref 0.0–100.0)

## 2019-07-04 LAB — GLUCOSE, CAPILLARY: Glucose-Capillary: 318 mg/dL — ABNORMAL HIGH (ref 70–99)

## 2019-07-04 MED ORDER — HALOPERIDOL LACTATE 5 MG/ML IJ SOLN
2.5000 mg | Freq: Once | INTRAMUSCULAR | Status: AC
  Administered 2019-07-04: 2.5 mg via INTRAVENOUS
  Filled 2019-07-04: qty 1

## 2019-07-04 MED ORDER — ACETAMINOPHEN 500 MG PO TABS
1000.0000 mg | ORAL_TABLET | Freq: Once | ORAL | Status: AC
  Administered 2019-07-04: 1000 mg via ORAL
  Filled 2019-07-04: qty 2

## 2019-07-04 MED ORDER — SODIUM CHLORIDE 0.9 % IV SOLN
2.0000 g | Freq: Once | INTRAVENOUS | Status: AC
  Administered 2019-07-05: 2 g via INTRAVENOUS

## 2019-07-04 MED ORDER — IOHEXOL 350 MG/ML SOLN
75.0000 mL | Freq: Once | INTRAVENOUS | Status: AC | PRN
  Administered 2019-07-04: 75 mL via INTRAVENOUS

## 2019-07-04 MED ORDER — SODIUM CHLORIDE 0.9 % IV SOLN: Freq: Once | INTRAVENOUS | Status: AC

## 2019-07-04 MED ORDER — VANCOMYCIN HCL 2000 MG/400ML IV SOLN
2000.0000 mg | Freq: Once | INTRAVENOUS | Status: AC
  Administered 2019-07-04: 2000 mg via INTRAVENOUS
  Filled 2019-07-04: qty 400

## 2019-07-04 MED ORDER — BUPRENORPHINE HCL-NALOXONE HCL 8-2 MG SL SUBL
1.0000 | SUBLINGUAL_TABLET | Freq: Once | SUBLINGUAL | Status: AC
  Administered 2019-07-04: 1 via SUBLINGUAL
  Filled 2019-07-04: qty 1

## 2019-07-04 MED ORDER — LORAZEPAM 2 MG/ML IJ SOLN
2.0000 mg | Freq: Once | INTRAMUSCULAR | Status: AC
  Administered 2019-07-04: 2 mg via INTRAVENOUS
  Filled 2019-07-04: qty 1

## 2019-07-04 MED ORDER — SODIUM CHLORIDE 0.9 % IV SOLN
4000.0000 mg | Freq: Once | INTRAVENOUS | Status: AC
  Administered 2019-07-04: 22:00:00 4000 mg via INTRAVENOUS
  Filled 2019-07-04: qty 40

## 2019-07-04 MED ORDER — DEXAMETHASONE SODIUM PHOSPHATE 10 MG/ML IJ SOLN
10.0000 mg | Freq: Once | INTRAMUSCULAR | Status: AC
  Administered 2019-07-04: 10 mg via INTRAVENOUS
  Filled 2019-07-04: qty 1

## 2019-07-04 MED ORDER — IOHEXOL 350 MG/ML SOLN: 125.0000 mL | Freq: Once | INTRAVENOUS | Status: DC | PRN

## 2019-07-04 MED ORDER — DIPHENHYDRAMINE HCL 50 MG/ML IJ SOLN
25.0000 mg | Freq: Once | INTRAMUSCULAR | Status: AC
  Administered 2019-07-04: 25 mg via INTRAVENOUS
  Filled 2019-07-04: qty 1

## 2019-07-04 NOTE — ED Notes (Signed)
Vanc paused to run keppra. Pt very difficult stick with single IV access point in left forearm. Will restart when keppra finished.

## 2019-07-04 NOTE — ED Notes (Signed)
Pt found to be standing up next to the bed. Pt states "I am weak, I need food and gingerale. You don't understand, my sugar is going to be high, but I am sick and my body is different when my sugar is high, I am hypoglycemic." Provider notified.  Pt is still being load and verbally aggressive and abusive towards staff.

## 2019-07-04 NOTE — ED Notes (Signed)
Pt in to room 6- this RN in to help pt get into bed. Pt begins to shake all over and while shaking states that we need to stop shaking him. Pt also being verbally abusive, stating "you dont give a fuck about me, you have no compassion, I need my suboxone. Im not here for narcotics no matter what my chart says"

## 2019-07-04 NOTE — ED Notes (Signed)
Pt taken to CT.

## 2019-07-04 NOTE — ED Notes (Signed)
Unable to obtain VBG, MD made aware and okay to put on hold until other labs have resulted.

## 2019-07-04 NOTE — ED Triage Notes (Signed)
Patient presents to the ED via EMS from home after several falls.  Patient's cbg was greater than 400 with EMS.  Patient told EMS he has not used his insulin in 24 hours.  Patient told this RN he needed to urinate.  This RN took patient to the bathroom with a urinal.  Patient refused to urinate sitting down but as he attempted to stand, patient's eyes began to deviate to the left and patient became very weak.  Patient was unable to hold the urinal to urinate but was angry when staff attempted to hold urinal for patient.  Patient answering questions appropriately but not following commands.  Patient appears lethargic in triage.

## 2019-07-04 NOTE — ED Provider Notes (Signed)
[facility] Emergency Department Provider Note  ____________________________________________   First MD Initiated Contact with Patient 07/04/19 1717     (approximate)  I have reviewed the triage vital signs and the nursing notes.   HISTORY  Chief Complaint Fall, Headache, Eye Problem, and Hyperglycemia    HPI [name] is a 54 y.o. male  Withh/o COPD, DM, sickle cell disease, here with multiple complaints.  Pt arrives agitated, yelling at staff. He tells me he is here because he has a headache, has been confused, has high blood sugars, has pain "all over," and needs suboxone. He says he has been "confused" for days but this is somewhat unclear as to what he means. No focal neuro deficits. When asked about his chest pain, he says it hurts "everywhere." No overt SOB. Of note, while moving to a chair, the pt began looking to the left and irregularly shaking his left arm and leg. However, he was able to answer questions appropriately despite both sides shaking then asked for something for pain.   Reviewed prior ED visits, notes from yesterday as well. H/o chronic pain and recurrent ED visits. Was hyperglycemia w/o DKA.        Past Medical History:  Diagnosis Date  . COPD (chronic obstructive pulmonary disease) (HCC)   . Diabetes mellitus without complication (HCC)   . Heart attack (HCC)   . Hypertension   . 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 Sickle cell anemia (HCC)     There are no problems to display for this patient.   Past Surgical History:  Procedure Laterality Date  . ANKLE SURGERY    . KNEE SURGERY    . PORT-A-CATH REMOVAL      Prior to Admission medications   Medication Sig Start Date End Date Taking? Authorizing Provider  amoxicillin (AMOXIL) 500 MG capsule Take 1 capsule (500 mg total) by mouth 3 (three) times daily. 06/27/18   Enid DerryWagner, Ashley, PA-C  gabapentin (NEURONTIN) 300 MG  capsule Take 1,200 mg by mouth 3 (three) times daily. 01/04/19   [provider]  Insulin Glargine (BASAGLAR KWIKPEN) 100 UNIT/ML SOPN Inject 35 Units into the skin 2 (two) times daily. 04/04/19   [provider]  lidocaine (XYLOCAINE) 2 % solution Use as directed 10 mLs in the mouth or throat as needed. 06/27/18   Enid DerryWagner, Ashley, PA-C  tamsulosin (FLOMAX) 0.4 MG CAPS capsule Take 0.4 mg by mouth daily. 02/11/19   [provider]    Allergies Ketamine, Nubain [nalbuphine hcl], and Stadol [butorphanol]  No family history on file.  Social History Social History   Tobacco Use  . Smoking status: Current Every Day Smoker  . Smokeless tobacco: Never Used  Substance Use Topics  . Alcohol use: No  . Drug use: No    Review of Systems  Review of Systems  Constitutional: Positive for fatigue. Negative for chills and fever.  HENT: Positive for sore throat.   Eyes: Positive for visual disturbance.  Respiratory: Positive for chest tightness and shortness of breath.   Cardiovascular: Positive for chest pain.  Gastrointestinal: Positive for abdominal pain, nausea and vomiting.  Genitourinary: Negative for flank pain.  Musculoskeletal: Negative for neck pain.  Skin: Negative for rash and wound.  Allergic/Immunologic: Negative for immunocompromised state.  Neurological: Positive for dizziness and weakness. Negative for numbness.  Hematological: Does not bruise/bleed easily.  All other systems reviewed and are negative.  ____________________________________________  PHYSICAL EXAM:      VITAL SIGNS: ED Triage Vitals [07/04/19 1710]  Enc Vitals Group     BP (!) 145/98     Pulse Rate 91     Resp 20     Temp 98.5 F (36.9 C)     Temp Source Oral     SpO2 95 %     Weight      Height      Head Circumference      Peak Flow      Pain Score      Pain Loc      Pain Edu?      Excl. in GC?      Physical Exam Vitals and nursing note reviewed.    Constitutional:      General: He is not in acute distress.    Appearance: He is well-developed.  HENT:     Head: Normocephalic and atraumatic.  Eyes:     Conjunctiva/sclera: Conjunctivae normal.  Cardiovascular:     Rate and Rhythm: Normal rate and regular rhythm.     Heart sounds: Normal heart sounds. No murmur. No friction rub.  Pulmonary:     Effort: Pulmonary effort is normal. No respiratory distress.     Breath sounds: Normal breath sounds. No wheezing or rales.  Abdominal:     General: There is no distension.     Palpations: Abdomen is soft.     Tenderness: There is no abdominal tenderness.  Musculoskeletal:     Cervical back: Neck supple.  Skin:    General: Skin is warm.     Capillary Refill: Capillary refill takes less than 2 seconds.  Neurological:     Mental Status: He is alert.     Motor: No abnormal muscle tone.     Comments: Patient looking left with eyes but responds to threat. Jerking in upper and lower extremities b/l but will respond and follow commands during episode. Face is symmetric. Speech is clear.       ____________________________________________   LABS (all labs ordered are listed, but only abnormal results are displayed)  Labs Reviewed  CBC WITH DIFFERENTIAL/PLATELET - Abnormal; Notable for the following components:      Result Value   WBC 22.5 (*)    Hemoglobin 11.6 (*)    HCT 35.0 (*)    MCV 65.8 (*)    MCH 21.8 (*)    RDW 15.9 (*)    nRBC 2.0 (*)    Neutro Abs 17.0 (*)    Monocytes Absolute 2.1 (*)    Abs Immature Granulocytes 0.17 (*)    All other components within normal limits  COMPREHENSIVE METABOLIC PANEL - Abnormal; Notable for the following components:   Sodium 129 (*)    Chloride 96 (*)    Glucose, Bld 348 (*)    BUN 22 (*)    Creatinine, Ser 1.48 (*)    Total Bilirubin 1.6 (*)    GFR calc non Af Amer 53 (*)    All other components within normal limits  BLOOD GAS, VENOUS - Abnormal; Notable for the following components:    pO2, Ven 49.0 (*)    Acid-base deficit 2.4 (*)    All other components within normal limits  URINALYSIS, COMPLETE (UACMP) WITH MICROSCOPIC - Abnormal; Notable for the following components:   Color, Urine YELLOW (*)    APPearance CLEAR (*)    Glucose, UA >=500 (*)    Hgb urine dipstick SMALL (*)  All other components within normal limits  URINE DRUG SCREEN, QUALITATIVE ([facility] ONLY) - Abnormal; Notable for the following components:   Opiate, Ur Screen POSITIVE (*)    All other components within normal limits  RETICULOCYTES - Abnormal; Notable for the following components:   Immature Retic Fract 30.0 (*)    All other components within normal limits  LACTATE DEHYDROGENASE - Abnormal; Notable for the following components:   LDH 214 (*)    All other components within normal limits  TROPONIN I (HIGH SENSITIVITY) - Abnormal; Notable for the following components:   Troponin I (High Sensitivity) 21 (*)    All other components within normal limits  TROPONIN I (HIGH SENSITIVITY) - Abnormal; Notable for the following components:   Troponin I (High Sensitivity) 19 (*)    All other components within normal limits  RESPIRATORY PANEL BY RT PCR (FLU A&B, COVID)  CULTURE, BLOOD (ROUTINE X 2)  CULTURE, BLOOD (ROUTINE X 2)  BRAIN NATRIURETIC PEPTIDE  CBG MONITORING, ED    ____________________________________________  EKG: Normal sinus rhythm, VR 96. QRS 97, QTc 468. No acute ST elevations or depressions. No ischemia or infarct. ________________________________________  RADIOLOGY All imaging, including plain films, CT scans, and ultrasounds, independently reviewed by me, and interpretations confirmed via formal radiology reads.  ED MD interpretation:   CT head: No acute intracranial abnormalities.  Mild atrophic changes without acute abnormality.  Official radiology report(s): CT Angio Head W or Wo Contrast  Result Date: 07/04/2019 CLINICAL DATA:  Weakness, syncope. EXAM: CT ANGIOGRAPHY  HEAD AND NECK TECHNIQUE: Multidetector CT imaging of the head and neck was performed using the standard protocol during bolus administration of intravenous contrast. Multiplanar CT image reconstructions and MIPs were obtained to evaluate the vascular anatomy. Carotid stenosis measurements (when applicable) are obtained utilizing NASCET criteria, using the distal internal carotid diameter as the denominator. CONTRAST:  23mL OMNIPAQUE IOHEXOL 350 MG/ML SOLN COMPARISON:  None. FINDINGS: CT HEAD FINDINGS Brain: There is no mass, hemorrhage or extra-axial collection. The size and configuration of the ventricles and extra-axial CSF spaces are normal. 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 The brain parenchyma is normal. Skull: The visualized skull base, calvarium and extracranial soft tissues are normal. Sinuses/Orbits: No fluid levels or advanced mucosal thickening of the visualized paranasal sinuses. No mastoid or middle ear effusion. The orbits are normal. CTA NECK FINDINGS SKELETON: There is no bony spinal canal stenosis. No lytic or blastic lesion. OTHER NECK: Normal pharynx, larynx and major salivary glands. No cervical lymphadenopathy. Unremarkable thyroid gland. UPPER CHEST: No pneumothorax or pleural effusion. No nodules or masses. AORTIC ARCH: There is no calcific atherosclerosis of the aortic arch. There is no aneurysm, dissection or hemodynamically significant stenosis of the visualized portion of the aorta. Conventional 3 vessel aortic branching pattern. The visualized proximal subclavian arteries are widely patent. RIGHT CAROTID SYSTEM: Normal without aneurysm, dissection or stenosis. LEFT CAROTID SYSTEM: Normal without aneurysm, dissection or stenosis. VERTEBRAL ARTERIES: Codominant configuration. Both origins are clearly patent. There is no dissection, occlusion or flow-limiting stenosis to the skull base (V1-V3 segments). CTA HEAD FINDINGS POSTERIOR CIRCULATION: --Vertebral arteries: Normal V4  segments. --Posterior inferior cerebellar arteries (PICA): Patent origins from the vertebral arteries. --Anterior inferior cerebellar arteries (AICA): Patent origins from the basilar artery. --Basilar artery: Normal. --Superior cerebellar arteries: Normal. --Posterior cerebral arteries: Normal. Both originate from the basilar artery. Posterior communicating arteries (p-comm) are diminutive or absent. ANTERIOR CIRCULATION: --Intracranial internal carotid arteries: Normal. --Anterior cerebral arteries (ACA): Normal. Both A1 segments are  present. Patent anterior communicating artery (a-comm). --Middle cerebral arteries (MCA): Normal. VENOUS SINUSES: As permitted by contrast timing, patent. ANATOMIC VARIANTS: None Review of the MIP images confirms the above findings. IMPRESSION: 1. No emergent large vessel occlusion or high-grade stenosis. 2. 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 Electronically Signed   By: Deatra Robinson M.D.   On: 07/04/2019 21:25   CT Head Wo Contrast  Result Date: 07/04/2019 CLINICAL DATA:  54 year old male with history of headache. Multiple falls. Seizures. EXAM: CT HEAD WITHOUT CONTRAST TECHNIQUE: Contiguous axial images were obtained from the base of the skull through the vertex without intravenous contrast. COMPARISON:  Head CT 07/03/2019. FINDINGS: Brain: Patchy and confluent areas of decreased attenuation are noted throughout the deep and periventricular white matter of the cerebral hemispheres bilaterally, compatible with chronic microvascular ischemic disease. No evidence of acute infarction, hemorrhage, hydrocephalus, extra-axial collection or mass lesion/mass effect. Vascular: No hyperdense vessel or unexpected calcification. Skull: Normal. Negative for fracture or focal lesion. Sinuses/Orbits: No acute finding. Other: None. IMPRESSION: 1. No acute intracranial abnormalities. 2. Chronic microvascular ischemic changes in the cerebral white matter, as above. Electronically  Signed   By: Trudie Reed M.D.   On: 07/04/2019 18:13   CT Angio Neck W and/or Wo Contrast  Result Date: 07/04/2019 CLINICAL DATA:  Weakness, syncope. EXAM: CT ANGIOGRAPHY HEAD AND NECK TECHNIQUE: Multidetector CT imaging of the head and neck was performed using the standard protocol during bolus administration of intravenous contrast. Multiplanar CT image reconstructions and MIPs were obtained to evaluate the vascular anatomy. Carotid stenosis measurements (when applicable) are obtained utilizing NASCET criteria, using the distal internal carotid diameter as the denominator. CONTRAST:  76mL OMNIPAQUE IOHEXOL 350 MG/ML SOLN COMPARISON:  None. FINDINGS: CT HEAD FINDINGS Brain: There is no mass, hemorrhage or extra-axial collection. The size and configuration of the ventricles and extra-axial CSF spaces are normal. 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 The brain parenchyma is normal. Skull: The visualized skull base, calvarium and extracranial soft tissues are normal. Sinuses/Orbits: No fluid levels or advanced mucosal thickening of the visualized paranasal sinuses. No mastoid or middle ear effusion. The orbits are normal. CTA NECK FINDINGS SKELETON: There is no bony spinal canal stenosis. No lytic or blastic lesion. OTHER NECK: Normal pharynx, larynx and major salivary glands. No cervical lymphadenopathy. Unremarkable thyroid gland. UPPER CHEST: No pneumothorax or pleural effusion. No nodules or masses. AORTIC ARCH: There is no calcific atherosclerosis of the aortic arch. There is no aneurysm, dissection or hemodynamically significant stenosis of the visualized portion of the aorta. Conventional 3 vessel aortic branching pattern. The visualized proximal subclavian arteries are widely patent. RIGHT CAROTID SYSTEM: Normal without aneurysm, dissection or stenosis. LEFT CAROTID SYSTEM: Normal without aneurysm, dissection or stenosis. VERTEBRAL ARTERIES: Codominant configuration. Both origins are  clearly patent. There is no dissection, occlusion or flow-limiting stenosis to the skull base (V1-V3 segments). CTA HEAD FINDINGS POSTERIOR CIRCULATION: --Vertebral arteries: Normal V4 segments. --Posterior inferior cerebellar arteries (PICA): Patent origins from the vertebral arteries. --Anterior inferior cerebellar arteries (AICA): Patent origins from the basilar artery. --Basilar artery: Normal. --Superior cerebellar arteries: Normal. --Posterior cerebral arteries: Normal. Both originate from the basilar artery. Posterior communicating arteries (p-comm) are diminutive or absent. ANTERIOR CIRCULATION: --Intracranial internal carotid arteries: Normal. --Anterior cerebral arteries (ACA): Normal. Both A1 segments are present. Patent anterior communicating artery (a-comm). --Middle cerebral arteries (MCA): Normal. VENOUS SINUSES: As permitted by contrast timing, patent. ANATOMIC VARIANTS: None Review of the MIP images confirms the above findings. IMPRESSION: 1. No  emergent large vessel occlusion or high-grade stenosis. 2. 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 Electronically Signed   By: Deatra Robinson M.D.   On: 07/04/2019 21:25   DG Chest Portable 1 View  Result Date: 07/04/2019 CLINICAL DATA:  Chest pain EXAM: PORTABLE CHEST 1 VIEW COMPARISON:  10/04/2018, 12/13/2017 FINDINGS: Cardiomegaly. Mild diffuse coarse chronic interstitial opacity. No acute focal airspace disease or effusion. Aortic atherosclerosis. No pneumothorax. IMPRESSION: Cardiomegaly. No edema or infiltrate. Mild diffuse coarse chronic interstitial opacity Electronically Signed   By: Jasmine Pang M.D.   On: 07/04/2019 18:19   CT VENOGRAM HEAD  Result Date: 07/04/2019 CLINICAL DATA:  Syncope and weakness EXAM: CT VENOGRAM HEAD TECHNIQUE: Vena graphic images of the head were obtained following the administration of intravenous contrast material. Multiplanar reformats and maximum intensity projections were constructed. CONTRAST:  75mL  OMNIPAQUE IOHEXOL 350 MG/ML SOLN COMPARISON:  CTA head neck same day FINDINGS: Superior sagittal sinus: Normal. Straight sinus: Normal. Inferior sagittal sinus, vein of Galen and internal cerebral veins: Normal. Transverse sinuses: Normal. Sigmoid sinuses: Normal. Visualized jugular veins: Normal. IMPRESSION: Normal head CT venogram. Electronically Signed   By: Deatra Robinson M.D.   On: 07/04/2019 21:27    ____________________________________________  PROCEDURES   Procedure(s) performed (including Critical Care):  .Critical Care Performed by: Shaune Pollack, MD Authorized by: Shaune Pollack, MD   Critical care provider statement:    Critical care time (minutes):  75   Critical care time was exclusive of:  Separately billable procedures and treating other patients and teaching time   Critical care was necessary to treat or prevent imminent or life-threatening deterioration of the following conditions:  Cardiac failure, circulatory failure, respiratory failure, sepsis and CNS failure or compromise   Critical care was time spent personally by me on the following activities:  Development of treatment plan with patient or surrogate, discussions with consultants, evaluation of patient's response to treatment, examination of patient, obtaining history from patient or surrogate, ordering and performing treatments and interventions, ordering and review of laboratory studies, ordering and review of radiographic studies, pulse oximetry, re-evaluation of patient's condition and review of old charts   I assumed direction of critical care for this patient from another provider in my specialty: no   Ultrasound ED Peripheral IV (Provider)  Date/Time: 07/05/2019 12:31 AM Performed by: Shaune Pollack, MD Authorized by: Shaune Pollack, MD   Procedure details:    Indications: multiple failed IV attempts     Skin Prep: chlorhexidine gluconate     Location:  Left anterior forearm   Angiocath:  20 G    Bedside Ultrasound Guided: Yes     Images: archived     Patient tolerated procedure without complications: Yes     Dressing applied: Yes   .1-3 Lead EKG Interpretation Performed by: Shaune Pollack, MD Authorized by: Shaune Pollack, MD     Interpretation: non-specific     ECG rate:  90-150   ECG rate assessment: tachycardic     Rhythm: sinus rhythm     Ectopy: PVCs     Conduction: normal   Comments:     Indication: Possible seizures, AMS    ____________________________________________  INITIAL IMPRESSION / MDM / ASSESSMENT AND PLAN / ED COURSE  As part of my medical decision making, I reviewed the following data within the electronic MEDICAL RECORD NUMBER Nursing notes reviewed and incorporated, Old chart reviewed, Notes from prior ED visits, and [place] Controlled Substance Database       *John Haas was evaluated  in Emergency Department on 07/05/2019 for the symptoms described in the history of present illness. He was evaluated in the context of the global COVID-19 pandemic, which necessitated consideration that the patient might be at risk for infection with the SARS-CoV-2 virus that causes COVID-19. Institutional protocols and algorithms that pertain to the evaluation of patients at risk for COVID-19 are in a state of rapid change based on information released by regulatory bodies including the CDC and federal and state organizations. These policies and algorithms were followed during the patient's care in the ED.  Some ED evaluations and interventions may be delayed as a result of limited staffing during the pandemic.*  Clinical Course as of Jul 04 29  Tue Jul 04, 2019  1810 54 yo M here with multiple complaints, all of which were previously noted on ED visit yesterday. Reviewed those records, which were overall reassuring. He c/o headache again today, but repeat CT remains unremarkable on my review. He has no focal neurological deficits. His left-ward gaze that was reported seems  volitional as he blinks to threat and was awake, talking during his bilateral shaking episode which does not fit with seizure. He does have a h/o recurrent hyperglycemia, sickle cell so will check labs, treat with non-narcotics for his headache, and reassess.   [CI]  1824 CT head reviewed, negative. CXR with no new focal opacity.   [CI]  1857 Patient verbally abusive to staff intermittently, yelling out. He is awake, alert on my assessment.    [CI]  1906 Patient c/o chest pain, headache, pain all over and became tachycardic to 150s. Labs pending.   [CI]  2032 On my reassessment, patient noted to intermittently become tachycardic during which she desats to the upper 80s, and on my exam has leftward beating nystagmus.  Interestingly, he has difficulty moving his right side, but then is able to answer questions.  He was then mildly confused after this.  Clinically, high concern for new onset partial complex seizures.  Given his headache, and sickle cell disease, differential includes stroke, sinus thrombosis, moyamoya, also must consider infectious etiologies such as a viral encephalitis, less likely meningitis.  His white blood cell count does seem to be increasing compared to recent ED visit.   [CI]  2223 CT Angio and CTV negative. Patient has had no additional episodes of tachycardia/desats after Ativan. He is drowsy but protecting airway. Discussed with Dr. Amada Jupiter at [facility], which unfortunately is full. Discussed with Dr. Mordecai Rasmussen at [facility], who is well acquainted with patient. She states his sickle cell is well controlled/mild and has reviewed labs, imaging. She agrees w/ admission as pt has no history of this, but unfortunately [facility] is full.   [CI]  2356 I have now spoken with [facility] as well, who has accepted to [facility] but pt is on wait list. [facility], [facility] also full on re-check. Keppra given, will continue ativan PRN, gentle fluids, monitoring. Empiric ABX have been started and given. Unable to LP  2/2 agitation and pt intolerance.   [CI]  Wed Jul 05, 2019  0017 Will plan to keep on wait list at [facility], accepted by Dr. Carlyle Lipa. Patient has been stable in ED since receiving antiepileptics and blood gas reassuring. Will plan on discussing with hospitalist re: possible admission here while waiting.   [CI]    Clinical Course User Index [CI] Shaune Pollack, MD    Medical Decision Making:  As above. 54 yo M with sickle cell disease here with headache, new-onset  seizures.     ____________________________________________  FINAL CLINICAL IMPRESSION(S) / ED DIAGNOSES  Final diagnoses:  Chronic pain syndrome  New onset seizure (HCC)  Severe headache  Other elevated white blood cell (WBC) count     MEDICATIONS GIVEN DURING THIS VISIT:  Medications  haloperidol lactate (HALDOL) injection 2.5 mg (2.5 mg Intravenous Given 07/04/19 1851)  diphenhydrAMINE (BENADRYL) injection 25 mg (25 mg Intravenous Given 07/04/19 1850)  buprenorphine-naloxone (SUBOXONE) 8-2 mg per SL tablet 1 tablet (1 tablet Sublingual Given 07/04/19 1830)  0.9 %  sodium chloride infusion ( Intravenous Stopped 07/05/19 0007)  acetaminophen (TYLENOL) tablet 1,000 mg (1,000 mg Oral Given 07/04/19 1936)  LORazepam (ATIVAN) injection 2 mg (2 mg Intravenous Given 07/04/19 2048)  dexamethasone (DECADRON) injection 10 mg (10 mg Intravenous Given 07/04/19 2050)  cefTRIAXone (ROCEPHIN) 2 g in sodium chloride 0.9 % 100 mL IVPB (2 g Intravenous New Bag/Given 07/05/19 0001)  vancomycin (VANCOREADY) IVPB 2000 mg/400 mL (2,000 mg Intravenous New Bag/Given 07/04/19 2155)  levETIRAcetam (KEPPRA) 4,000 mg in sodium chloride 0.9 % 250 mL IVPB (0 mg Intravenous Stopped 07/05/19 0000)  iohexol (OMNIPAQUE) 350 MG/ML injection 75 mL (75 mLs Intravenous Contrast Given 07/04/19 2112)     ED Discharge Orders    None       Note:  This document was prepared using Dragon voice recognition software and may include unintentional dictation errors.     Shaune Pollack, MD 07/05/19 [phone]

## 2019-07-04 NOTE — Progress Notes (Signed)
PHARMACY -  BRIEF ANTIBIOTIC NOTE   Pharmacy has received consult(s) for vancomycin from an ED provider.  The patient's profile has been reviewed for ht/wt/allergies/indication/available labs.    One time order(s) placed for vanc 2 g IV  Further antibiotics/pharmacy consults should be ordered by admitting physician if indicated.                       Thank you,  Pricilla Riffle, PharmD 07/04/2019  8:20 PM

## 2019-07-04 NOTE — ED Notes (Signed)
Pt was rude and biligerent towards nursing staff. Pt discharged, but refused to let RN go though discharge instructions. Pt taken out to lobby in wheelchair. Pt was able to pick up personal belongings with no assistance and was able to identify where in the room they were to pick them up himself.

## 2019-07-04 NOTE — ED Notes (Signed)
PT refuses to let this RN iv stick- demands that IV team comes to use ultrasound. Order received.

## 2019-07-05 DIAGNOSIS — G9340 Encephalopathy, unspecified: Secondary | ICD-10-CM | POA: Diagnosis not present

## 2019-07-05 DIAGNOSIS — D57459 Sickle-cell thalassemia beta plus with crisis, unspecified: Secondary | ICD-10-CM | POA: Diagnosis present

## 2019-07-05 DIAGNOSIS — I350 Nonrheumatic aortic (valve) stenosis: Secondary | ICD-10-CM | POA: Diagnosis not present

## 2019-07-05 DIAGNOSIS — Z884 Allergy status to anesthetic agent status: Secondary | ICD-10-CM

## 2019-07-05 DIAGNOSIS — F1120 Opioid dependence, uncomplicated: Secondary | ICD-10-CM

## 2019-07-05 DIAGNOSIS — D5740 Sickle-cell thalassemia without crisis: Secondary | ICD-10-CM | POA: Diagnosis not present

## 2019-07-05 DIAGNOSIS — F19239 Other psychoactive substance dependence with withdrawal, unspecified: Secondary | ICD-10-CM | POA: Diagnosis present

## 2019-07-05 DIAGNOSIS — I169 Hypertensive crisis, unspecified: Secondary | ICD-10-CM | POA: Diagnosis present

## 2019-07-05 DIAGNOSIS — F17200 Nicotine dependence, unspecified, uncomplicated: Secondary | ICD-10-CM

## 2019-07-05 DIAGNOSIS — Z8249 Family history of ischemic heart disease and other diseases of the circulatory system: Secondary | ICD-10-CM | POA: Diagnosis not present

## 2019-07-05 DIAGNOSIS — J209 Acute bronchitis, unspecified: Secondary | ICD-10-CM | POA: Diagnosis present

## 2019-07-05 DIAGNOSIS — G894 Chronic pain syndrome: Secondary | ICD-10-CM | POA: Diagnosis present

## 2019-07-05 DIAGNOSIS — B004 Herpesviral encephalitis: Secondary | ICD-10-CM | POA: Diagnosis present

## 2019-07-05 DIAGNOSIS — E119 Type 2 diabetes mellitus without complications: Secondary | ICD-10-CM

## 2019-07-05 DIAGNOSIS — J440 Chronic obstructive pulmonary disease with acute lower respiratory infection: Secondary | ICD-10-CM

## 2019-07-05 DIAGNOSIS — Z794 Long term (current) use of insulin: Secondary | ICD-10-CM

## 2019-07-05 DIAGNOSIS — H539 Unspecified visual disturbance: Secondary | ICD-10-CM

## 2019-07-05 DIAGNOSIS — G039 Meningitis, unspecified: Secondary | ICD-10-CM | POA: Diagnosis not present

## 2019-07-05 DIAGNOSIS — R569 Unspecified convulsions: Secondary | ICD-10-CM | POA: Diagnosis present

## 2019-07-05 DIAGNOSIS — G9341 Metabolic encephalopathy: Secondary | ICD-10-CM | POA: Diagnosis present

## 2019-07-05 DIAGNOSIS — F17210 Nicotine dependence, cigarettes, uncomplicated: Secondary | ICD-10-CM | POA: Diagnosis present

## 2019-07-05 DIAGNOSIS — R519 Headache, unspecified: Secondary | ICD-10-CM | POA: Diagnosis not present

## 2019-07-05 DIAGNOSIS — Z20822 Contact with and (suspected) exposure to covid-19: Secondary | ICD-10-CM | POA: Diagnosis present

## 2019-07-05 DIAGNOSIS — Z9181 History of falling: Secondary | ICD-10-CM

## 2019-07-05 DIAGNOSIS — Z888 Allergy status to other drugs, medicaments and biological substances status: Secondary | ICD-10-CM | POA: Diagnosis not present

## 2019-07-05 DIAGNOSIS — R410 Disorientation, unspecified: Secondary | ICD-10-CM

## 2019-07-05 DIAGNOSIS — D649 Anemia, unspecified: Secondary | ICD-10-CM | POA: Diagnosis not present

## 2019-07-05 DIAGNOSIS — R451 Restlessness and agitation: Secondary | ICD-10-CM

## 2019-07-05 DIAGNOSIS — G40209 Localization-related (focal) (partial) symptomatic epilepsy and epileptic syndromes with complex partial seizures, not intractable, without status epilepticus: Secondary | ICD-10-CM | POA: Diagnosis not present

## 2019-07-05 DIAGNOSIS — E1165 Type 2 diabetes mellitus with hyperglycemia: Secondary | ICD-10-CM | POA: Diagnosis present

## 2019-07-05 DIAGNOSIS — Z9114 Patient's other noncompliance with medication regimen: Secondary | ICD-10-CM

## 2019-07-05 DIAGNOSIS — J9601 Acute respiratory failure with hypoxia: Secondary | ICD-10-CM | POA: Diagnosis not present

## 2019-07-05 DIAGNOSIS — G40901 Epilepsy, unspecified, not intractable, with status epilepticus: Secondary | ICD-10-CM | POA: Diagnosis present

## 2019-07-05 DIAGNOSIS — J9602 Acute respiratory failure with hypercapnia: Secondary | ICD-10-CM | POA: Diagnosis not present

## 2019-07-05 DIAGNOSIS — G9608 Other cranial cerebrospinal fluid leak: Secondary | ICD-10-CM | POA: Diagnosis present

## 2019-07-05 DIAGNOSIS — I129 Hypertensive chronic kidney disease with stage 1 through stage 4 chronic kidney disease, or unspecified chronic kidney disease: Secondary | ICD-10-CM | POA: Diagnosis present

## 2019-07-05 DIAGNOSIS — G0490 Encephalitis and encephalomyelitis, unspecified: Secondary | ICD-10-CM | POA: Diagnosis not present

## 2019-07-05 DIAGNOSIS — G43909 Migraine, unspecified, not intractable, without status migrainosus: Secondary | ICD-10-CM | POA: Diagnosis present

## 2019-07-05 DIAGNOSIS — E870 Hyperosmolality and hypernatremia: Secondary | ICD-10-CM | POA: Diagnosis not present

## 2019-07-05 DIAGNOSIS — G40909 Epilepsy, unspecified, not intractable, without status epilepticus: Secondary | ICD-10-CM | POA: Diagnosis not present

## 2019-07-05 DIAGNOSIS — I252 Old myocardial infarction: Secondary | ICD-10-CM | POA: Diagnosis not present

## 2019-07-05 DIAGNOSIS — E11621 Type 2 diabetes mellitus with foot ulcer: Secondary | ICD-10-CM | POA: Diagnosis present

## 2019-07-05 DIAGNOSIS — E871 Hypo-osmolality and hyponatremia: Secondary | ICD-10-CM | POA: Diagnosis present

## 2019-07-05 DIAGNOSIS — L97519 Non-pressure chronic ulcer of other part of right foot with unspecified severity: Secondary | ICD-10-CM | POA: Diagnosis not present

## 2019-07-05 LAB — BASIC METABOLIC PANEL
Anion gap: 10 (ref 5–15)
BUN: 19 mg/dL (ref 6–20)
CO2: 20 mmol/L — ABNORMAL LOW (ref 22–32)
Calcium: 8.6 mg/dL — ABNORMAL LOW (ref 8.9–10.3)
Chloride: 105 mmol/L (ref 98–111)
Creatinine, Ser: 1.18 mg/dL (ref 0.61–1.24)
GFR calc Af Amer: >60 mL/min (ref >60)
GFR calc non Af Amer: >60 mL/min (ref >60)
Glucose, Bld: 348 mg/dL — ABNORMAL HIGH (ref 70–99)
Potassium: 5.1 mmol/L (ref 3.5–5.1)
Sodium: 135 mmol/L (ref 135–145)

## 2019-07-05 LAB — CBC
HCT: 34.5 % — ABNORMAL LOW (ref 39.0–52.0)
Hemoglobin: 11.2 g/dL — ABNORMAL LOW (ref 13.0–17.0)
MCH: 21.6 pg — ABNORMAL LOW (ref 26.0–34.0)
MCHC: 32.5 g/dL (ref 30.0–36.0)
MCV: 66.5 fL — ABNORMAL LOW (ref 80.0–100.0)
Platelets: 307 10*3/uL (ref 150–400)
RBC: 5.19 MIL/uL (ref 4.22–5.81)
RDW: 16.2 % — ABNORMAL HIGH (ref 11.5–15.5)
WBC: 13.3 10*3/uL — ABNORMAL HIGH (ref 4.0–10.5)
nRBC: 2.2 % — ABNORMAL HIGH (ref 0.0–0.2)

## 2019-07-05 LAB — MRSA PCR SCREENING: MRSA by PCR: NEGATIVE

## 2019-07-05 LAB — GLUCOSE, CAPILLARY
Glucose-Capillary: 380 mg/dL — ABNORMAL HIGH (ref 70–99)
Glucose-Capillary: 396 mg/dL — ABNORMAL HIGH (ref 70–99)
Glucose-Capillary: 302 mg/dL — ABNORMAL HIGH (ref 70–99)
Glucose-Capillary: 258 mg/dL — ABNORMAL HIGH (ref 70–99)
Glucose-Capillary: 184 mg/dL — ABNORMAL HIGH (ref 70–99)
Glucose-Capillary: 223 mg/dL — ABNORMAL HIGH (ref 70–99)

## 2019-07-05 LAB — VITAMIN B12: Vitamin B-12: 308 pg/mL (ref 180–914)

## 2019-07-05 LAB — HIV ANTIBODY (ROUTINE TESTING W REFLEX): HIV Screen 4th Generation wRfx: NONREACTIVE

## 2019-07-05 LAB — FOLATE: Folate: 10.4 ng/mL (ref >5.9)

## 2019-07-05 LAB — TSH: TSH: 0.249 u[IU]/mL — ABNORMAL LOW (ref 0.350–4.500)

## 2019-07-05 MED ORDER — CHLORHEXIDINE GLUCONATE CLOTH 2 % EX PADS
6.0000 | MEDICATED_PAD | Freq: Every day | CUTANEOUS | Status: DC
  Administered 2019-07-05 – 2019-07-13 (×6): 6 via TOPICAL
  Filled 2019-07-05: qty 6

## 2019-07-05 MED ORDER — LORAZEPAM 2 MG/ML IJ SOLN
1.0000 mg | INTRAMUSCULAR | Status: DC | PRN
  Administered 2019-07-05 (×2): 2 mg via INTRAVENOUS
  Filled 2019-07-05 (×2): qty 1

## 2019-07-05 MED ORDER — TAMSULOSIN HCL 0.4 MG PO CAPS
0.4000 mg | ORAL_CAPSULE | Freq: Every day | ORAL | Status: DC
  Administered 2019-07-05 – 2019-07-09 (×3): 0.4 mg via ORAL
  Filled 2019-07-05 (×4): qty 1

## 2019-07-05 MED ORDER — BUPRENORPHINE HCL-NALOXONE HCL 8-2 MG SL SUBL
3.0000 | SUBLINGUAL_TABLET | Freq: Every day | SUBLINGUAL | Status: DC
  Administered 2019-07-05: 3 via SUBLINGUAL
  Filled 2019-07-05 (×2): qty 3

## 2019-07-05 MED ORDER — INSULIN ASPART 100 UNIT/ML ~~LOC~~ SOLN
0.0000 [IU] | Freq: Three times a day (TID) | SUBCUTANEOUS | Status: DC
  Administered 2019-07-05: 8 [IU] via SUBCUTANEOUS
  Administered 2019-07-05: 11 [IU] via SUBCUTANEOUS
  Administered 2019-07-05: 3 [IU] via SUBCUTANEOUS
  Administered 2019-07-06: 12:00:00 5 [IU] via SUBCUTANEOUS
  Administered 2019-07-06: 8 [IU] via SUBCUTANEOUS
  Administered 2019-07-06: 5 [IU] via SUBCUTANEOUS
  Administered 2019-07-07: 08:00:00 8 [IU] via SUBCUTANEOUS
  Filled 2019-07-05 (×7): qty 1

## 2019-07-05 MED ORDER — PANTOPRAZOLE SODIUM 40 MG PO TBEC
40.0000 mg | DELAYED_RELEASE_TABLET | Freq: Every day | ORAL | Status: DC
  Administered 2019-07-05: 40 mg via ORAL
  Filled 2019-07-05 (×2): qty 1

## 2019-07-05 MED ORDER — ACETAMINOPHEN 325 MG PO TABS
650.0000 mg | ORAL_TABLET | Freq: Four times a day (QID) | ORAL | Status: DC | PRN
  Administered 2019-07-05 – 2019-07-12 (×2): 650 mg via ORAL
  Filled 2019-07-05 (×2): qty 2

## 2019-07-05 MED ORDER — LORAZEPAM 2 MG/ML IJ SOLN
1.0000 mg | INTRAMUSCULAR | Status: DC | PRN
  Administered 2019-07-05 – 2019-07-07 (×11): 2 mg via INTRAVENOUS
  Filled 2019-07-05 (×12): qty 1

## 2019-07-05 MED ORDER — ORAL CARE MOUTH RINSE
15.0000 mL | Freq: Two times a day (BID) | OROMUCOSAL | Status: DC
  Administered 2019-07-05 – 2019-07-06 (×4): 15 mL via OROMUCOSAL

## 2019-07-05 MED ORDER — DEXTROSE 5 % IV SOLN
1100.0000 mg | Freq: Three times a day (TID) | INTRAVENOUS | Status: DC
  Administered 2019-07-05: 1100 mg via INTRAVENOUS
  Filled 2019-07-05 (×2): qty 22

## 2019-07-05 MED ORDER — SODIUM CHLORIDE 0.9 % IV SOLN: INTRAVENOUS | Status: DC

## 2019-07-05 MED ORDER — ONDANSETRON HCL 4 MG/2ML IJ SOLN
4.0000 mg | Freq: Four times a day (QID) | INTRAMUSCULAR | Status: DC | PRN
  Administered 2019-07-06: 4 mg via INTRAVENOUS
  Filled 2019-07-05: qty 2

## 2019-07-05 MED ORDER — SODIUM CHLORIDE 0.9 % IV SOLN
2.0000 g | INTRAVENOUS | Status: DC
  Administered 2019-07-05 – 2019-07-09 (×25): 2 g via INTRAVENOUS
  Filled 2019-07-05 (×2): qty 2
  Filled 2019-07-05 (×3): qty 2000
  Filled 2019-07-05 (×4): qty 2
  Filled 2019-07-05: qty 2000
  Filled 2019-07-05 (×5): qty 2
  Filled 2019-07-05: qty 2000
  Filled 2019-07-05: qty 2
  Filled 2019-07-05: qty 2000
  Filled 2019-07-05 (×4): qty 2
  Filled 2019-07-05 (×2): qty 2000
  Filled 2019-07-05 (×5): qty 2
  Filled 2019-07-05: qty 2000
  Filled 2019-07-05: qty 2
  Filled 2019-07-05 (×4): qty 2000

## 2019-07-05 MED ORDER — LEVETIRACETAM IN NACL 500 MG/100ML IV SOLN
500.0000 mg | Freq: Two times a day (BID) | INTRAVENOUS | Status: DC
  Administered 2019-07-05 – 2019-07-06 (×3): 500 mg via INTRAVENOUS
  Filled 2019-07-05 (×5): qty 100

## 2019-07-05 MED ORDER — DEXTROSE 5 % IV SOLN
920.0000 mg | Freq: Three times a day (TID) | INTRAVENOUS | Status: DC
  Administered 2019-07-05 – 2019-07-08 (×11): 920 mg via INTRAVENOUS
  Filled 2019-07-05 (×13): qty 18.4

## 2019-07-05 MED ORDER — VANCOMYCIN HCL IN DEXTROSE 1-5 GM/200ML-% IV SOLN
1000.0000 mg | Freq: Three times a day (TID) | INTRAVENOUS | Status: DC
  Administered 2019-07-05: 11:00:00 1000 mg via INTRAVENOUS
  Filled 2019-07-05 (×3): qty 200

## 2019-07-05 MED ORDER — NICOTINE 21 MG/24HR TD PT24
21.0000 mg | MEDICATED_PATCH | Freq: Every day | TRANSDERMAL | Status: DC
  Administered 2019-07-05 – 2019-07-06 (×2): 21 mg via TRANSDERMAL
  Filled 2019-07-05 (×2): qty 1

## 2019-07-05 MED ORDER — LIDOCAINE 5 % EX PTCH
1.0000 | MEDICATED_PATCH | CUTANEOUS | Status: DC
  Filled 2019-07-05 (×2): qty 1

## 2019-07-05 MED ORDER — BUPRENORPHINE HCL-NALOXONE HCL 8-2 MG SL SUBL: 2.5000 | SUBLINGUAL_TABLET | Freq: Every day | SUBLINGUAL | Status: DC

## 2019-07-05 MED ORDER — ONDANSETRON HCL 4 MG PO TABS: 4.0000 mg | ORAL_TABLET | Freq: Four times a day (QID) | ORAL | Status: DC | PRN

## 2019-07-05 MED ORDER — INSULIN ASPART 100 UNIT/ML ~~LOC~~ SOLN
0.0000 [IU] | Freq: Every day | SUBCUTANEOUS | Status: DC
  Administered 2019-07-05: 22:00:00 2 [IU] via SUBCUTANEOUS
  Administered 2019-07-05: 03:00:00 5 [IU] via SUBCUTANEOUS
  Administered 2019-07-06: 21:00:00 2 [IU] via SUBCUTANEOUS
  Filled 2019-07-05 (×3): qty 1

## 2019-07-05 MED ORDER — VANCOMYCIN HCL 1500 MG/300ML IV SOLN: 1500.0000 mg | Freq: Two times a day (BID) | INTRAVENOUS | Status: DC

## 2019-07-05 MED ORDER — SODIUM CHLORIDE 0.9 % IV SOLN: 75.0000 mL/h | INTRAVENOUS | Status: DC

## 2019-07-05 MED ORDER — DEXAMETHASONE SODIUM PHOSPHATE 10 MG/ML IJ SOLN
10.0000 mg | Freq: Four times a day (QID) | INTRAMUSCULAR | Status: DC
  Administered 2019-07-05 – 2019-07-08 (×10): 10 mg via INTRAVENOUS
  Filled 2019-07-05 (×17): qty 1

## 2019-07-05 MED ORDER — SODIUM CHLORIDE 0.9 % IV SOLN
2.0000 g | Freq: Two times a day (BID) | INTRAVENOUS | Status: DC
  Administered 2019-07-05 – 2019-07-06 (×3): 2 g via INTRAVENOUS
  Filled 2019-07-05: qty 20
  Filled 2019-07-05: qty 2
  Filled 2019-07-05 (×2): qty 20
  Filled 2019-07-05: qty 2
  Filled 2019-07-05: qty 20

## 2019-07-05 MED ORDER — ACETAMINOPHEN 650 MG RE SUPP: 650.0000 mg | Freq: Four times a day (QID) | RECTAL | Status: DC | PRN

## 2019-07-05 MED ORDER — VANCOMYCIN HCL 750 MG/150ML IV SOLN
750.0000 mg | Freq: Two times a day (BID) | INTRAVENOUS | Status: DC
  Filled 2019-07-05 (×2): qty 150

## 2019-07-05 NOTE — Consult Note (Addendum)
Reason for Consult:Seizure Requesting Physician: Luberta Robertsonhatterjee  CC: Seizure  I have been asked by Dr. Luberta Robertsonhatterjee to see this patient in consultation for Seizure.  HPI: John Haas is an 54 y.o. male with medical history significant for sickle thalassemia, COPD, diabetes and hypertension who presented to the emergency room with a complaint of headache and aching all over.  [facility] but left without being seen.  In the ER here he became belligerent at times and at times appeared confused.  Also while in the emergency room he was noted to have intermittent shaking of his left arm and leg with deviation of his head to the left but he remained awake and alert during these episodes.  Concern was for partial seizures and patient was started on Keppra.  No further shaking episodes noted but patient continues to complain of pain all over, right sided headache and blurred vision due to problem with his right eye.  Patient is difficult historian and uncooperative.    Past Medical History:  Diagnosis Date  . COPD (chronic obstructive pulmonary disease) (HCC)   . Diabetes mellitus without complication (HCC)   . Heart attack (HCC)   . Hypertension   . 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 Sickle cell anemia (HCC)     Past Surgical History:  Procedure Laterality Date  . ANKLE SURGERY    . KNEE SURGERY    . PORT-A-CATH REMOVAL      Family history: Father with HTN and leukemia.  Mother with lung cancer.  Both parents deceased.    Social History:  reports that he has been smoking. He has never used smokeless tobacco. He reports that he does not drink alcohol or use drugs.  Allergies  Allergen Reactions  . Ketamine Hives  . Nubain [Nalbuphine Hcl] Hives  . Stadol [Butorphanol] Hives    Medications:  I have reviewed the patient's current medications. Prior to Admission:  Medications Prior to Admission  Medication Sig  Dispense Refill Last Dose  . acetaminophen (TYLENOL) 500 MG tablet Take by mouth.   Past Week at PRN  . albuterol (VENTOLIN HFA) 108 (90 Base) MCG/ACT inhaler Inhale into the lungs.   Past Week at PRN  . aspirin 81 MG EC tablet Take by mouth.   Past Week at Unknown time  . buprenorphine-naloxone (SUBOXONE) 8-2 mg SUBL SL tablet SMARTSIG:2.5 Tablet(s) Sublingual Daily   Past Week at Unknown time  . Cholecalciferol 25 MCG (1000 UT) capsule Take by mouth.   Past Week at Unknown time  . ciprofloxacin (CIPRO) 500 MG tablet Take 500 mg by mouth 2 (two) times daily.   Past Week at Unknown time  . fluticasone (FLONASE) 50 MCG/ACT nasal spray 1 spray by Each Nare route daily.   Past Week at Unknown time  . folic acid (FOLVITE) 1 MG tablet Take by mouth.   Past Week at Unknown time  . insulin aspart (NOVOLOG) 100 UNIT/ML injection Inject into the skin.   Past Week at Unknown time  . lidocaine (LIDODERM) 5 % Place onto the skin.   Past Week at Unknown time  . naloxone (NARCAN) nasal spray 4 mg/0.1 mL One spray in either nostril once for known/suspected opioid overdose. May repeat every 2-3 minutes in alternating nostril til EMS arrives   Past Week at PRN  . nicotine (NICODERM CQ - DOSED IN MG/24 HOURS) 21 mg/24hr patch Place onto the skin.  Past Week at Unknown time  . omeprazole (PRILOSEC) 20 MG capsule Take 20 mg by mouth 2 (two) times daily.   Past Week at Unknown time  . ondansetron (ZOFRAN-ODT) 4 MG disintegrating tablet Take 4 mg by mouth every 8 (eight) hours as needed.   Past Week at PRN  . pravastatin (PRAVACHOL) 20 MG tablet Take by mouth.   Past Week at Unknown time  . SEMAGLUTIDE,0.25 OR 0.5MG /DOS, [place] Inject into the skin.   Past Week at Unknown time  . tamsulosin (FLOMAX) 0.4 MG CAPS capsule Take 0.4 mg by mouth daily.   Past Week at Unknown time  . amoxicillin (AMOXIL) 500 MG capsule Take 1 capsule (500 mg total) by mouth 3 (three) times daily. 30 capsule 0   . lidocaine (XYLOCAINE) 2 %  solution Use as directed 10 mLs in the mouth or throat as needed. (Patient not taking: Reported on 07/05/2019) 100 mL 0 Not Taking   Scheduled: . Chlorhexidine Gluconate Cloth  6 each Topical Daily  . dexamethasone (DECADRON) injection  10 mg Intravenous Q6H  . insulin aspart  0-15 Units Subcutaneous TID WC  . insulin aspart  0-5 Units Subcutaneous QHS  . mouth rinse  15 mL Mouth Rinse BID    ROS: History obtained from the patient  General ROS: negative for - chills, fatigue, fever, night sweats, weight gain or weight loss Psychological ROS: negative for - behavioral disorder, hallucinations, memory difficulties, mood swings or suicidal ideation Ophthalmic ROS: negative for - blurry vision, double vision, eye pain or loss of vision ENT ROS: negative for - epistaxis, nasal discharge, oral lesions, sore throat, tinnitus or vertigo Allergy and Immunology ROS: negative for - hives or itchy/watery eyes Hematological and Lymphatic ROS: negative for - bleeding problems, bruising or swollen lymph nodes Endocrine ROS: negative for - galactorrhea, hair pattern changes, polydipsia/polyuria or temperature intolerance Respiratory ROS: negative for - cough, hemoptysis, shortness of breath or wheezing Cardiovascular ROS: negative for - chest pain, dyspnea on exertion, edema or irregular heartbeat Gastrointestinal ROS: negative for - abdominal pain, diarrhea, hematemesis, nausea/vomiting or stool incontinence Genito-Urinary ROS: negative for - dysuria, hematuria, incontinence or urinary frequency/urgency Musculoskeletal ROS: negative for - joint swelling or muscular weakness Neurological ROS: as noted in HPI Dermatological ROS: negative for rash and skin lesion changes  Physical Examination: Blood pressure (!) 154/96, pulse 84, temperature 97.8 F (36.6 C), temperature source Axillary, resp. rate 15, height  (1.905 m), weight 102.7 kg, SpO2 96 %.  HEENT-  Normocephalic, no lesions, without  obvious abnormality.  Normal external eye and conjunctiva.  Normal TM's bilaterally.  Normal auditory canals and external ears. Normal external nose, mucus membranes and septum.  Normal pharynx. Cardiovascular- S1, S2 normal, pulses palpable throughout   Lungs- chest clear, no wheezing, rales, normal symmetric air entry Abdomen- soft, non-tender; bowel sounds normal; no masses,  no organomegaly Extremities- no edema Lymph-no adenopathy palpable Musculoskeletal-no joint tenderness, deformity or swelling   Neurological Examination   Mental Status: Lethargic.  Often uncooperative.  Oriented to name and place.  Speech fluent without evidence of aphasia.  Able to follow simple commands but often refuses. Cranial Nerves: II: Pupils equal, round, reactive to light and accommodation.  Will not allow pupils to be evaluated III,IV, VI: On initially opening eyes, the right eye is deviated upward and outward.  At times eyes are roaming but often disconjugate.   V,VII: Intact corneal bilaterally VIII: hearing normal bilaterally IX,X: gag reflex present XI: bilateral shoulder shrug  XII: midline tongue extension Motor: Moves all extremities to command but appears to move the LUE less than the other extremities.   Sensory: Pinprick and light touch intact throughout, bilaterally Deep Tendon Reflexes: Symmetric throughout Plantars: Right: mute   Left: mute Cerebellar: Unable to perform due to lack of cooperation Gait: not tested due to safety concerns    Laboratory Studies:   Basic Metabolic Panel: Recent Labs  Lab 07/03/19 2216 07/04/19 1832 07/05/19 0503  NA 128* 129* 135  K 4.0 4.0 5.1  CL 93* 96* 105  CO2 23 22 20*  GLUCOSE 443* 348* 348*  BUN 28* 22* 19  CREATININE 1.71* 1.48* 1.18  CALCIUM 8.9 8.9 8.6*    Liver Function Tests: Recent Labs  Lab 07/04/19 1832  AST 27  ALT 17  ALKPHOS 116  BILITOT 1.6*  PROT 8.1  ALBUMIN 3.9   No results for input(s): LIPASE, AMYLASE in  the last 168 hours. No results for input(s): AMMONIA in the last 168 hours.  CBC: Recent Labs  Lab 07/03/19 2216 07/04/19 1832 07/05/19 0743  WBC 19.7* 22.5* 13.3*  NEUTROABS 13.6* 17.0*  --   HGB 11.1* 11.6* 11.2*  HCT 33.8* 35.0* 34.5*  MCV 65.9* 65.8* 66.5*  PLT 352 352 307    Cardiac Enzymes: No results for input(s): CKTOTAL, CKMB, CKMBINDEX, TROPONINI in the last 168 hours.  BNP: Invalid input(s): POCBNP  CBG: Recent Labs  Lab 07/03/19 2334 07/04/19 0029 07/05/19 0231 07/05/19 0257 07/05/19 0741  GLUCAP 402* 318* 380* 396* 302*    Microbiology: Results for orders placed or performed during the hospital encounter of 07/04/19  Blood culture (routine x 2)     Status: None (Preliminary result)   Collection Time: 07/04/19  8:24 PM   Specimen: BLOOD  Result Value Ref Range Status   Specimen Description BLOOD RIGHT ANTECUBITAL  Final   Special Requests   Final    BOTTLES DRAWN AEROBIC AND ANAEROBIC Blood Culture adequate volume   Culture   Final    NO GROWTH < 12 HOURS Performed at [facility], [address] [place], [place] [address]    Report Status PENDING  Incomplete  Blood culture (routine x 2)     Status: None (Preliminary result)   Collection Time: 07/04/19  8:27 PM   Specimen: BLOOD  Result Value Ref Range Status   Specimen Description BLOOD LEFT ANTECUBITAL  Final   Special Requests   Final    BOTTLES DRAWN AEROBIC AND ANAEROBIC Blood Culture adequate volume   Culture   Final    NO GROWTH < 12 HOURS Performed at [facility], [address], [place], [place] [address]    Report Status PENDING  Incomplete  Respiratory Panel by RT PCR (Flu A&B, Covid) - Nasopharyngeal Swab     Status: None   Collection Time: 07/04/19  9:34 PM   Specimen: Nasopharyngeal Swab  Result Value Ref Range Status   SARS Coronavirus 2 by RT PCR NEGATIVE NEGATIVE Final    Comment: (NOTE) SARS-CoV-2 target nucleic acids are NOT DETECTED. The  SARS-CoV-2 RNA is generally detectable in upper respiratoy specimens during the acute phase of infection. The lowest concentration of SARS-CoV-2 viral copies this assay can detect is 131 copies/mL. A negative result does not preclude SARS-Cov-2 infection and should not be used as the sole basis for treatment or other patient management decisions. A negative result may occur with  improper specimen collection/handling, submission of specimen other than nasopharyngeal swab, presence  of viral mutation(s) within the areas targeted by this assay, and inadequate number of viral copies (<131 copies/mL). A negative result must be combined with clinical observations, patient history, and epidemiological information. The expected result is Negative. Fact Sheet for Patients:  https://www.moore.com/ Fact Sheet for Healthcare Providers:  https://www.young.biz/ This test is not yet ap proved or cleared by the [place] FDA and  has been authorized for detection and/or diagnosis of SARS-CoV-2 by FDA under an Emergency Use Authorization (EUA). This EUA will remain  in effect (meaning this test can be used) for the duration of the COVID-19 declaration under Section 564(b)(1) of the Act, 21 U.S.C. section 360bbb-3(b)(1), unless the authorization is terminated or revoked sooner.    Influenza A by PCR NEGATIVE NEGATIVE Final   Influenza B by PCR NEGATIVE NEGATIVE Final    Comment: (NOTE) The Xpert Xpress SARS-CoV-2/FLU/RSV assay is intended as an aid in  the diagnosis of influenza from Nasopharyngeal swab specimens and  should not be used as a sole basis for treatment. Nasal washings and  aspirates are unacceptable for Xpert Xpress SARS-CoV-2/FLU/RSV  testing. Fact Sheet for Patients: https://www.moore.com/ Fact Sheet for Healthcare Providers: https://www.young.biz/ This test is not yet approved or cleared by the [place] FDA and  has been authorized for detection and/or diagnosis of SARS-CoV-2 by  FDA under an Emergency Use Authorization (EUA). This EUA will remain  in effect (meaning this test can be used) for the duration of the  Covid-19 declaration under Section 564(b)(1) of the Act, 21  U.S.C. section 360bbb-3(b)(1), unless the authorization is  terminated or revoked. Performed at [facility], [address] [place], [place] [address]   MRSA PCR Screening     Status: None   Collection Time: 07/05/19  2:54 AM   Specimen: Nasal Mucosa; Nasopharyngeal  Result Value Ref Range Status   MRSA by PCR NEGATIVE NEGATIVE Final    Comment:        The GeneXpert MRSA Assay (FDA approved for NASAL specimens only), is one component of a comprehensive MRSA colonization surveillance program. It is not intended to diagnose MRSA infection nor to guide or monitor treatment for MRSA infections. Performed at [facility], [address] [place], [place] [address]     Coagulation Studies: No results for input(s): LABPROT, INR in the last 72 hours.  Urinalysis:  Recent Labs  Lab 07/03/19 1756 07/04/19 1944  COLORURINE STRAW* YELLOW*  LABSPEC 1.017 1.014  PHURINE 6.0 5.0  GLUCOSEU >=500* >=500*  HGBUR SMALL* SMALL*  BILIRUBINUR NEGATIVE NEGATIVE  KETONESUR NEGATIVE NEGATIVE  PROTEINUR NEGATIVE NEGATIVE  NITRITE NEGATIVE NEGATIVE  LEUKOCYTESUR NEGATIVE NEGATIVE    Lipid Panel:  No results found for: CHOL, TRIG, HDL, CHOLHDL, VLDL, LDLCALC  HgbA1C: No results found for: HGBA1C  Urine Drug Screen:      Component Value Date/Time   LABOPIA POSITIVE (A) 07/04/2019 1944   COCAINSCRNUR NONE DETECTED 07/04/2019 1944   LABBENZ NONE DETECTED 07/04/2019 1944   AMPHETMU NONE DETECTED 07/04/2019 1944   THCU NONE DETECTED 07/04/2019 1944   LABBARB NONE DETECTED 07/04/2019 1944    Alcohol Level: No results for input(s): ETH in the last 168 hours.  Other results: EKG: sinus  rhythm at 96 bpm.  Imaging: CT Angio Head W or Wo Contrast  Result Date: 07/04/2019 CLINICAL DATA:  Weakness, syncope. EXAM: CT ANGIOGRAPHY HEAD AND NECK TECHNIQUE: Multidetector CT imaging of the head and neck was performed using the standard protocol during bolus administration of intravenous  contrast. Multiplanar CT image reconstructions and MIPs were obtained to evaluate the vascular anatomy. Carotid stenosis measurements (when applicable) are obtained utilizing NASCET criteria, using the distal internal carotid diameter as the denominator. CONTRAST:  61mL OMNIPAQUE IOHEXOL 350 MG/ML SOLN COMPARISON:  None. FINDINGS: CT HEAD FINDINGS Brain: There is no mass, hemorrhage or extra-axial collection. The size and configuration of the ventricles and extra-axial CSF spaces are normal. 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 The brain parenchyma is normal. Skull: The visualized skull base, calvarium and extracranial soft tissues are normal. Sinuses/Orbits: No fluid levels or advanced mucosal thickening of the visualized paranasal sinuses. No mastoid or middle ear effusion. The orbits are normal. CTA NECK FINDINGS SKELETON: There is no bony spinal canal stenosis. No lytic or blastic lesion. OTHER NECK: Normal pharynx, larynx and major salivary glands. No cervical lymphadenopathy. Unremarkable thyroid gland. UPPER CHEST: No pneumothorax or pleural effusion. No nodules or masses. AORTIC ARCH: There is no calcific atherosclerosis of the aortic arch. There is no aneurysm, dissection or hemodynamically significant stenosis of the visualized portion of the aorta. Conventional 3 vessel aortic branching pattern. The visualized proximal subclavian arteries are widely patent. RIGHT CAROTID SYSTEM: Normal without aneurysm, dissection or stenosis. LEFT CAROTID SYSTEM: Normal without aneurysm, dissection or stenosis. VERTEBRAL ARTERIES: Codominant configuration. Both origins are clearly patent. There is no dissection,  occlusion or flow-limiting stenosis to the skull base (V1-V3 segments). CTA HEAD FINDINGS POSTERIOR CIRCULATION: --Vertebral arteries: Normal V4 segments. --Posterior inferior cerebellar arteries (PICA): Patent origins from the vertebral arteries. --Anterior inferior cerebellar arteries (AICA): Patent origins from the basilar artery. --Basilar artery: Normal. --Superior cerebellar arteries: Normal. --Posterior cerebral arteries: Normal. Both originate from the basilar artery. Posterior communicating arteries (p-comm) are diminutive or absent. ANTERIOR CIRCULATION: --Intracranial internal carotid arteries: Normal. --Anterior cerebral arteries (ACA): Normal. Both A1 segments are present. Patent anterior communicating artery (a-comm). --Middle cerebral arteries (MCA): Normal. VENOUS SINUSES: As permitted by contrast timing, patent. ANATOMIC VARIANTS: None Review of the MIP images confirms the above findings. IMPRESSION: 1. No emergent large vessel occlusion or high-grade stenosis. 2. 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 Electronically Signed   By: Ulyses Jarred M.D.   On: 07/04/2019 21:25   CT Head Wo Contrast  Result Date: 07/04/2019 CLINICAL DATA:  54 year old male with history of headache. Multiple falls. Seizures. EXAM: CT HEAD WITHOUT CONTRAST TECHNIQUE: Contiguous axial images were obtained from the base of the skull through the vertex without intravenous contrast. COMPARISON:  Head CT 07/03/2019. FINDINGS: Brain: Patchy and confluent areas of decreased attenuation are noted throughout the deep and periventricular white matter of the cerebral hemispheres bilaterally, compatible with chronic microvascular ischemic disease. No evidence of acute infarction, hemorrhage, hydrocephalus, extra-axial collection or mass lesion/mass effect. Vascular: No hyperdense vessel or unexpected calcification. Skull: Normal. Negative for fracture or focal lesion. Sinuses/Orbits: No acute finding. Other: None.  IMPRESSION: 1. No acute intracranial abnormalities. 2. Chronic microvascular ischemic changes in the cerebral white matter, as above. Electronically Signed   By: Vinnie Langton M.D.   On: 07/04/2019 18:13   CT Head Wo Contrast  Result Date: 07/03/2019 CLINICAL DATA:  Recent fall with headaches EXAM: CT HEAD WITHOUT CONTRAST TECHNIQUE: Contiguous axial images were obtained from the base of the skull through the vertex without intravenous contrast. COMPARISON:  None. FINDINGS: Brain: Mild atrophic changes are noted. Areas of prior ischemia are seen in the frontal lobes bilaterally. No findings to suggest acute hemorrhage, acute infarction or space-occupying mass lesion are seen. Vascular: No hyperdense  vessel or unexpected calcification. Skull: Normal. Negative for fracture or focal lesion. Sinuses/Orbits: No acute finding. Other: None. IMPRESSION: Mild atrophic changes and areas of prior ischemia without acute abnormality. Electronically Signed   By: Alcide Clever M.D.   On: 07/03/2019 23:13   CT Angio Neck W and/or Wo Contrast  Result Date: 07/04/2019 CLINICAL DATA:  Weakness, syncope. EXAM: CT ANGIOGRAPHY HEAD AND NECK TECHNIQUE: Multidetector CT imaging of the head and neck was performed using the standard protocol during bolus administration of intravenous contrast. Multiplanar CT image reconstructions and MIPs were obtained to evaluate the vascular anatomy. Carotid stenosis measurements (when applicable) are obtained utilizing NASCET criteria, using the distal internal carotid diameter as the denominator. CONTRAST:  75mL OMNIPAQUE IOHEXOL 350 MG/ML SOLN COMPARISON:  None. FINDINGS: CT HEAD FINDINGS Brain: There is no mass, hemorrhage or extra-axial collection. The size and configuration of the ventricles and extra-axial CSF spaces are normal. 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 The brain parenchyma is normal. Skull: The visualized skull base, calvarium and extracranial soft tissues are  normal. Sinuses/Orbits: No fluid levels or advanced mucosal thickening of the visualized paranasal sinuses. No mastoid or middle ear effusion. The orbits are normal. CTA NECK FINDINGS SKELETON: There is no bony spinal canal stenosis. No lytic or blastic lesion. OTHER NECK: Normal pharynx, larynx and major salivary glands. No cervical lymphadenopathy. Unremarkable thyroid gland. UPPER CHEST: No pneumothorax or pleural effusion. No nodules or masses. AORTIC ARCH: There is no calcific atherosclerosis of the aortic arch. There is no aneurysm, dissection or hemodynamically significant stenosis of the visualized portion of the aorta. Conventional 3 vessel aortic branching pattern. The visualized proximal subclavian arteries are widely patent. RIGHT CAROTID SYSTEM: Normal without aneurysm, dissection or stenosis. LEFT CAROTID SYSTEM: Normal without aneurysm, dissection or stenosis. VERTEBRAL ARTERIES: Codominant configuration. Both origins are clearly patent. There is no dissection, occlusion or flow-limiting stenosis to the skull base (V1-V3 segments). CTA HEAD FINDINGS POSTERIOR CIRCULATION: --Vertebral arteries: Normal V4 segments. --Posterior inferior cerebellar arteries (PICA): Patent origins from the vertebral arteries. --Anterior inferior cerebellar arteries (AICA): Patent origins from the basilar artery. --Basilar artery: Normal. --Superior cerebellar arteries: Normal. --Posterior cerebral arteries: Normal. Both originate from the basilar artery. Posterior communicating arteries (p-comm) are diminutive or absent. ANTERIOR CIRCULATION: --Intracranial internal carotid arteries: Normal. --Anterior cerebral arteries (ACA): Normal. Both A1 segments are present. Patent anterior communicating artery (a-comm). --Middle cerebral arteries (MCA): Normal. VENOUS SINUSES: As permitted by contrast timing, patent. ANATOMIC VARIANTS: None Review of the MIP images confirms the above findings. IMPRESSION: 1. No emergent large vessel  occlusion or high-grade stenosis. 2. 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 Electronically Signed   By: Deatra Robinson M.D.   On: 07/04/2019 21:25   DG Chest Portable 1 View  Result Date: 07/04/2019 CLINICAL DATA:  Chest pain EXAM: PORTABLE CHEST 1 VIEW COMPARISON:  10/04/2018, 12/13/2017 FINDINGS: Cardiomegaly. Mild diffuse coarse chronic interstitial opacity. No acute focal airspace disease or effusion. Aortic atherosclerosis. No pneumothorax. IMPRESSION: Cardiomegaly. No edema or infiltrate. Mild diffuse coarse chronic interstitial opacity Electronically Signed   By: Jasmine Pang M.D.   On: 07/04/2019 18:19   CT VENOGRAM HEAD  Result Date: 07/04/2019 CLINICAL DATA:  Syncope and weakness EXAM: CT VENOGRAM HEAD TECHNIQUE: Vena graphic images of the head were obtained following the administration of intravenous contrast material. Multiplanar reformats and maximum intensity projections were constructed. CONTRAST:  75mL OMNIPAQUE IOHEXOL 350 MG/ML SOLN COMPARISON:  CTA head neck same day FINDINGS: Superior sagittal sinus: Normal. Straight sinus:  Normal. Inferior sagittal sinus, vein of Galen and internal cerebral veins: Normal. Transverse sinuses: Normal. Sigmoid sinuses: Normal. Visualized jugular veins: Normal. IMPRESSION: Normal head CT venogram. Electronically Signed   By: Deatra Robinson M.D.   On: 07/04/2019 21:27     Assessment/Plan: 54 year old male with medical history significant for sickle thalassemia, COPD, diabetes and hypertension who presented to the emergency room with a complaint of headache and aching all over.  In the ED altered at times and with intermittent shaking of his left arm and leg with deviation of his head to the left while awake.  Felt to be partial seizures and started on Keppra.  Patient afebrile but with elevated white blood cell count.  Due to possibility of CNS infection patient started on broad spectrum antibiotics to include Acyclovir, Ampicillin and  Ceftriaxone.  Decadron also initiated.   Today patient lethargic and uncooperative but oriented.  Abnormal right extraocular movements noted and some possible LUE weakness.  Head CT personally reviewed and shows no acute changes.  CTA and CTV unremarkable as well.  Can not rule out focal lesion versus CNS infection particularly in the setting of focal exam and focal seizures at presentation.  LP indicated but patient adamantly refuses.    Recommendations: 1. ID directing antibiotic treatment 2. MRI of the brain with and without contrast 3. EEG 4. Continue Keppra at current dose of 500mg  BID 5. Seizure precautions 6. TSH, B12, folate.  RPR pending     , MD Neurology [phone] 07/05/2019, 11:25 AM

## 2019-07-05 NOTE — Consult Note (Signed)
NAME: John Haas  DOB: 1965/10/30  MRN: [id number]  Date/Time: 07/05/2019 9:44 AM  REQUESTING PROVIDER: Para March Subjective:  REASON FOR CONSULT: meningitis ?Patient is a limited historian.  Chart reviewed. John Haas is a 54 y.o. male with a history of sickle cell beta plus thalassemia, opioid dependence, on buprenorphine, diabetes mellitus Was brought in by EMS after he was found on the floor.  A neighbor heard him yelling and called EMS.  He apparently had been feeling very weak for the past 4 days and has been having frequent falls. Patient was complaining of headache on the right side for the past 5 days and he also had mentioned that his sugar was running high for many days and he was not taking his insulin properly. Patient also says he has trouble focusing his eyes straight on. In the ED his blood pressure was 145/98, heart rate of 91, respiratory of 20, temperature of 98.5 and pulse ox of 95%.  He had a WBC of 22.5, hemoglobin of 11.6, sodium of 129 and BUN of 22 and creatinine of 1.48.  Blood sugar was 443. Blood culture sent and he underwent CT of the headWhich revealed chronic microvascular changes in the frontal area He was noted to be looking at the left side frequently with intermittent shaking of his left arm and leg. On-call neurology was consulted and he was given Keppra and Ativan. Patient was belligerent but was oriented to most part. He was started on meningitis treatment with IV ampicillin, IV Rocephin, IV vancomycin and IV acyclovir along with dexamethasone. Am consulted for the same. Today when I went to see the patient he was Somnolent and after repeated calling he woke up but was unable to focus on me .  He was complaining of his head hurting and wanted buprenorphine. He was aggressive and was cursing.   Past Medical History:  Diagnosis Date  . COPD (chronic obstructive pulmonary disease) (HCC)   . Diabetes mellitus without complication (HCC)   . Heart  attack (HCC)   . Hypertension   . 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 Sickle cell anemia (HCC)     Past Surgical History:  Procedure Laterality Date  . ANKLE SURGERY    . KNEE SURGERY    . PORT-A-CATH REMOVAL      Social History   Socioeconomic History  . Marital status: Single    Spouse name: Not on file  . Number of children: Not on file  . Years of education: Not on file  . Highest education level: Not on file  Occupational History  . Not on file  Tobacco Use  . Smoking status: Current Every Day Smoker  . Smokeless tobacco: Never Used  Substance and Sexual Activity  . Alcohol use: No  . Drug use: No  . Sexual activity: Not on file  Other Topics Concern  . Not on file  Social History Narrative  . Not on file   Social Determinants of Health   Financial Resource Strain:   . Difficulty of Paying Living Expenses:   Food Insecurity:   . Worried About Programme researcher, broadcasting/film/video in the Last Year:   . Barista in the Last Year:   Transportation Needs:   . Freight forwarder (Medical):   Marland Kitchen Lack of Transportation (Non-Medical):   Physical Activity:   . Days of Exercise per Week:   . Minutes of Exercise per Session:  Stress:   . Feeling of Stress :   Social Connections:   . Frequency of Communication with Friends and Family:   . Frequency of Social Gatherings with Friends and Family:   . Attends Religious Services:   . Active Member of Clubs or Organizations:   . Attends Banker Meetings:   Marland Kitchen Marital Status:   Intimate Partner Violence:   . Fear of Current or Ex-Partner:   . Emotionally Abused:   Marland Kitchen Physically Abused:   . Sexually Abused:     No family history on file. Allergies  Allergen Reactions  . Ketamine Hives  . Nubain [Nalbuphine Hcl] Hives  . Stadol [Butorphanol] Hives   ? Current Facility-Administered Medications  Medication Dose Route Frequency Provider Last Rate Last Admin  .  0.9 %  sodium chloride infusion  75 mL/hr Intravenous Continuous Lindajo Royal V, MD      . 0.9 %  sodium chloride infusion   Intravenous Continuous Andris Baumann, MD 125 mL/hr at 07/05/19 0300 Rate Verify at 07/05/19 0300  . acetaminophen (TYLENOL) tablet 650 mg  650 mg Oral Q6H PRN Andris Baumann, MD       Or  . acetaminophen (TYLENOL) suppository 650 mg  650 mg Rectal Q6H PRN Andris Baumann, MD      . acyclovir (ZOVIRAX) 920 mg in dextrose 5 % 150 mL IVPB  920 mg Intravenous Q8H Hall, Scott A, RPH      . ampicillin (OMNIPEN) 2 g in sodium chloride 0.9 % 100 mL IVPB  2 g Intravenous Q4H Lindajo Royal V, MD 300 mL/hr at 07/05/19 0406 2 g at 07/05/19 0406  . cefTRIAXone (ROCEPHIN) 2 g in sodium chloride 0.9 % 100 mL IVPB  2 g Intravenous Q12H Andris Baumann, MD      . Chlorhexidine Gluconate Cloth 2 % PADS 6 each  6 each Topical Daily Andris Baumann, MD   6 each at 07/05/19 [phone]  . dexamethasone (DECADRON) injection 10 mg  10 mg Intravenous Q6H Andris Baumann, MD   10 mg at 07/05/19 0848  . insulin aspart (novoLOG) injection 0-15 Units  0-15 Units Subcutaneous TID WC Andris Baumann, MD   11 Units at 07/05/19 0848  . insulin aspart (novoLOG) injection 0-5 Units  0-5 Units Subcutaneous QHS Andris Baumann, MD   5 Units at 07/05/19 0259  . levETIRAcetam (KEPPRA) IVPB 500 mg/100 mL premix  500 mg Intravenous Q12H Lindajo Royal V, MD 400 mL/hr at 07/05/19 0851 500 mg at 07/05/19 0851  . LORazepam (ATIVAN) injection 1-2 mg  1-2 mg Intravenous Q2H PRN Andris Baumann, MD      . MEDLINE mouth rinse  15 mL Mouth Rinse BID Manuela Schwartz, NP   15 mL at 07/05/19 0854  . ondansetron (ZOFRAN) tablet 4 mg  4 mg Oral Q6H PRN Andris Baumann, MD       Or  . ondansetron [facility]) injection 4 mg  4 mg Intravenous Q6H PRN Andris Baumann, MD      . vancomycin (VANCOCIN) IVPB 1000 mg/200 mL premix  1,000 mg Intravenous Q8H Bertram Savin, RPH         Abtx:  Anti-infectives (From admission, onward)    Start     Dose/Rate Route Frequency Ordered Stop   07/05/19 1000  cefTRIAXone (ROCEPHIN) 2 g in sodium chloride 0.9 % 100 mL IVPB     2 g 200 mL/hr over 30 Minutes Intravenous Every  12 hours 07/05/19 0051     07/05/19 1000  vancomycin (VANCOREADY) IVPB 750 mg/150 mL  Status:  Discontinued     750 mg 150 mL/hr over 60 Minutes Intravenous Every 12 hours 07/05/19 0429 07/05/19 0858   07/05/19 1000  vancomycin (VANCOREADY) IVPB 1500 mg/300 mL  Status:  Discontinued     1,500 mg 150 mL/hr over 120 Minutes Intravenous Every 12 hours 07/05/19 0858 07/05/19 0934   07/05/19 1000  vancomycin (VANCOCIN) IVPB 1000 mg/200 mL premix     1,000 mg 200 mL/hr over 60 Minutes Intravenous Every 8 hours 07/05/19 0934     07/05/19 0930  acyclovir (ZOVIRAX) 920 mg in dextrose 5 % 150 mL IVPB     920 mg 168.4 mL/hr over 60 Minutes Intravenous Every 8 hours 07/05/19 0424     07/05/19 0200  ampicillin (OMNIPEN) 2 g in sodium chloride 0.9 % 100 mL IVPB     2 g 300 mL/hr over 20 Minutes Intravenous Every 4 hours 07/05/19 0051     07/05/19 0130  acyclovir (ZOVIRAX) 1,100 mg in dextrose 5 % 250 mL IVPB  Status:  Discontinued     1,100 mg 272 mL/hr over 60 Minutes Intravenous Every 8 hours 07/05/19 0046 07/05/19 0424   07/04/19 2100  vancomycin (VANCOREADY) IVPB 2000 mg/400 mL     2,000 mg 200 mL/hr over 120 Minutes Intravenous  Once 07/04/19 2020 07/05/19 0156   07/04/19 2030  cefTRIAXone (ROCEPHIN) 2 g in sodium chloride 0.9 % 100 mL IVPB     2 g 200 mL/hr over 30 Minutes Intravenous  Once 07/04/19 2018 07/05/19 0126      REVIEW OF SYSTEMS:   Patient is agitated sometimes more somnolent and irritable and wanting buprenorphine hence unable to get a proper review of system. He said he had low-grade fever And he has is unable to focus his eyes Is unable to see clearly my face but could see my fingers No cough or shortness of breath t Resp: negative cough, hemoptysis, dyspnea Cards: negative for chest pain,  palpitations, lower extremity edema GU: negative for frequency, dysuria and hematuria GI: Negative for abdominal pain, diarrhea, bleeding, constipation Skin: negative for rash and pruritus Heme: negative for easy bruising and gum/nose bleeding MS: Weakness Neurolo: Frequent falls Psych: Opioid dependence and is followed by Dr. Betti [facility] at [facility] for [facility]uprenorphine. Endocrine: Poorly controlled diabetes Allergy/Immunology-as above Patient says he did heroin.  He also said that he had a similar presentation a few years ago when he was taking Demerol Objective:  VITALS:  BP 139/89   Pulse 84   Temp 97.8 F (36.6 C) (Axillary)   Resp 14   Ht 6\' 3"  (1.905 m)   Wt 102.7 kg   SpO2 96%   BMI 28.30 kg/m  PHYSICAL EXAM:  General: Somnolent, on waking up aggressive agitated no neck rigidity oriented to person place.  Responds to questions appropriately. Head: Normocephalic, without obvious abnormality, atraumatic. Eyes: Divergent gaze.  Unable to examine his pupil properly as he keeps closing his eyes Bilateral parotid enlargement  ENT Nares normal. No drainage or sinus tenderness. Was not able to examine his oral cavity Neck: Supple,no carotid bruit and no JVD. Back: No CVA tenderness. Lungs: Bilateral air entry Heart: S1-S2 tachycardia Abdomen: Soft, non-tender,not distended. Bowel sounds normal. No masses Extremities: atraumatic, no cyanosis. No edema. No clubbing Skin: No rashes or lesions. Or bruising Lymph: Cervical, supraclavicular normal. Neurologic: Gaze to the left intermittently with divergent gaze  pertinent  Labs Lab Results CBC    Component Value Date/Time   WBC 13.3 (H) 07/05/2019 0743   RBC 5.19 07/05/2019 0743   HGB 11.2 (L) 07/05/2019 0743   HCT 34.5 (L) 07/05/2019 0743   PLT 307 07/05/2019 0743   MCV 66.5 (L) 07/05/2019 0743   MCH 21.6 (L) 07/05/2019 0743   MCHC 32.5 07/05/2019 0743   RDW 16.2 (H) 07/05/2019 0743   LYMPHSABS 3.3 07/04/2019 1832   MONOABS 2.1 (H)  07/04/2019 1832   EOSABS 0.0 07/04/2019 1832   BASOSABS 0.1 07/04/2019 1832    CMP Latest Ref Rng & Units 07/05/2019 07/04/2019 07/03/2019  Glucose 70 - 99 mg/dL 409(W348(H) 119(J348(H) 478(G443(H)  BUN 6 - 20 mg/dL 19 95(A22(H) 21(H28(H)  Creatinine 0.61 - 1.24 mg/dL 0.861.18 5.78(I1.48(H) 6.96(E1.71(H)  Sodium 135 - 145 mmol/L 135 129(L) 128(L)  Potassium 3.5 - 5.1 mmol/L 5.1 4.0 4.0  Chloride 98 - 111 mmol/L 105 96(L) 93(L)  CO2 22 - 32 mmol/L 20(L) 22 23  Calcium 8.9 - 10.3 mg/dL 9.5(M8.6(L) 8.9 8.9  Total Protein 6.5 - 8.1 g/dL - 8.1 -  Total Bilirubin 0.3 - 1.2 mg/dL - 1.6(H) -  Alkaline Phos 38 - 126 U/L - 116 -  AST 15 - 41 U/L - 27 -  ALT 0 - 44 U/L - 17 -      Microbiology: Recent Results (from the past 240 hour(s))  Blood culture (routine x 2)     Status: None (Preliminary result)   Collection Time: 07/04/19  8:24 PM   Specimen: BLOOD  Result Value Ref Range Status   Specimen Description BLOOD RIGHT ANTECUBITAL  Final   Special Requests   Final    BOTTLES DRAWN AEROBIC AND ANAEROBIC Blood Culture adequate volume   Culture   Final    NO GROWTH < 12 HOURS Performed at [facility], [address] [place], [place] [address]    Report Status PENDING  Incomplete  Blood culture (routine x 2)     Status: None (Preliminary result)   Collection Time: 07/04/19  8:27 PM   Specimen: BLOOD  Result Value Ref Range Status   Specimen Description BLOOD LEFT ANTECUBITAL  Final   Special Requests   Final    BOTTLES DRAWN AEROBIC AND ANAEROBIC Blood Culture adequate volume   Culture   Final    NO GROWTH < 12 HOURS Performed at [facility], [address] [place], [place] [address]    Report Status PENDING  Incomplete  Respiratory Panel by RT PCR (Flu A&B, Covid) - Nasopharyngeal Swab     Status: None   Collection Time: 07/04/19  9:34 PM   Specimen: Nasopharyngeal Swab  Result Value Ref Range Status   SARS Coronavirus 2 by RT PCR NEGATIVE NEGATIVE Final    Comment: (NOTE) SARS-CoV-2 target  nucleic acids are NOT DETECTED. The SARS-CoV-2 RNA is generally detectable in upper respiratoy specimens during the acute phase of infection. The lowest concentration of SARS-CoV-2 viral copies this assay can detect is 131 copies/mL. A negative result does not preclude SARS-Cov-2 infection and should not be used as the sole basis for treatment or other patient management decisions. A negative result may occur with  improper specimen collection/handling, submission of specimen other than nasopharyngeal swab, presence of viral mutation(s) within the areas targeted by this assay, and inadequate number of viral copies (<131 copies/mL). A negative result must be combined with clinical observations, patient history, and epidemiological information. The expected result is Negative. Fact  Sheet for Patients:  PinkCheek.be Fact Sheet for Healthcare Providers:  GravelBags.it This test is not yet ap proved or cleared by the [place] FDA and  has been authorized for detection and/or diagnosis of SARS-CoV-2 by FDA under an Emergency Use Authorization (EUA). This EUA will remain  in effect (meaning this test can be used) for the duration of the COVID-19 declaration under Section 564(b)(1) of the Act, 21 U.S.C. section 360bbb-3(b)(1), unless the authorization is terminated or revoked sooner.    Influenza A by PCR NEGATIVE NEGATIVE Final   Influenza B by PCR NEGATIVE NEGATIVE Final    Comment: (NOTE) The Xpert Xpress SARS-CoV-2/FLU/RSV assay is intended as an aid in  the diagnosis of influenza from Nasopharyngeal swab specimens and  should not be used as a sole basis for treatment. Nasal washings and  aspirates are unacceptable for Xpert Xpress SARS-CoV-2/FLU/RSV  testing. Fact Sheet for Patients: PinkCheek.be Fact Sheet for Healthcare Providers: GravelBags.it This test is not  yet approved or cleared by the [place] FDA and  has been authorized for detection and/or diagnosis of SARS-CoV-2 by  FDA under an Emergency Use Authorization (EUA). This EUA will remain  in effect (meaning this test can be used) for the duration of the  Covid-19 declaration under Section 564(b)(1) of the Act, 21  U.S.C. section 360bbb-3(b)(1), unless the authorization is  terminated or revoked. Performed at [facility], [address] [place], [place] [address]   MRSA PCR Screening     Status: None   Collection Time: 07/05/19  2:54 AM   Specimen: Nasal Mucosa; Nasopharyngeal  Result Value Ref Range Status   MRSA by PCR NEGATIVE NEGATIVE Final    Comment:        The GeneXpert MRSA Assay (FDA approved for NASAL specimens only), is one component of a comprehensive MRSA colonization surveillance program. It is not intended to diagnose MRSA infection nor to guide or monitor treatment for MRSA infections. Performed at [facility], [address] [place], [place] [address]     IMAGING RESULTS:  I have personally reviewed the films ? Impression/Recommendation ? Patient presenting with headache, falls, inability to focus his eyes with divergent gaze, high blood glucose, He has some form of a CNS syndrome.   CTA sinus venous thrombosis has been ruled out. Not sure whether this is a toxidrome versus infection versus CVA from sickle cell beta plus thalassemia versus seizures He will need MRI, EEG and LP Patient has refused LP An MRI is helpful to rule out rhomboencephalitis as well as temporal lobe enhancement/ infarction. MRI will help rule out herpes encephalitis and possibly Listeria if there is no rub encephalitis. He does not behave like a bacterial meningitis  For now until things are more clear we will continue with acyclovir, ampicillin and ceftriaxone.  Can stop vancomycin.  May be able to stop ceftriaxone and dexamethasone tomorrow  ?Discussed  the management with the primary team and the neurologist. ?  Note:  This document was prepared using Dragon voice recognition software and may include unintentional dictation errors.

## 2019-07-05 NOTE — Progress Notes (Addendum)
Pharmacy Antibiotic Note  John Haas is a 54 y.o. 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  Pharmacy has been consulted for Acyclovir dosing.  Plan: Acyclovir 10mg /Kg using AdjBW (920mg ) IV q8hrs (pt's weight was modified)  Height: 6\' 3"  (190.5 cm) Weight: (!) 149 kg (328 lb 7.8 oz) IBW/kg (Calculated) : 84.5  Temp (24hrs), Avg:98.5 F (36.9 C), Min:98.5 F (36.9 C), Max:98.5 F (36.9 C)  Recent Labs  Lab 07/03/19 2216 07/04/19 1832  WBC 19.7* 22.5*  CREATININE 1.71* 1.48*    Estimated Creatinine Clearance: 89 mL/min (A) (by C-G formula based on SCr of 1.48 mg/dL (H)).    Allergies  Allergen Reactions  . Ketamine Hives  . Nubain [Nalbuphine Hcl] Hives  . Stadol [Butorphanol] Hives    Antimicrobials this admission:   >>    >>   Dose adjustments this admission:   Microbiology results:  BCx:   UCx:    Sputum:    MRSA PCR:   Thank you for allowing pharmacy to be a part of this patient's care.  07/05/19 A 07/05/2019 12:58 AM

## 2019-07-05 NOTE — H&P (Signed)
History and Physical    John Haas ZOX:[id number] DOB: 02/05/1966 DOA: 07/04/2019  PCP: System, Pcp Not In   Patient coming from: home I have personally briefly reviewed patient's [facility]  Chief Complaint: Headache pain all over  HPI: John Haas is a 54 y.o. male with medical history significant for sickle thalassemia, COPD, diabetes and hypertension who presented to the emergency room with a complaint of headache and aching all over.  [facility] but left without being seen.  In the ER tonight he became belligerent at times and at times appeared confused.  Also while in the emergency room he was noted to have intermittent shaking of his left arm and leg with deviation of his head to the left but he remained awake and alert during these episodes. ED Course: Patient had an extensive work-up in the emergency room that was significant for white cell count of 22,500 up from his baseline of 17,000 and he had mild hyponatremia of 129.  Creatinine was 1.48 and blood sugar 348.  Flu and Covid test were negative.  He also had extensive imaging with CT venogram of the head, CT angio head and neck with and without contrast that showed no acute findings.  The emergency room provider [facility] with [facility] who did not think the findings had to do with his sickle thalassemia.  [facility] also [facility] who recommended giving Keppra for suspected partial seizures.  Patient received Keppra and Ativan in the emergency room.  He was also treated empirically for possible meningitis given his continued complaints of headache and slight worsening of his chronic leukocytosis.  Due to belligerence and uncooperation in the emergency room LP was not done, pending interventional radiology in the a.m.  Hospitalist consulted for admission.  Review of Systems: Unable to obtain as patient partly sedated from Ativan  Past Medical History:    Diagnosis Date  . COPD (chronic obstructive pulmonary disease) (HCC)   . Diabetes mellitus without complication (HCC)   . Heart attack (HCC)   . Hypertension   . 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 Sickle cell anemia (HCC)     Past Surgical History:  Procedure Laterality Date  . ANKLE SURGERY    . KNEE SURGERY    . PORT-A-CATH REMOVAL       reports that he has been smoking. He has never used smokeless tobacco. He reports that he does not drink alcohol or use drugs.  Allergies  Allergen Reactions  . Ketamine Hives  . Nubain [Nalbuphine Hcl] Hives  . Stadol [Butorphanol] Hives    No family history on file.   Prior to Admission medications   Medication Sig Start Date End Date Taking? Authorizing Provider  amoxicillin (AMOXIL) 500 MG capsule Take 1 capsule (500 mg total) by mouth 3 (three) times daily. 06/27/18   Enid DerryWagner, Ashley, PA-C  gabapentin (NEURONTIN) 300 MG capsule Take 1,200 mg by mouth 3 (three) times daily. 01/04/19   [provider]  Insulin Glargine (BASAGLAR KWIKPEN) 100 UNIT/ML SOPN Inject 35 Units into the skin 2 (two) times daily. 04/04/19   [provider]  lidocaine (XYLOCAINE) 2 % solution Use as directed 10 mLs in the mouth or throat as needed. 06/27/18   Enid DerryWagner, Ashley, PA-C  tamsulosin (FLOMAX) 0.4 MG CAPS capsule Take 0.4 mg by mouth daily. 02/11/19   [provider]  Physical Exam: Vitals:   07/04/19 1730 07/04/19 1835 07/04/19 2131 07/04/19 2300  BP:  116/71 119/72 (!) 148/81  Pulse:  (!) 110 94 99  Resp:  20 12 (!) 9  Temp:      TempSrc:      SpO2:  97% 94% 98%  Weight: (!) 149 kg     Height: 6\' 3"  (1.905 m)        Vitals:   07/04/19 1730 07/04/19 1835 07/04/19 2131 07/04/19 2300  BP:  116/71 119/72 (!) 148/81  Pulse:  (!) 110 94 99  Resp:  20 12 (!) 9  Temp:      TempSrc:      SpO2:  97% 94% 98%  Weight: (!) 149 kg     Height: 6\' 3"  (1.905 m)        Constitutional: Sedated from Ativan given in the emergency room but in no distress.  Awakes gentle shaking but curses out loudly at being bothered Eyes: PERLA, EOMI, irises appear normal, anicteric sclera,  ENMT: external ears and nose appear normal, Neck: neck appears normal, no masses, normal ROM, no thyromegaly, no JVD  CVS: S1-S2 clear, no murmur rubs or gallops,  , no carotid bruits, pedal pulses palpable, No LE edema Respiratory:  clear to auscultation bilaterally, no wheezing, rales or rhonchi. Respiratory effort normal. No accessory muscle use.  Abdomen: soft nontender, nondistended, normal bowel sounds, no hepatosplenomegaly, no hernias Musculoskeletal: : no cyanosis, clubbing , no contractures or atrophy Neuro: No gross focal deficits Skin: no rashes or lesions or ulcers, no induration or nodules   Labs on Admission: I have personally reviewed following labs and imaging studies  CBC: Recent Labs  Lab 07/03/19 2216 07/04/19 1832  WBC 19.7* 22.5*  NEUTROABS 13.6* 17.0*  HGB 11.1* 11.6*  HCT 33.8* 35.0*  MCV 65.9* 65.8*  PLT 352 352   Basic Metabolic Panel: Recent Labs  Lab 07/03/19 2216 07/04/19 1832  NA 128* 129*  K 4.0 4.0  CL 93* 96*  CO2 23 22  GLUCOSE 443* 348*  BUN 28* 22*  CREATININE 1.71* 1.48*  CALCIUM 8.9 8.9   GFR: Estimated Creatinine Clearance: 89 mL/min (A) (by C-G formula based on SCr of 1.48 mg/dL (H)). Liver Function Tests: Recent Labs  Lab 07/04/19 1832  AST 27  ALT 17  ALKPHOS 116  BILITOT 1.6*  PROT 8.1  ALBUMIN 3.9   No results for input(s): LIPASE, AMYLASE in the last 168 hours. No results for input(s): AMMONIA in the last 168 hours. Coagulation Profile: No results for input(s): INR, PROTIME in the last 168 hours. Cardiac Enzymes: No results for input(s): CKTOTAL, CKMB, CKMBINDEX, TROPONINI in the last 168 hours. BNP (last 3 results) No results for input(s): PROBNP in the last 8760 hours. HbA1C: No results for input(s):  HGBA1C in the last 72 hours. CBG: Recent Labs  Lab 07/03/19 1742 07/03/19 2228 07/03/19 2334 07/04/19 0029  GLUCAP 540* 420* 402* 318*   Lipid Profile: No results for input(s): CHOL, HDL, LDLCALC, TRIG, CHOLHDL, LDLDIRECT in the last 72 hours. Thyroid Function Tests: No results for input(s): TSH, T4TOTAL, FREET4, T3FREE, THYROIDAB in the last 72 hours. Anemia Panel: Recent Labs    07/03/19 2216 07/04/19 2024  RETICCTPCT 3.1 3.0   Urine analysis:    Component Value Date/Time   COLORURINE YELLOW (A) 07/04/2019 1944   APPEARANCEUR CLEAR (A) 07/04/2019 1944   LABSPEC 1.014 07/04/2019 1944   PHURINE 5.0 07/04/2019 1944   GLUCOSEU >=500 (A) 07/04/2019  1944   HGBUR SMALL (A) 07/04/2019 1944   BILIRUBINUR NEGATIVE 07/04/2019 1944   KETONESUR NEGATIVE 07/04/2019 1944   PROTEINUR NEGATIVE 07/04/2019 1944   NITRITE NEGATIVE 07/04/2019 1944   LEUKOCYTESUR NEGATIVE 07/04/2019 1944    Radiological Exams on Admission: CT Angio Head W or Wo Contrast  Result Date: 07/04/2019 CLINICAL DATA:  Weakness, syncope. EXAM: CT ANGIOGRAPHY HEAD AND NECK TECHNIQUE: Multidetector CT imaging of the head and neck was performed using the standard protocol during bolus administration of intravenous contrast. Multiplanar CT image reconstructions and MIPs were obtained to evaluate the vascular anatomy. Carotid stenosis measurements (when applicable) are obtained utilizing NASCET criteria, using the distal internal carotid diameter as the denominator. CONTRAST:  101mL OMNIPAQUE IOHEXOL 350 MG/ML SOLN COMPARISON:  None. FINDINGS: CT HEAD FINDINGS Brain: There is no mass, hemorrhage or extra-axial collection. The size and configuration of the ventricles and extra-axial CSF spaces are normal. 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 The brain parenchyma is normal. Skull: The visualized skull base, calvarium and extracranial soft tissues are normal. Sinuses/Orbits: No fluid levels or advanced mucosal  thickening of the visualized paranasal sinuses. No mastoid or middle ear effusion. The orbits are normal. CTA NECK FINDINGS SKELETON: There is no bony spinal canal stenosis. No lytic or blastic lesion. OTHER NECK: Normal pharynx, larynx and major salivary glands. No cervical lymphadenopathy. Unremarkable thyroid gland. UPPER CHEST: No pneumothorax or pleural effusion. No nodules or masses. AORTIC ARCH: There is no calcific atherosclerosis of the aortic arch. There is no aneurysm, dissection or hemodynamically significant stenosis of the visualized portion of the aorta. Conventional 3 vessel aortic branching pattern. The visualized proximal subclavian arteries are widely patent. RIGHT CAROTID SYSTEM: Normal without aneurysm, dissection or stenosis. LEFT CAROTID SYSTEM: Normal without aneurysm, dissection or stenosis. VERTEBRAL ARTERIES: Codominant configuration. Both origins are clearly patent. There is no dissection, occlusion or flow-limiting stenosis to the skull base (V1-V3 segments). CTA HEAD FINDINGS POSTERIOR CIRCULATION: --Vertebral arteries: Normal V4 segments. --Posterior inferior cerebellar arteries (PICA): Patent origins from the vertebral arteries. --Anterior inferior cerebellar arteries (AICA): Patent origins from the basilar artery. --Basilar artery: Normal. --Superior cerebellar arteries: Normal. --Posterior cerebral arteries: Normal. Both originate from the basilar artery. Posterior communicating arteries (p-comm) are diminutive or absent. ANTERIOR CIRCULATION: --Intracranial internal carotid arteries: Normal. --Anterior cerebral arteries (ACA): Normal. Both A1 segments are present. Patent anterior communicating artery (a-comm). --Middle cerebral arteries (MCA): Normal. VENOUS SINUSES: As permitted by contrast timing, patent. ANATOMIC VARIANTS: None Review of the MIP images confirms the above findings. IMPRESSION: 1. No emergent large vessel occlusion or high-grade stenosis. 2. 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 Electronically Signed   By: Deatra Robinson M.D.   On: 07/04/2019 21:25   CT Head Wo Contrast  Result Date: 07/04/2019 CLINICAL DATA:  54 year old male with history of headache. Multiple falls. Seizures. EXAM: CT HEAD WITHOUT CONTRAST TECHNIQUE: Contiguous axial images were obtained from the base of the skull through the vertex without intravenous contrast. COMPARISON:  Head CT 07/03/2019. FINDINGS: Brain: Patchy and confluent areas of decreased attenuation are noted throughout the deep and periventricular white matter of the cerebral hemispheres bilaterally, compatible with chronic microvascular ischemic disease. No evidence of acute infarction, hemorrhage, hydrocephalus, extra-axial collection or mass lesion/mass effect. Vascular: No hyperdense vessel or unexpected calcification. Skull: Normal. Negative for fracture or focal lesion. Sinuses/Orbits: No acute finding. Other: None. IMPRESSION: 1. No acute intracranial abnormalities. 2. Chronic microvascular ischemic changes in the cerebral white matter, as above. Electronically Signed   By:  Trudie Reed M.D.   On: 07/04/2019 18:13   CT Head Wo Contrast  Result Date: 07/03/2019 CLINICAL DATA:  Recent fall with headaches EXAM: CT HEAD WITHOUT CONTRAST TECHNIQUE: Contiguous axial images were obtained from the base of the skull through the vertex without intravenous contrast. COMPARISON:  None. FINDINGS: Brain: Mild atrophic changes are noted. Areas of prior ischemia are seen in the frontal lobes bilaterally. No findings to suggest acute hemorrhage, acute infarction or space-occupying mass lesion are seen. Vascular: No hyperdense vessel or unexpected calcification. Skull: Normal. Negative for fracture or focal lesion. Sinuses/Orbits: No acute finding. Other: None. IMPRESSION: Mild atrophic changes and areas of prior ischemia without acute abnormality. Electronically Signed   By: Alcide Clever M.D.   On: 07/03/2019 23:13   CT Angio  Neck W and/or Wo Contrast  Result Date: 07/04/2019 CLINICAL DATA:  Weakness, syncope. EXAM: CT ANGIOGRAPHY HEAD AND NECK TECHNIQUE: Multidetector CT imaging of the head and neck was performed using the standard protocol during bolus administration of intravenous contrast. Multiplanar CT image reconstructions and MIPs were obtained to evaluate the vascular anatomy. Carotid stenosis measurements (when applicable) are obtained utilizing NASCET criteria, using the distal internal carotid diameter as the denominator. CONTRAST:  63mL OMNIPAQUE IOHEXOL 350 MG/ML SOLN COMPARISON:  None. FINDINGS: CT HEAD FINDINGS Brain: There is no mass, hemorrhage or extra-axial collection. The size and configuration of the ventricles and extra-axial CSF spaces are normal. 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 The brain parenchyma is normal. Skull: The visualized skull base, calvarium and extracranial soft tissues are normal. Sinuses/Orbits: No fluid levels or advanced mucosal thickening of the visualized paranasal sinuses. No mastoid or middle ear effusion. The orbits are normal. CTA NECK FINDINGS SKELETON: There is no bony spinal canal stenosis. No lytic or blastic lesion. OTHER NECK: Normal pharynx, larynx and major salivary glands. No cervical lymphadenopathy. Unremarkable thyroid gland. UPPER CHEST: No pneumothorax or pleural effusion. No nodules or masses. AORTIC ARCH: There is no calcific atherosclerosis of the aortic arch. There is no aneurysm, dissection or hemodynamically significant stenosis of the visualized portion of the aorta. Conventional 3 vessel aortic branching pattern. The visualized proximal subclavian arteries are widely patent. RIGHT CAROTID SYSTEM: Normal without aneurysm, dissection or stenosis. LEFT CAROTID SYSTEM: Normal without aneurysm, dissection or stenosis. VERTEBRAL ARTERIES: Codominant configuration. Both origins are clearly patent. There is no dissection, occlusion or flow-limiting stenosis  to the skull base (V1-V3 segments). CTA HEAD FINDINGS POSTERIOR CIRCULATION: --Vertebral arteries: Normal V4 segments. --Posterior inferior cerebellar arteries (PICA): Patent origins from the vertebral arteries. --Anterior inferior cerebellar arteries (AICA): Patent origins from the basilar artery. --Basilar artery: Normal. --Superior cerebellar arteries: Normal. --Posterior cerebral arteries: Normal. Both originate from the basilar artery. Posterior communicating arteries (p-comm) are diminutive or absent. ANTERIOR CIRCULATION: --Intracranial internal carotid arteries: Normal. --Anterior cerebral arteries (ACA): Normal. Both A1 segments are present. Patent anterior communicating artery (a-comm). --Middle cerebral arteries (MCA): Normal. VENOUS SINUSES: As permitted by contrast timing, patent. ANATOMIC VARIANTS: None Review of the MIP images confirms the above findings. IMPRESSION: 1. No emergent large vessel occlusion or high-grade stenosis. 2. 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 Electronically Signed   By: Deatra Robinson M.D.   On: 07/04/2019 21:25   DG Chest Portable 1 View  Result Date: 07/04/2019 CLINICAL DATA:  Chest pain EXAM: PORTABLE CHEST 1 VIEW COMPARISON:  10/04/2018, 12/13/2017 FINDINGS: Cardiomegaly. Mild diffuse coarse chronic interstitial opacity. No acute focal airspace disease or effusion. Aortic atherosclerosis. No pneumothorax. IMPRESSION: Cardiomegaly. No edema  or infiltrate. Mild diffuse coarse chronic interstitial opacity Electronically Signed   By: Donavan Foil M.D.   On: 07/04/2019 18:19   CT VENOGRAM HEAD  Result Date: 07/04/2019 CLINICAL DATA:  Syncope and weakness EXAM: CT VENOGRAM HEAD TECHNIQUE: Vena graphic images of the head were obtained following the administration of intravenous contrast material. Multiplanar reformats and maximum intensity projections were constructed. CONTRAST:  66mL OMNIPAQUE IOHEXOL 350 MG/ML SOLN COMPARISON:  CTA head neck same day  FINDINGS: Superior sagittal sinus: Normal. Straight sinus: Normal. Inferior sagittal sinus, vein of Galen and internal cerebral veins: Normal. Transverse sinuses: Normal. Sigmoid sinuses: Normal. Visualized jugular veins: Normal. IMPRESSION: Normal head CT venogram. Electronically Signed   By: Ulyses Jarred M.D.   On: 07/04/2019 21:27    EKG: Independently reviewed.   Assessment/Plan Principal Problem:   Suspect Meningitis   Partial seizure ([place])   Headache   Acute confusion -Patient presenting with headache, acute confusion, belligerence, and partial seizures with shaking of the left arm and leg with turning of the head to the left, with leukocytosis -CT venogram, CT angio head and neck unremarkable.  Showed old bilateral frontal lobe subcortical infarcts -Continue Keppra as recommended by neurologist, Dr. Leonel Ramsay at [facility] per ED provider conversation(did initially try [facility] on [address] but no beds) -Ativan as needed seizure -Seizure precautions -Consult neurology -Consult infectious diseases -Interventional radiology, Dr. Anselm Pancoast for LP in the am , possibly with sedation in the a.m. -Continue vancomycin, Rocephin, ampicillin and acyclovir for empiric treatment of meningitis, pending further recommendations from infectious diseases -Will await neurology and ID input prior to ordering EEG    Sickle thalassemia disease ([place]) -No acute issues    COPD with acute bronchitis ([place]) -No acute issues -DuoNebs as needed  Hyperglycemia and type 2 diabetes mellitus without complication (HCC) -Insulin sliding scale coverage -Blood sugar 348.  Follow A1c  Hyponatremia -Sodium 129 but not believed to be contributing to -IV hydration with normal saline for suspected dehydration in view of elevated creatinine    DVT prophylaxis: Lovenox  Code Status: full code  Family Communication:  none  Disposition Plan: Back to previous home environment Consults called: Neurology, ID, interventional  radiology Status:inp    Athena Masse MD [facility]     07/05/2019, 12:51 AM

## 2019-07-05 NOTE — Progress Notes (Signed)
Pharmacy Antibiotic Note  John Haas is a 54 y.o. 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  Pharmacy has been consulted for Acyclovir and Vancomycin dosing.  Plan: Acyclovir 10mg /Kg using AdjBW (920mg ) IV q8hrs (pt's weight was modified)  Vancomycin 750 mg IV Q 12 hrs. Goal AUC 400-550. Expected AUC: 478.9, Css 14.4 SCr used: 1.48  Height: 6\' 3"  (190.5 cm) Weight: 102.7 kg (226 lb 6.6 oz) IBW/kg (Calculated) : 84.5  Temp (24hrs), Avg:98.2 F (36.8 C), Min:97.8 F (36.6 C), Max:98.5 F (36.9 C)  Recent Labs  Lab 07/03/19 2216 07/04/19 1832  WBC 19.7* 22.5*  CREATININE 1.71* 1.48*    Estimated Creatinine Clearance: 74.1 mL/min (A) (by C-G formula based on SCr of 1.48 mg/dL (H)).    Allergies  Allergen Reactions  . Ketamine Hives  . Nubain [Nalbuphine Hcl] Hives  . Stadol [Butorphanol] Hives    Antimicrobials this admission:   >>    >>   Dose adjustments this admission:   Microbiology results:  BCx:   UCx:    Sputum:    MRSA PCR:   Thank you for allowing pharmacy to be a part of this patient's care.  07/05/19 A 07/05/2019 4:29 AM

## 2019-07-05 NOTE — Progress Notes (Addendum)
Inpatient Diabetes Program Recommendations  AACE/ADA: New Consensus Statement on Inpatient Glycemic Control   Target Ranges:  Prepandial:   less than 140 mg/dL      Peak postprandial:   less than 180 mg/dL (1-2 hours)      Critically ill patients:  140 - 180 mg/dL   Results for LENIN, KUHNLE (MRN [id number]) as of 07/05/2019 10:02  Ref. Range 07/04/2019 00:29 07/05/2019 02:31 07/05/2019 02:57 07/05/2019 07:41  Glucose-Capillary Latest Ref Range: 70 - 99 mg/dL 562 (H) 563 (H) 893 (H) 302 (H)   Review of Glycemic Control  Diabetes history: DM2 Outpatient Diabetes medications: Basaglar 35 units BID, Novolog  Current orders for Inpatient glycemic control: Novolog 0-15 units TID with meals, Novolog 0-5 units QHS; Decadron 10 mg Q6H  Inpatient Diabetes Program Recommendations:   Insulin - Basal: If steroids are continued as ordered, please consider ordering Levemir 25 units BID.  Correction (SSI): Please consider increasing Novolog correction to 0-20 units AC&HS.  HbgA1C: A1C in process.  Thanks, Orlando Penner, RN, MSN, CDE Diabetes Coordinator Inpatient Diabetes Program [phone] (Team Pager from 8am to 5pm)

## 2019-07-05 NOTE — Progress Notes (Signed)
Pharmacy Antibiotic Note  John Haas is a 54 y.o. 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  Pharmacy has been consulted for Acyclovir and Vancomycin dosing.  Plan: Acyclovir 10mg /Kg using AdjBW (920mg ) IV q8hrs (pt's weight was modified)  Vancomycin 1000 mg IV Q 8 hrs. Goal AUC 400-550. Expected AUC: 538.1, Css 17.6 SCr used: 1.18  Height: 6\' 3"  (190.5 cm) Weight: 102.7 kg (226 lb 6.6 oz) IBW/kg (Calculated) : 84.5  Temp (24hrs), Avg:98.2 F (36.8 C), Min:97.8 F (36.6 C), Max:98.5 F (36.9 C)  Recent Labs  Lab 07/03/19 2216 07/04/19 1832 07/05/19 0503 07/05/19 0743  WBC 19.7* 22.5*  --  13.3*  CREATININE 1.71* 1.48* 1.18  --     Estimated Creatinine Clearance: 92.9 mL/min (by C-G formula based on SCr of 1.18 mg/dL).    Allergies  Allergen Reactions  . Ketamine Hives  . Nubain [Nalbuphine Hcl] Hives  . Stadol [Butorphanol] Hives    Antimicrobials this admission:  4/27 vancomycin>>  4/28 acyclovir >>  4/28 ampicillin >> 4/28 ceftriaxone >>  Dose adjustments this admission:  Microbiology results: 4/27 BCx: no growth <12 hours  4/27 Covid: negative  4/28 MRSA PCR:  negative 4/28 CSF: pending  Thank you for allowing pharmacy to be a part of this patient's care.  5/27 07/05/2019 11:51 AM

## 2019-07-06 ENCOUNTER — Inpatient Hospital Stay: Payer: Medicare Other

## 2019-07-06 DIAGNOSIS — L97519 Non-pressure chronic ulcer of other part of right foot with unspecified severity: Secondary | ICD-10-CM | POA: Diagnosis not present

## 2019-07-06 DIAGNOSIS — G039 Meningitis, unspecified: Secondary | ICD-10-CM

## 2019-07-06 DIAGNOSIS — Z9181 History of falling: Secondary | ICD-10-CM | POA: Diagnosis not present

## 2019-07-06 DIAGNOSIS — R519 Headache, unspecified: Secondary | ICD-10-CM | POA: Diagnosis not present

## 2019-07-06 DIAGNOSIS — G9340 Encephalopathy, unspecified: Secondary | ICD-10-CM

## 2019-07-06 DIAGNOSIS — G40209 Localization-related (focal) (partial) symptomatic epilepsy and epileptic syndromes with complex partial seizures, not intractable, without status epilepticus: Secondary | ICD-10-CM

## 2019-07-06 LAB — GLUCOSE, CAPILLARY
Glucose-Capillary: 264 mg/dL — ABNORMAL HIGH (ref 70–99)
Glucose-Capillary: 248 mg/dL — ABNORMAL HIGH (ref 70–99)
Glucose-Capillary: 248 mg/dL — ABNORMAL HIGH (ref 70–99)
Glucose-Capillary: 249 mg/dL — ABNORMAL HIGH (ref 70–99)

## 2019-07-06 LAB — HEMOGLOBIN A1C

## 2019-07-06 LAB — RPR: RPR Ser Ql: NONREACTIVE

## 2019-07-06 MED ORDER — GADOBUTROL 1 MMOL/ML IV SOLN
10.0000 mL | Freq: Once | INTRAVENOUS | Status: AC | PRN
  Administered 2019-07-08: 10 mL via INTRAVENOUS

## 2019-07-06 MED ORDER — SODIUM CHLORIDE 0.9% FLUSH: 10.0000 mL | INTRAVENOUS | Status: DC | PRN

## 2019-07-06 MED ORDER — SODIUM CHLORIDE 0.9 % IV SOLN
1500.0000 mg | Freq: Once | INTRAVENOUS | Status: AC
  Administered 2019-07-06: 1500 mg via INTRAVENOUS
  Filled 2019-07-06: qty 30

## 2019-07-06 MED ORDER — PHENYTOIN SODIUM 50 MG/ML IJ SOLN
100.0000 mg | Freq: Three times a day (TID) | INTRAMUSCULAR | Status: DC
  Administered 2019-07-07: 100 mg via INTRAVENOUS
  Filled 2019-07-06 (×3): qty 2

## 2019-07-06 MED ORDER — INSULIN DETEMIR 100 UNIT/ML ~~LOC~~ SOLN
25.0000 [IU] | Freq: Two times a day (BID) | SUBCUTANEOUS | Status: DC
  Administered 2019-07-06 (×2): 25 [IU] via SUBCUTANEOUS
  Filled 2019-07-06 (×4): qty 0.25

## 2019-07-06 MED ORDER — LEVETIRACETAM IN NACL 1000 MG/100ML IV SOLN
1000.0000 mg | Freq: Two times a day (BID) | INTRAVENOUS | Status: DC
  Administered 2019-07-06 – 2019-07-13 (×14): 1000 mg via INTRAVENOUS
  Filled 2019-07-06 (×16): qty 100

## 2019-07-06 NOTE — Progress Notes (Signed)
Subjective: Patient sedated prior to evaluation secondary to agitation.    Objective: Current vital signs: BP 116/78   Pulse (!) 102   Temp (!) 95.4 F (35.2 C) (Oral)   Resp 12   Ht 6\' 3"  (1.905 m)   Wt 102.7 kg   SpO2 95%   BMI 28.30 kg/m  Vital signs in last 24 hours: Temp:  [95.4 F (35.2 C)-98.3 F (36.8 C)] 95.4 F (35.2 C) (04/29 1045) Pulse Rate:  [96-102] 102 (04/29 0400) Resp:  [8-22] 12 (04/29 1100) BP: (111-170)/(76-138) 116/78 (04/29 1100) SpO2:  [94 %-100 %] 95 % (04/29 1100)  Intake/Output from previous day: 04/28 0701 - 04/29 0700 In: -  Out: 2450 [Urine:2450] Intake/Output this shift: Total I/O In: 4905.3 [I.V.:3048.9; IV Piggyback:1856.4] Out: 450 [Urine:450] Nutritional status:  Diet Order            Diet heart healthy/carb modified Room service appropriate? Yes; Fluid consistency: Thin  Diet effective now              Neurologic Exam: Mental Status: Lethargic.  Responds to sternal rub.  Follows some simple commands.   Cranial Nerves: II: patient does not respond confrontation bilaterally, pupils reactive bilaterally III,IV,VI: Oculocephalic response absent bilaterally.  V,VII: corneal reflex present bilaterally  VIII: unable to test IX,X: unable to test XII: unable to test Motor: Trace movement noted in all extremities  Lab Results: Basic Metabolic Panel: Recent Labs  Lab 07/03/19 2216 07/04/19 1832 07/05/19 0503  NA 128* 129* 135  K 4.0 4.0 5.1  CL 93* 96* 105  CO2 23 22 20*  GLUCOSE 443* 348* 348*  BUN 28* 22* 19  CREATININE 1.71* 1.48* 1.18  CALCIUM 8.9 8.9 8.6*    Liver Function Tests: Recent Labs  Lab 07/04/19 1832  AST 27  ALT 17  ALKPHOS 116  BILITOT 1.6*  PROT 8.1  ALBUMIN 3.9   No results for input(s): LIPASE, AMYLASE in the last 168 hours. No results for input(s): AMMONIA in the last 168 hours.  CBC: Recent Labs  Lab 07/03/19 2216 07/04/19 1832 07/05/19 0743  WBC 19.7* 22.5* 13.3*  NEUTROABS  13.6* 17.0*  --   HGB 11.1* 11.6* 11.2*  HCT 33.8* 35.0* 34.5*  MCV 65.9* 65.8* 66.5*  PLT 352 352 307    Cardiac Enzymes: No results for input(s): CKTOTAL, CKMB, CKMBINDEX, TROPONINI in the last 168 hours.  Lipid Panel: No results for input(s): CHOL, TRIG, HDL, CHOLHDL, VLDL, LDLCALC in the last 168 hours.  CBG: Recent Labs  Lab 07/05/19 1137 07/05/19 1635 07/05/19 2138 07/06/19 0718 07/06/19 1128  GLUCAP 258* 184* 223* 264* 248*    Microbiology: Results for orders placed or performed during the hospital encounter of 07/04/19  Blood culture (routine x 2)     Status: None (Preliminary result)   Collection Time: 07/04/19  8:24 PM   Specimen: BLOOD  Result Value Ref Range Status   Specimen Description BLOOD RIGHT ANTECUBITAL  Final   Special Requests   Final    BOTTLES DRAWN AEROBIC AND ANAEROBIC Blood Culture adequate volume   Culture   Final    NO GROWTH 2 DAYS Performed at [facility], [address] [place], [place]    Report Status PENDING  Incomplete  Blood culture (routine x 2)     Status: None (Preliminary result)   Collection Time: 07/04/19  8:27 PM   Specimen: BLOOD  Result Value Ref Range Status   Specimen Description BLOOD LEFT ANTECUBITAL  Final   Special Requests   Final    BOTTLES DRAWN AEROBIC AND ANAEROBIC Blood Culture adequate volume   Culture   Final    NO GROWTH 2 DAYS Performed at [facility], [address] [place], [place] [address]    Report Status PENDING  Incomplete  Respiratory Panel by RT PCR (Flu A&B, Covid) - Nasopharyngeal Swab     Status: None   Collection Time: 07/04/19  9:34 PM   Specimen: Nasopharyngeal Swab  Result Value Ref Range Status   SARS Coronavirus 2 by RT PCR NEGATIVE NEGATIVE Final    Comment: (NOTE) SARS-CoV-2 target nucleic acids are NOT DETECTED. The SARS-CoV-2 RNA is generally detectable in upper respiratoy specimens during the acute phase of infection. The  lowest concentration of SARS-CoV-2 viral copies this assay can detect is 131 copies/mL. A negative result does not preclude SARS-Cov-2 infection and should not be used as the sole basis for treatment or other patient management decisions. A negative result may occur with  improper specimen collection/handling, submission of specimen other than nasopharyngeal swab, presence of viral mutation(s) within the areas targeted by this assay, and inadequate number of viral copies (<131 copies/mL). A negative result must be combined with clinical observations, patient history, and epidemiological information. The expected result is Negative. Fact Sheet for Patients:  https://www.moore.com/https://www.fda.gov/media/142436/download Fact Sheet for Healthcare Providers:  https://www.young.biz/https://www.fda.gov/media/142435/download This test is not yet ap proved or cleared by the [place] FDA and  has been authorized for detection and/or diagnosis of SARS-CoV-2 by FDA under an Emergency Use Authorization (EUA). This EUA will remain  in effect (meaning this test can be used) for the duration of the COVID-19 declaration under Section 564(b)(1) of the Act, 21 U.S.C. section 360bbb-3(b)(1), unless the authorization is terminated or revoked sooner.    Influenza A by PCR NEGATIVE NEGATIVE Final   Influenza B by PCR NEGATIVE NEGATIVE Final    Comment: (NOTE) The Xpert Xpress SARS-CoV-2/FLU/RSV assay is intended as an aid in  the diagnosis of influenza from Nasopharyngeal swab specimens and  should not be used as a sole basis for treatment. Nasal washings and  aspirates are unacceptable for Xpert Xpress SARS-CoV-2/FLU/RSV  testing. Fact Sheet for Patients: https://www.moore.com/https://www.fda.gov/media/142436/download Fact Sheet for Healthcare Providers: https://www.young.biz/https://www.fda.gov/media/142435/download This test is not yet approved or cleared by the [place] FDA and  has been authorized for detection and/or diagnosis of SARS-CoV-2 by  FDA under an Emergency  Use Authorization (EUA). This EUA will remain  in effect (meaning this test can be used) for the duration of the  Covid-19 declaration under Section 564(b)(1) of the Act, 21  U.S.C. section 360bbb-3(b)(1), unless the authorization is  terminated or revoked. Performed at [facility], [address] [place], [place] [address]   MRSA PCR Screening     Status: None   Collection Time: 07/05/19  2:54 AM   Specimen: Nasal Mucosa; Nasopharyngeal  Result Value Ref Range Status   MRSA by PCR NEGATIVE NEGATIVE Final    Comment:        The GeneXpert MRSA Assay (FDA approved for NASAL specimens only), is one component of a comprehensive MRSA colonization surveillance program. It is not intended to diagnose MRSA infection nor to guide or monitor treatment for MRSA infections. Performed at [facility], [address] [place], [place] [address]     Coagulation Studies: No results for input(s): LABPROT, INR in the last 72 hours.  Imaging: CT Angio Head W or Wo Contrast  Result  Date: 07/04/2019 CLINICAL DATA:  Weakness, syncope. EXAM: CT ANGIOGRAPHY HEAD AND NECK TECHNIQUE: Multidetector CT imaging of the head and neck was performed using the standard protocol during bolus administration of intravenous contrast. Multiplanar CT image reconstructions and MIPs were obtained to evaluate the vascular anatomy. Carotid stenosis measurements (when applicable) are obtained utilizing NASCET criteria, using the distal internal carotid diameter as the denominator. CONTRAST:  61mL OMNIPAQUE IOHEXOL 350 MG/ML SOLN COMPARISON:  None. FINDINGS: CT HEAD FINDINGS Brain: There is no mass, hemorrhage or extra-axial collection. The size and configuration of the ventricles and extra-axial CSF spaces are normal. 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 The brain parenchyma is normal. Skull: The visualized skull base, calvarium and extracranial soft tissues are normal. Sinuses/Orbits: No fluid  levels or advanced mucosal thickening of the visualized paranasal sinuses. No mastoid or middle ear effusion. The orbits are normal. CTA NECK FINDINGS SKELETON: There is no bony spinal canal stenosis. No lytic or blastic lesion. OTHER NECK: Normal pharynx, larynx and major salivary glands. No cervical lymphadenopathy. Unremarkable thyroid gland. UPPER CHEST: No pneumothorax or pleural effusion. No nodules or masses. AORTIC ARCH: There is no calcific atherosclerosis of the aortic arch. There is no aneurysm, dissection or hemodynamically significant stenosis of the visualized portion of the aorta. Conventional 3 vessel aortic branching pattern. The visualized proximal subclavian arteries are widely patent. RIGHT CAROTID SYSTEM: Normal without aneurysm, dissection or stenosis. LEFT CAROTID SYSTEM: Normal without aneurysm, dissection or stenosis. VERTEBRAL ARTERIES: Codominant configuration. Both origins are clearly patent. There is no dissection, occlusion or flow-limiting stenosis to the skull base (V1-V3 segments). CTA HEAD FINDINGS POSTERIOR CIRCULATION: --Vertebral arteries: Normal V4 segments. --Posterior inferior cerebellar arteries (PICA): Patent origins from the vertebral arteries. --Anterior inferior cerebellar arteries (AICA): Patent origins from the basilar artery. --Basilar artery: Normal. --Superior cerebellar arteries: Normal. --Posterior cerebral arteries: Normal. Both originate from the basilar artery. Posterior communicating arteries (p-comm) are diminutive or absent. ANTERIOR CIRCULATION: --Intracranial internal carotid arteries: Normal. --Anterior cerebral arteries (ACA): Normal. Both A1 segments are present. Patent anterior communicating artery (a-comm). --Middle cerebral arteries (MCA): Normal. VENOUS SINUSES: As permitted by contrast timing, patent. ANATOMIC VARIANTS: None Review of the MIP images confirms the above findings. IMPRESSION: 1. No emergent large vessel occlusion or high-grade  stenosis. 2. 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 Electronically Signed   By: Deatra Robinson M.D.   On: 07/04/2019 21:25   CT Head Wo Contrast  Result Date: 07/04/2019 CLINICAL DATA:  54 year old male with history of headache. Multiple falls. Seizures. EXAM: CT HEAD WITHOUT CONTRAST TECHNIQUE: Contiguous axial images were obtained from the base of the skull through the vertex without intravenous contrast. COMPARISON:  Head CT 07/03/2019. FINDINGS: Brain: Patchy and confluent areas of decreased attenuation are noted throughout the deep and periventricular white matter of the cerebral hemispheres bilaterally, compatible with chronic microvascular ischemic disease. No evidence of acute infarction, hemorrhage, hydrocephalus, extra-axial collection or mass lesion/mass effect. Vascular: No hyperdense vessel or unexpected calcification. Skull: Normal. Negative for fracture or focal lesion. Sinuses/Orbits: No acute finding. Other: None. IMPRESSION: 1. No acute intracranial abnormalities. 2. Chronic microvascular ischemic changes in the cerebral white matter, as above. Electronically Signed   By: Trudie Reed M.D.   On: 07/04/2019 18:13   CT Angio Neck W and/or Wo Contrast  Result Date: 07/04/2019 CLINICAL DATA:  Weakness, syncope. EXAM: CT ANGIOGRAPHY HEAD AND NECK TECHNIQUE: Multidetector CT imaging of the head and neck was performed using the standard protocol during bolus administration of intravenous contrast.  Multiplanar CT image reconstructions and MIPs were obtained to evaluate the vascular anatomy. Carotid stenosis measurements (when applicable) are obtained utilizing NASCET criteria, using the distal internal carotid diameter as the denominator. CONTRAST:  30mL OMNIPAQUE IOHEXOL 350 MG/ML SOLN COMPARISON:  None. FINDINGS: CT HEAD FINDINGS Brain: There is no mass, hemorrhage or extra-axial collection. The size and configuration of the ventricles and extra-axial CSF spaces are normal. Old  bilateral frontal lobe subcortical infarcts. The brain parenchyma is normal. Skull: The visualized skull base, calvarium and extracranial soft tissues are normal. Sinuses/Orbits: No fluid levels or advanced mucosal thickening of the visualized paranasal sinuses. No mastoid or middle ear effusion. The orbits are normal. CTA NECK FINDINGS SKELETON: There is no bony spinal canal stenosis. No lytic or blastic lesion. OTHER NECK: Normal pharynx, larynx and major salivary glands. No cervical lymphadenopathy. Unremarkable thyroid gland. UPPER CHEST: No pneumothorax or pleural effusion. No nodules or masses. AORTIC ARCH: There is no calcific atherosclerosis of the aortic arch. There is no aneurysm, dissection or hemodynamically significant stenosis of the visualized portion of the aorta. Conventional 3 vessel aortic branching pattern. The visualized proximal subclavian arteries are widely patent. RIGHT CAROTID SYSTEM: Normal without aneurysm, dissection or stenosis. LEFT CAROTID SYSTEM: Normal without aneurysm, dissection or stenosis. VERTEBRAL ARTERIES: Codominant configuration. Both origins are clearly patent. There is no dissection, occlusion or flow-limiting stenosis to the skull base (V1-V3 segments). CTA HEAD FINDINGS POSTERIOR CIRCULATION: --Vertebral arteries: Normal V4 segments. --Posterior inferior cerebellar arteries (PICA): Patent origins from the vertebral arteries. --Anterior inferior cerebellar arteries (AICA): Patent origins from the basilar artery. --Basilar artery: Normal. --Superior cerebellar arteries: Normal. --Posterior cerebral arteries: Normal. Both originate from the basilar artery. Posterior communicating arteries (p-comm) are diminutive or absent. ANTERIOR CIRCULATION: --Intracranial internal carotid arteries: Normal. --Anterior cerebral arteries (ACA): Normal. Both A1 segments are present. Patent anterior communicating artery (a-comm). --Middle cerebral arteries (MCA): Normal. VENOUS SINUSES: As  permitted by contrast timing, patent. ANATOMIC VARIANTS: None Review of the MIP images confirms the above findings. IMPRESSION: 1. No emergent large vessel occlusion or high-grade stenosis. 2. 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 Electronically Signed   By: Deatra Robinson M.D.   On: 07/04/2019 21:25   DG Chest Portable 1 View  Result Date: 07/04/2019 CLINICAL DATA:  Chest pain EXAM: PORTABLE CHEST 1 VIEW COMPARISON:  10/04/2018, 12/13/2017 FINDINGS: Cardiomegaly. Mild diffuse coarse chronic interstitial opacity. No acute focal airspace disease or effusion. Aortic atherosclerosis. No pneumothorax. IMPRESSION: Cardiomegaly. No edema or infiltrate. Mild diffuse coarse chronic interstitial opacity Electronically Signed   By: Jasmine Pang M.D.   On: 07/04/2019 18:19   CT VENOGRAM HEAD  Result Date: 07/04/2019 CLINICAL DATA:  Syncope and weakness EXAM: CT VENOGRAM HEAD TECHNIQUE: Vena graphic images of the head were obtained following the administration of intravenous contrast material. Multiplanar reformats and maximum intensity projections were constructed. CONTRAST:  82mL OMNIPAQUE IOHEXOL 350 MG/ML SOLN COMPARISON:  CTA head neck same day FINDINGS: Superior sagittal sinus: Normal. Straight sinus: Normal. Inferior sagittal sinus, vein of Galen and internal cerebral veins: Normal. Transverse sinuses: Normal. Sigmoid sinuses: Normal. Visualized jugular veins: Normal. IMPRESSION: Normal head CT venogram. Electronically Signed   By: Deatra Robinson M.D.   On: 07/04/2019 21:27    Medications:  I have reviewed the patient's current medications. Scheduled: . buprenorphine-naloxone  3 tablet Sublingual Daily  . Chlorhexidine Gluconate Cloth  6 each Topical Daily  . dexamethasone (DECADRON) injection  10 mg Intravenous Q6H  . insulin aspart  0-15 Units Subcutaneous  TID WC  . insulin aspart  0-5 Units Subcutaneous QHS  . insulin detemir  25 Units Subcutaneous BID  . lidocaine  1 patch Transdermal  Q24H  . mouth rinse  15 mL Mouth Rinse BID  . nicotine  21 mg Transdermal Daily  . pantoprazole  40 mg Oral Daily  . tamsulosin  0.4 mg Oral Daily    Assessment/Plan: 54 year old male with medical history significant forsickle thalassemia, COPD, diabetes and hypertension who presented to the emergency room with a complaint of headache and aching all over.In the ED altered at times and with intermittent shaking of his left arm and leg with deviation of his head to the left while awake.  Felt to be partial seizures and started on Keppra.  Patient afebrile but with elevated white blood cell count.  Due to possibility of CNS infection patient started on broad spectrum antibiotics to include Acyclovir, Ampicillin and Ceftriaxone.  Decadron also initiated.   Today patient s/p sedation for agitation.  B12, RPR, folate are unremarkable.  TSH low.  WBC count improved but remains elevated.  MRI of the brain, EEG pending.   Patient with open sore on right foot.    Recommendations: 1. ID directing antibiotic treatment 2. MRI of the brain with and without contrast pending 3. EEG pending 4. Continue Keppra at current dose of 500mg  BID 5. Seizure precautions 6. Would further investigate wound on right foot and TSH abnormality     LOS: 1 day   Alexis Goodell, MD Neurology [phone] 07/06/2019  1:05 PM

## 2019-07-06 NOTE — Progress Notes (Addendum)
Updated Dr. Luberta Robertson about patient having episodes of outburst asking for ginger ale and having to pee and then shortly after outburst patient starts to stare up and toward left.  Patient is not having any shaking movements but because relaxed nonverbal and has incontinent episode. No further orders at this time.

## 2019-07-06 NOTE — Progress Notes (Signed)
PROGRESS NOTE    John Haas  HCW:[id number]  DOB: 1965-04-02  DOA: 07/04/2019 PCP: System, Pcp Not In Outpatient Specialists:   Hospital course:   John Haas is a 54 y.o. male with medical history significant for sickle thalassemia, COPD, diabetes and hypertension who presented to the emergency room with a complaint of headache and aching all over.  He apparently went to [facility] ED yesterday but left without being seen.  In the ER tonight he became belligerent at times and at times appeared confused.  Also while in the emergency room he was noted to have intermittent shaking of his left arm and leg with deviation of his head to the left but he remained awake and alert during these episodes.   Patient had an extensive work-up in the emergency room that was significant for white cell count of 22,500 up from his baseline of 17,000 and he had mild hyponatremia of 129.  Creatinine was 1.48 and blood sugar 348.  Flu and Covid test were negative.  He also had extensive imaging with CT venogram of the head, CT angio head and neck with and without contrast that showed no acute findings.  The emergency room provider spoke with hematology at [facility] who did not think the findings had to do with his sickle thalassemia.  He also spoke with neurology at [facility] health who recommended giving [place] for suspected partial seizures.  Patient received Keppra and Ativan in the emergency room.  He was also treated empirically for possible meningitis given his continued complaints of headache and slight worsening of his chronic leukocytosis.     Subjective:  Patient is lying in bed asleep.  He has recently been given Ativan for MRI.  I am unable to arouse him although he does become restless with sternal rub.  Noncommunicative.   Objective: Vitals:   07/06/19 1500 07/06/19 1600 07/06/19 1700 07/06/19 1800  BP: (!) 162/89 (!) 156/85  137/80  Pulse: (!) 110 (!) 101  (!) 108  Resp: 14 10  10   Temp:   (!) 96.3 F  (35.7 C)   TempSrc:   Axillary   SpO2: 100% 99%  97%  Weight:      Height:        Intake/Output Summary (Last 24 hours) at 07/06/2019 1835 Last data filed at 07/06/2019 1800 Gross per 24 hour  Intake 5847.15 ml  Output 2700 ml  Net 3147.15 ml   Filed Weights   07/04/19 1730 07/05/19 0300  Weight: (!) 149 kg 102.7 kg     Exam:  General: Patient is asleep on his back in no acute respiratory distress.  At other times however I have noticed a marked left gaze preference with head turning to the left. CVS: S1-S2, regular  Respiratory: Reasonable air entry bilaterally with transmitted upper respiratory sounds. GI: NABS, soft, NT  LE: No edema.  Neuro: At present patient is asleep however he has had intermittent left gaze preference as previously documented in neurology and ID notes.  In between times of left gaze preference, patient is awake and alert and communicative however is uncooperative and often aggressive.   Assessment & Plan:   54 year old with sickle cell/thalassemia presents with headache, what appears to be new onset focal left-sided seizures although cause of what appeared to be seizures is not clear.  Also of note patient is uncooperative with work-up.   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  Patient appears to have left-sided seizures associated with left  gaze preference and urination although patient is awake during these episodes and this is followed by a postictal state.  In between these episodes patient is awake alert and uncooperative, often aggressive and cursing.  When patient is lucid, he refuses LP and is uncooperative with further investigation.  His main concern seems to be getting his Suboxone which is ordered.  Very much appreciate ongoing input from neurology, infectious disease and hematology.  Patient underwent EEG today and seems to be having focal left-sided seizures.  MRI done today shows severe  motion artifact however does also show restricted diffusion in the right parietal/occipital and temporal lobes which is consistent with edema secondary to seizure and/or cerebritis.  Ischemia is also in the differential although it is less likely. Cause/focus of seizures is unclear.   Patient was seen by Dr. Thad Rangereynolds of neurology who has increased his Keppra dose and also has written for as needed Ativan for treatment of seizures as needed.   Infectious disease has been consulted and patient has been broadly covered for acyclovir, ceftriaxone and ampicillin. However patient has repeatedly refused LP so definitive diagnosis has not been made.  Hematology has also been consulted but they do not feel this is likely sickle cell/cell crisis given relatively stable outpatient course to date but they continue to follow closely.  MRI also noted to have a right subdural hygroma 5 mm, no mass-effect that appears to be new.  Chronic pain/substance abuse Continue Suboxone per home doses.  DM2 Sugars are under reasonable but not optimal control on SSI as written.  COPD No evidence for acute flare.  Sickle cell thalassemia disease As noted above, followed closely by Dr. Smith Robertao hematology Patient does not appear to be in crisis per her evaluation  Hyponatremia Resolved with hydration   DVT prophylaxis: SCD Code Status: Full Family Communication: Have attempted to call patient's sister but have been unsuccessful Disposition Plan:   Patient is from: Home  Anticipated Discharge Location: TBD  Barriers to Discharge: Acute ongoing seizures  Is patient medically stable for Discharge: No   Consultants:  Neurology  Infectious disease  Hematology  Procedures:  None so far, patient has refused LP multiple times  Antimicrobials:  Acyclovir  Ceftriaxone  Ampicillin   Data Reviewed:  Basic Metabolic Panel: Recent Labs  Lab 07/03/19 2216 07/04/19 1832 07/05/19 0503  NA 128* 129* 135   K 4.0 4.0 5.1  CL 93* 96* 105  CO2 23 22 20*  GLUCOSE 443* 348* 348*  BUN 28* 22* 19  CREATININE 1.71* 1.48* 1.18  CALCIUM 8.9 8.9 8.6*   Liver Function Tests: Recent Labs  Lab 07/04/19 1832  AST 27  ALT 17  ALKPHOS 116  BILITOT 1.6*  PROT 8.1  ALBUMIN 3.9   No results for input(s): LIPASE, AMYLASE in the last 168 hours. No results for input(s): AMMONIA in the last 168 hours. CBC: Recent Labs  Lab 07/03/19 2216 07/04/19 1832 07/05/19 0743  WBC 19.7* 22.5* 13.3*  NEUTROABS 13.6* 17.0*  --   HGB 11.1* 11.6* 11.2*  HCT 33.8* 35.0* 34.5*  MCV 65.9* 65.8* 66.5*  PLT 352 352 307   Cardiac Enzymes: No results for input(s): CKTOTAL, CKMB, CKMBINDEX, TROPONINI in the last 168 hours. BNP (last 3 results) No results for input(s): PROBNP in the last 8760 hours. CBG: Recent Labs  Lab 07/05/19 1635 07/05/19 2138 07/06/19 0718 07/06/19 1128 07/06/19 1622  GLUCAP 184* 223* 264* 248* 248*    Recent Results (from the past 240 hour(s))  Blood culture (routine x 2)     Status: None (Preliminary result)   Collection Time: 07/04/19  8:24 PM   Specimen: BLOOD  Result Value Ref Range Status   Specimen Description BLOOD RIGHT ANTECUBITAL  Final   Special Requests   Final    BOTTLES DRAWN AEROBIC AND ANAEROBIC Blood Culture adequate volume   Culture   Final    NO GROWTH 2 DAYS Performed at [facility], [address], [place], [place] [address]    Report Status PENDING  Incomplete  Blood culture (routine x 2)     Status: None (Preliminary result)   Collection Time: 07/04/19  8:27 PM   Specimen: BLOOD  Result Value Ref Range Status   Specimen Description BLOOD LEFT ANTECUBITAL  Final   Special Requests   Final    BOTTLES DRAWN AEROBIC AND ANAEROBIC Blood Culture adequate volume   Culture   Final    NO GROWTH 2 DAYS Performed at [facility], [address], [place], [place] [address]    Report Status PENDING  Incomplete  Respiratory Panel by RT  PCR (Flu A&B, Covid) - Nasopharyngeal Swab     Status: None   Collection Time: 07/04/19  9:34 PM   Specimen: Nasopharyngeal Swab  Result Value Ref Range Status   SARS Coronavirus 2 by RT PCR NEGATIVE NEGATIVE Final    Comment: (NOTE) SARS-CoV-2 target nucleic acids are NOT DETECTED. The SARS-CoV-2 RNA is generally detectable in upper respiratoy specimens during the acute phase of infection. The lowest concentration of SARS-CoV-2 viral copies this assay can detect is 131 copies/mL. A negative result does not preclude SARS-Cov-2 infection and should not be used as the sole basis for treatment or other patient management decisions. A negative result may occur with  improper specimen collection/handling, submission of specimen other than nasopharyngeal swab, presence of viral mutation(s) within the areas targeted by this assay, and inadequate number of viral copies (<131 copies/mL). A negative result must be combined with clinical observations, patient history, and epidemiological information. The expected result is Negative. Fact Sheet for Patients:  https://www.moore.com/ Fact Sheet for Healthcare Providers:  https://www.young.biz/ This test is not yet ap proved or cleared by the [place] FDA and  has been authorized for detection and/or diagnosis of SARS-CoV-2 by FDA under an Emergency Use Authorization (EUA). This EUA will remain  in effect (meaning this test can be used) for the duration of the COVID-19 declaration under Section 564(b)(1) of the Act, 21 U.S.C. section 360bbb-3(b)(1), unless the authorization is terminated or revoked sooner.    Influenza A by PCR NEGATIVE NEGATIVE Final   Influenza B by PCR NEGATIVE NEGATIVE Final    Comment: (NOTE) The Xpert Xpress SARS-CoV-2/FLU/RSV assay is intended as an aid in  the diagnosis of influenza from Nasopharyngeal swab specimens and  should not be used as a sole basis for treatment. Nasal  washings and  aspirates are unacceptable for Xpert Xpress SARS-CoV-2/FLU/RSV  testing. Fact Sheet for Patients: https://www.moore.com/ Fact Sheet for Healthcare Providers: https://www.young.biz/ This test is not yet approved or cleared by the [place] FDA and  has been authorized for detection and/or diagnosis of SARS-CoV-2 by  FDA under an Emergency Use Authorization (EUA). This EUA will remain  in effect (meaning this test can be used) for the duration of the  Covid-19 declaration under Section 564(b)(1) of the Act, 21  U.S.C. section 360bbb-3(b)(1), unless the authorization is  terminated or revoked. Performed at [facility], [address], [place], [place] [address]   MRSA PCR Screening     Status: None   Collection Time: 07/05/19  2:54 AM   Specimen: Nasal Mucosa; Nasopharyngeal  Result Value Ref Range Status   MRSA by PCR NEGATIVE NEGATIVE Final    Comment:        The GeneXpert MRSA Assay (FDA approved for NASAL specimens only), is one component of a comprehensive MRSA colonization surveillance program. It is not intended to diagnose MRSA infection nor to guide or monitor treatment for MRSA infections. Performed at [facility], [address] [place] [place], [place] [address]       Studies: EEG  Result Date: 07/06/2019 Thana Farr, MD     07/06/2019  4:17 PM ELECTROENCEPHALOGRAM REPORT Patient: Shameek Nyquist       Room #: IC13A-AA EEG No. ID: [id number] Age: 54 y.o.        Sex: male Requesting Physician: Luberta Robertson Report Date:  07/06/2019       Interpreting Physician: Thana Farr History: Thor Nannini is an 54 y.o. male with new onset seizures Medications: Acyclovir, Ampicillin, Suboxone, Rocephin, Decadron, Insulin, Keppra, Flomax, Protonix Conditions of Recording:  This is a 21 channel routine scalp EEG performed with bipolar and monopolar montages arranged in accordance to the international 10/20  system of electrode placement. One channel was dedicated to EKG recording. The patient is in the poorly responsive state. Description:  The background activity is slow and poorly organized.  This slow activity consists of a low voltage delta activity that is diffusely distributed.  There is superimposed beta activity noted as well.  The patient is positioned on his left.  Despite this there does appear some intermittent further slowing over the right temporoparietal region.   Frequent sharp waves are noted in this region as well.  The patient has periods when he further turns his head to the left and extensive artifact is noted.  Patient is able to respond to questioning at these times.  Patient also has periods of build up in beta activity over the right hemisphere without change clinically.  Hyperventilation and intermittent photic stimulation were not performed. IMPRESSION: This is an abnormal electroencephalogram secondary to general background slowing with focal slowing and sharp waves intermittently noted in the right temporoparietal region.  This is consistent with the patient's history of focal left sided seizures.  Patient also with episodes of fast beta activity-some with head turning but responsiveness and some with no change in clinical activity.  Difficult clear characterization but can not rule out focal seizure activity.  Thana Farr, MD Neurology [phone] 07/06/2019, 4:04 PM   CT Angio Head W or Wo Contrast  Result Date: 07/04/2019 CLINICAL DATA:  Weakness, syncope. EXAM: CT ANGIOGRAPHY HEAD AND NECK TECHNIQUE: Multidetector CT imaging of the head and neck was performed using the standard protocol during bolus administration of intravenous contrast. Multiplanar CT image reconstructions and MIPs were obtained to evaluate the vascular anatomy. Carotid stenosis measurements (when applicable) are obtained utilizing NASCET criteria, using the distal internal carotid diameter as the denominator.  CONTRAST:  60mL OMNIPAQUE IOHEXOL 350 MG/ML SOLN COMPARISON:  None. FINDINGS: CT HEAD FINDINGS Brain: There is no mass, hemorrhage or extra-axial collection. The size and configuration of the ventricles and extra-axial CSF spaces are normal. 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 The brain parenchyma is normal. Skull: The visualized skull base, calvarium and extracranial soft tissues are normal. Sinuses/Orbits: No fluid levels or advanced mucosal thickening of the visualized paranasal sinuses. No mastoid  or middle ear effusion. The orbits are normal. CTA NECK FINDINGS SKELETON: There is no bony spinal canal stenosis. No lytic or blastic lesion. OTHER NECK: Normal pharynx, larynx and major salivary glands. No cervical lymphadenopathy. Unremarkable thyroid gland. UPPER CHEST: No pneumothorax or pleural effusion. No nodules or masses. AORTIC ARCH: There is no calcific atherosclerosis of the aortic arch. There is no aneurysm, dissection or hemodynamically significant stenosis of the visualized portion of the aorta. Conventional 3 vessel aortic branching pattern. The visualized proximal subclavian arteries are widely patent. RIGHT CAROTID SYSTEM: Normal without aneurysm, dissection or stenosis. LEFT CAROTID SYSTEM: Normal without aneurysm, dissection or stenosis. VERTEBRAL ARTERIES: Codominant configuration. Both origins are clearly patent. There is no dissection, occlusion or flow-limiting stenosis to the skull base (V1-V3 segments). CTA HEAD FINDINGS POSTERIOR CIRCULATION: --Vertebral arteries: Normal V4 segments. --Posterior inferior cerebellar arteries (PICA): Patent origins from the vertebral arteries. --Anterior inferior cerebellar arteries (AICA): Patent origins from the basilar artery. --Basilar artery: Normal. --Superior cerebellar arteries: Normal. --Posterior cerebral arteries: Normal. Both originate from the basilar artery. Posterior communicating arteries (p-comm) are diminutive or absent. ANTERIOR  CIRCULATION: --Intracranial internal carotid arteries: Normal. --Anterior cerebral arteries (ACA): Normal. Both A1 segments are present. Patent anterior communicating artery (a-comm). --Middle cerebral arteries (MCA): Normal. VENOUS SINUSES: As permitted by contrast timing, patent. ANATOMIC VARIANTS: None Review of the MIP images confirms the above findings. IMPRESSION: 1. No emergent large vessel occlusion or high-grade stenosis. 2. 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 Electronically Signed   By: Deatra Robinson M.D.   On: 07/04/2019 21:25   DG Abd 1 View  Result Date: 07/06/2019 CLINICAL DATA:  Possible heavy metal poisoning EXAM: ABDOMEN - 1 VIEW COMPARISON:  None. FINDINGS: Scattered large and small bowel gas is noted. Mild retained fecal material is noted. No intraluminal radiopacities are seen. Diffuse thickening of the femoral cortex is noted bilaterally in a symmetrical fashion. This may be related to the patient's underlying sickle cell disease or possibly underlying Paget's. No other focal abnormality is seen. IMPRESSION: Thickened cortex within the femurs in a symmetrical fashion bilaterally likely related to the patient's underlying sickle cell disease or possible Paget's disease. No definitive radiopacities are noted within the bowel to correspond with the given clinical history. Electronically Signed   By: Alcide Clever M.D.   On: 07/06/2019 16:12   CT Angio Neck W and/or Wo Contrast  Result Date: 07/04/2019 CLINICAL DATA:  Weakness, syncope. EXAM: CT ANGIOGRAPHY HEAD AND NECK TECHNIQUE: Multidetector CT imaging of the head and neck was performed using the standard protocol during bolus administration of intravenous contrast. Multiplanar CT image reconstructions and MIPs were obtained to evaluate the vascular anatomy. Carotid stenosis measurements (when applicable) are obtained utilizing NASCET criteria, using the distal internal carotid diameter as the denominator. CONTRAST:  21mL  OMNIPAQUE IOHEXOL 350 MG/ML SOLN COMPARISON:  None. FINDINGS: CT HEAD FINDINGS Brain: There is no mass, hemorrhage or extra-axial collection. The size and configuration of the ventricles and extra-axial CSF spaces are normal. 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 The brain parenchyma is normal. Skull: The visualized skull base, calvarium and extracranial soft tissues are normal. Sinuses/Orbits: No fluid levels or advanced mucosal thickening of the visualized paranasal sinuses. No mastoid or middle ear effusion. The orbits are normal. CTA NECK FINDINGS SKELETON: There is no bony spinal canal stenosis. No lytic or blastic lesion. OTHER NECK: Normal pharynx, larynx and major salivary glands. No cervical lymphadenopathy. Unremarkable thyroid gland. UPPER CHEST: No pneumothorax or pleural effusion. No  nodules or masses. AORTIC ARCH: There is no calcific atherosclerosis of the aortic arch. There is no aneurysm, dissection or hemodynamically significant stenosis of the visualized portion of the aorta. Conventional 3 vessel aortic branching pattern. The visualized proximal subclavian arteries are widely patent. RIGHT CAROTID SYSTEM: Normal without aneurysm, dissection or stenosis. LEFT CAROTID SYSTEM: Normal without aneurysm, dissection or stenosis. VERTEBRAL ARTERIES: Codominant configuration. Both origins are clearly patent. There is no dissection, occlusion or flow-limiting stenosis to the skull base (V1-V3 segments). CTA HEAD FINDINGS POSTERIOR CIRCULATION: --Vertebral arteries: Normal V4 segments. --Posterior inferior cerebellar arteries (PICA): Patent origins from the vertebral arteries. --Anterior inferior cerebellar arteries (AICA): Patent origins from the basilar artery. --Basilar artery: Normal. --Superior cerebellar arteries: Normal. --Posterior cerebral arteries: Normal. Both originate from the basilar artery. Posterior communicating arteries (p-comm) are diminutive or absent. ANTERIOR CIRCULATION:  --Intracranial internal carotid arteries: Normal. --Anterior cerebral arteries (ACA): Normal. Both A1 segments are present. Patent anterior communicating artery (a-comm). --Middle cerebral arteries (MCA): Normal. VENOUS SINUSES: As permitted by contrast timing, patent. ANATOMIC VARIANTS: None Review of the MIP images confirms the above findings. IMPRESSION: 1. No emergent large vessel occlusion or high-grade stenosis. 2. 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 Electronically Signed   By: Deatra Robinson M.D.   On: 07/04/2019 21:25   MR BRAIN WO CONTRAST  Addendum Date: 07/06/2019   ADDENDUM REPORT: 07/06/2019 18:10 ADDENDUM: These results were called by telephone at the time of interpretation on 07/06/2019 at 6:10 pm to provider [facility] , who verbally acknowledged these results. Electronically Signed   By: Jackey Loge DO   On: 07/06/2019 18:10   Result Date: 07/06/2019 CLINICAL DATA:  Ophthalmoplegia; encephalopathy, seizure, abnormal neuro exam. Additional history provided: Prior history of sickle thalassemia, COPD, diabetes and hypertension, presenting with headache and aching all over, altered at times with intermittent shaking of left arm and leg with deviation head to the left while awake, findings felt to reflect partial seizure. EXAM: MRI HEAD WITHOUT CONTRAST TECHNIQUE: Multiplanar, multiecho pulse sequences of the brain and surrounding structures were obtained without intravenous contrast. COMPARISON:  CT venogram 07/04/2019, noncontrast head CT and CT angiogram head/neck 07/04/2019 FINDINGS: Brain: The patient was unable to tolerate the full examination. As a result only axial and coronal diffusion-weighted imaging, a sagittal T1 weighted sequence, an axial T2* sequence, an axial T2 FLAIR sequence and a coronal T2 weighted sequence were obtained. There is moderate/severe motion degradation of the sagittal T1 weighted sequence and axial T2 FLAIR sequence. There is severe motion  degradation of the coronal T2 weighted sequence. There is subtle cortical diffusion-weighted signal abnormality throughout much of the right parietal lobe and extending inferiorly to involve portions of the right occipital lobe and posteromedial right temporal lobe. There are a few more punctate foci of restricted diffusion within the high right parietal lobe (series 5, image 30). Evaluation is limited due to significant motion degradation, but there appears to be corresponding T2/FLAIR hyperintensity at these sites. 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 Multifocal T2/FLAIR hyperintensity within the cerebral white matter and pons is nonspecific but likely reflect sequela of chronic small vessel ischemia. There is suggestion of a thin right-sided subdural hygroma measuring approximately 5 mm in thickness greatest overlying the right frontal lobe (for instance as seen on series 8, image 16). This finding was not definitively present on prior exams. No intracranial mass is identified. No midline shift. No chronic intracranial blood products. Vascular: Poorly assessed on the acquired sequences. Skull and upper  cervical spine: No focal marrow lesion is identified within the limitations of significant motion degradation. Sinuses/Orbits: The orbits and paranasal sinuses are poorly assessed due to the degree of motion degradation. IMPRESSION: 1. Cortical diffusion-weighted signal abnormality and corresponding T2/FLAIR hyperintensity involving much of the right parietal lobe and extending inferiorly to involve portions of the right occipital lobe and posteromedial right temporal lobe. There are a few additional punctate foci of restricted diffusion within the high right parietal lobe. Findings are suspicious for seizure related changes and/or cerebritis. Acute ischemia cannot be excluded, particularly for the punctate foci within the high right parietal lobe. 2. Probable right-sided  subdural hygroma measuring 5 mm. This finding was not definitively present on prior exams. 3. 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 4. Chronic small vessel ischemic changes within the cerebral white matter and pons. Electronically Signed: By: Jackey Loge DO On: 07/06/2019 17:57   CT VENOGRAM HEAD  Result Date: 07/04/2019 CLINICAL DATA:  Syncope and weakness EXAM: CT VENOGRAM HEAD TECHNIQUE: Vena graphic images of the head were obtained following the administration of intravenous contrast material. Multiplanar reformats and maximum intensity projections were constructed. CONTRAST:  75mL OMNIPAQUE IOHEXOL 350 MG/ML SOLN COMPARISON:  CTA head neck same day FINDINGS: Superior sagittal sinus: Normal. Straight sinus: Normal. Inferior sagittal sinus, vein of [place] and internal cerebral veins: Normal. Transverse sinuses: Normal. Sigmoid sinuses: Normal. Visualized jugular veins: Normal. IMPRESSION: Normal head CT venogram. Electronically Signed   By: Deatra Robinson M.D.   On: 07/04/2019 21:27     Scheduled Meds: . buprenorphine-naloxone  3 tablet Sublingual Daily  . Chlorhexidine Gluconate Cloth  6 each Topical Daily  . dexamethasone (DECADRON) injection  10 mg Intravenous Q6H  . insulin aspart  0-15 Units Subcutaneous TID WC  . insulin aspart  0-5 Units Subcutaneous QHS  . insulin detemir  25 Units Subcutaneous BID  . mouth rinse  15 mL Mouth Rinse BID  . nicotine  21 mg Transdermal Daily  . pantoprazole  40 mg Oral Daily  . tamsulosin  0.4 mg Oral Daily   Continuous Infusions: . sodium chloride Stopped (07/06/19 1731)  . acyclovir 168 mL/hr at 07/06/19 1800  . ampicillin (OMNIPEN) IV Stopped (07/06/19 1614)  . levETIRAcetam      Principal Problem:   Suspect Meningitis Active Problems:   Sickle thalassemia disease (HCC)   Partial seizure (HCC)   Headache   COPD with acute bronchitis (HCC)   Type 2 diabetes mellitus without complication (HCC)    Acute confusion   Recurrent seizures (HCC)     Reid Regas Orma Flaming, [facility]  If 7PM-7AM, please contact night-coverage [email or web address] Password [facility] 07/06/2019, 6:35 PM    LOS: 1 day

## 2019-07-06 NOTE — Progress Notes (Signed)
Date of Admission:  07/04/2019     Pt lying in bed with eyes closed and getting intermittent rt side headache which is followed by left gaze and being less responsive This lasts for a few seconds and then he will respond appropriately and can talk  He still will not open eyes and when he does he cannot focus and has some divergence of the eye balls    Subjective: Patient Vitals for the past 24 hrs:  BP Temp Temp src Pulse Resp SpO2  07/06/19 1400 (!) 147/91 -- -- (!) 106 11 100 %  07/06/19 1300 119/81 -- -- (!) 103 15 99 %  07/06/19 1200 132/78 -- -- -- 12 --  07/06/19 1100 116/78 -- -- -- 12 95 %  07/06/19 1045 -- (!) 95.4 F (35.2 C) Oral -- -- --  07/06/19 1000 (!) 150/104 -- -- -- 13 94 %  07/06/19 0900 135/88 -- -- -- 17 94 %  07/06/19 0800 (!) 138/93 -- -- -- 20 --  07/06/19 0700 (!) 170/138 -- -- -- 13 --  07/06/19 0600 (!) 166/91 -- -- -- (!) 22 --  07/06/19 0500 (!) 147/97 -- -- -- 15 --  07/06/19 0400 (!) 145/99 98 F (36.7 C) Axillary (!) 102 (!) 9 98 %  07/06/19 0300 (!) 155/87 -- -- 96 12 100 %  07/06/19 0200 (!) 146/79 -- -- -- 15 --  07/06/19 0100 132/80 98.3 F (36.8 C) Axillary -- (!) 8 --  07/06/19 0010 135/86 97.7 F (36.5 C) Axillary -- 10 --  07/05/19 2300 111/76 -- -- -- 15 --  07/05/19 2200 124/76 -- -- -- 13 --  07/05/19 2100 124/79 -- -- -- 10 --  07/05/19 2005 137/87 97.6 F (36.4 C) Oral -- 15 95 %  07/05/19 1500 131/81 -- -- -- 15 --    Medications:   Objective: Vital signs in last 24 hours: Temp:  [95.4 F (35.2 C)-98.3 F (36.8 C)] 95.4 F (35.2 C) (04/29 1045) Pulse Rate:  [96-106] 106 (04/29 1400) Resp:  [8-22] 11 (04/29 1400) BP: (111-170)/(76-138) 147/91 (04/29 1400) SpO2:  [94 %-100 %] 100 % (04/29 1400)  PHYSICAL EXAM:  General:drowsy, lying with eyes closed- but on calling his name he gives appropriate response wants Head: Normocephalic, without obvious abnormality, atraumatic. Eyes: closed- on opening has dysconjugate  gaze  Neck: Supple, symmetrical, no adenopathy,   Lungs: Clear to auscultation bilaterally. No Wheezing or Rhonchi. No rales. Heart: Regular rate and rhythm, no murmur, rub or gallop.tachycardia Abdomen: Soft, non-tender,not distended. Bowel sounds normal. No masses Extremities:    Skin: No rashes or lesions. Or bruising Lymph: Cervical, supraclavicular normal. Neurologic: Grossly non-focal  Lab Results Recent Labs    07/04/19 1832 07/05/19 0503 07/05/19 0743  WBC 22.5*  --  13.3*  HGB 11.6*  --  11.2*  HCT 35.0*  --  34.5*  NA 129* 135  --   K 4.0 5.1  --   CL 96* 105  --   CO2 22 20*  --   BUN 22* 19  --   CREATININE 1.48* 1.18  --    Liver Panel Recent Labs    07/04/19 1832  PROT 8.1  ALBUMIN 3.9  AST 27  ALT 17  ALKPHOS 116  BILITOT 1.6*   Sedimentation Rate No results for input(s): ESRSEDRATE in the last 72 hours. C-Reactive Protein No results for input(s): CRP in the last 72 hours.  Microbiology: BC-NG Studies/Results: CT Angio Head  W or Wo Contrast  Result Date: 07/04/2019 CLINICAL DATA:  Weakness, syncope. EXAM: CT ANGIOGRAPHY HEAD AND NECK TECHNIQUE: Multidetector CT imaging of the head and neck was performed using the standard protocol during bolus administration of intravenous contrast. Multiplanar CT image reconstructions and MIPs were obtained to evaluate the vascular anatomy. Carotid stenosis measurements (when applicable) are obtained utilizing NASCET criteria, using the distal internal carotid diameter as the denominator. CONTRAST:  75mL OMNIPAQUE IOHEXOL 350 MG/ML SOLN COMPARISON:  None. FINDINGS: CT HEAD FINDINGS Brain: There is no mass, hemorrhage or extra-axial collection. The size and configuration of the ventricles and extra-axial CSF spaces are normal. 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 The brain parenchyma is normal. Skull: The visualized skull base, calvarium and extracranial soft tissues are normal. Sinuses/Orbits: No fluid  levels or advanced mucosal thickening of the visualized paranasal sinuses. No mastoid or middle ear effusion. The orbits are normal. CTA NECK FINDINGS SKELETON: There is no bony spinal canal stenosis. No lytic or blastic lesion. OTHER NECK: Normal pharynx, larynx and major salivary glands. No cervical lymphadenopathy. Unremarkable thyroid gland. UPPER CHEST: No pneumothorax or pleural effusion. No nodules or masses. AORTIC ARCH: There is no calcific atherosclerosis of the aortic arch. There is no aneurysm, dissection or hemodynamically significant stenosis of the visualized portion of the aorta. Conventional 3 vessel aortic branching pattern. The visualized proximal subclavian arteries are widely patent. RIGHT CAROTID SYSTEM: Normal without aneurysm, dissection or stenosis. LEFT CAROTID SYSTEM: Normal without aneurysm, dissection or stenosis. VERTEBRAL ARTERIES: Codominant configuration. Both origins are clearly patent. There is no dissection, occlusion or flow-limiting stenosis to the skull base (V1-V3 segments). CTA HEAD FINDINGS POSTERIOR CIRCULATION: --Vertebral arteries: Normal V4 segments. --Posterior inferior cerebellar arteries (PICA): Patent origins from the vertebral arteries. --Anterior inferior cerebellar arteries (AICA): Patent origins from the basilar artery. --Basilar artery: Normal. --Superior cerebellar arteries: Normal. --Posterior cerebral arteries: Normal. Both originate from the basilar artery. Posterior communicating arteries (p-comm) are diminutive or absent. ANTERIOR CIRCULATION: --Intracranial internal carotid arteries: Normal. --Anterior cerebral arteries (ACA): Normal. Both A1 segments are present. Patent anterior communicating artery (a-comm). --Middle cerebral arteries (MCA): Normal. VENOUS SINUSES: As permitted by contrast timing, patent. ANATOMIC VARIANTS: None Review of the MIP images confirms the above findings. IMPRESSION: 1. No emergent large vessel occlusion or high-grade  stenosis. 2. 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 Electronically Signed   By: Deatra Robinson M.D.   On: 07/04/2019 21:25   CT Head Wo Contrast  Result Date: 07/04/2019 CLINICAL DATA:  54 year old male with history of headache. Multiple falls. Seizures. EXAM: CT HEAD WITHOUT CONTRAST TECHNIQUE: Contiguous axial images were obtained from the base of the skull through the vertex without intravenous contrast. COMPARISON:  Head CT 07/03/2019. FINDINGS: Brain: Patchy and confluent areas of decreased attenuation are noted throughout the deep and periventricular white matter of the cerebral hemispheres bilaterally, compatible with chronic microvascular ischemic disease. No evidence of acute infarction, hemorrhage, hydrocephalus, extra-axial collection or mass lesion/mass effect. Vascular: No hyperdense vessel or unexpected calcification. Skull: Normal. Negative for fracture or focal lesion. Sinuses/Orbits: No acute finding. Other: None. IMPRESSION: 1. No acute intracranial abnormalities. 2. Chronic microvascular ischemic changes in the cerebral white matter, as above. Electronically Signed   By: Trudie Reed M.D.   On: 07/04/2019 18:13   CT Angio Neck W and/or Wo Contrast  Result Date: 07/04/2019 CLINICAL DATA:  Weakness, syncope. EXAM: CT ANGIOGRAPHY HEAD AND NECK TECHNIQUE: Multidetector CT imaging of the head and neck was performed using the standard protocol  during bolus administration of intravenous contrast. Multiplanar CT image reconstructions and MIPs were obtained to evaluate the vascular anatomy. Carotid stenosis measurements (when applicable) are obtained utilizing NASCET criteria, using the distal internal carotid diameter as the denominator. CONTRAST:  66mL OMNIPAQUE IOHEXOL 350 MG/ML SOLN COMPARISON:  None. FINDINGS: CT HEAD FINDINGS Brain: There is no mass, hemorrhage or extra-axial collection. The size and configuration of the ventricles and extra-axial CSF spaces are normal. Old  bilateral frontal lobe subcortical infarcts. The brain parenchyma is normal. Skull: The visualized skull base, calvarium and extracranial soft tissues are normal. Sinuses/Orbits: No fluid levels or advanced mucosal thickening of the visualized paranasal sinuses. No mastoid or middle ear effusion. The orbits are normal. CTA NECK FINDINGS SKELETON: There is no bony spinal canal stenosis. No lytic or blastic lesion. OTHER NECK: Normal pharynx, larynx and major salivary glands. No cervical lymphadenopathy. Unremarkable thyroid gland. UPPER CHEST: No pneumothorax or pleural effusion. No nodules or masses. AORTIC ARCH: There is no calcific atherosclerosis of the aortic arch. There is no aneurysm, dissection or hemodynamically significant stenosis of the visualized portion of the aorta. Conventional 3 vessel aortic branching pattern. The visualized proximal subclavian arteries are widely patent. RIGHT CAROTID SYSTEM: Normal without aneurysm, dissection or stenosis. LEFT CAROTID SYSTEM: Normal without aneurysm, dissection or stenosis. VERTEBRAL ARTERIES: Codominant configuration. Both origins are clearly patent. There is no dissection, occlusion or flow-limiting stenosis to the skull base (V1-V3 segments). CTA HEAD FINDINGS POSTERIOR CIRCULATION: --Vertebral arteries: Normal V4 segments. --Posterior inferior cerebellar arteries (PICA): Patent origins from the vertebral arteries. --Anterior inferior cerebellar arteries (AICA): Patent origins from the basilar artery. --Basilar artery: Normal. --Superior cerebellar arteries: Normal. --Posterior cerebral arteries: Normal. Both originate from the basilar artery. Posterior communicating arteries (p-comm) are diminutive or absent. ANTERIOR CIRCULATION: --Intracranial internal carotid arteries: Normal. --Anterior cerebral arteries (ACA): Normal. Both A1 segments are present. Patent anterior communicating artery (a-comm). --Middle cerebral arteries (MCA): Normal. VENOUS SINUSES: As  permitted by contrast timing, patent. ANATOMIC VARIANTS: None Review of the MIP images confirms the above findings. IMPRESSION: 1. No emergent large vessel occlusion or high-grade stenosis. 2. 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 Electronically Signed   By: Deatra Robinson M.D.   On: 07/04/2019 21:25   DG Chest Portable 1 View  Result Date: 07/04/2019 CLINICAL DATA:  Chest pain EXAM: PORTABLE CHEST 1 VIEW COMPARISON:  10/04/2018, 12/13/2017 FINDINGS: Cardiomegaly. Mild diffuse coarse chronic interstitial opacity. No acute focal airspace disease or effusion. Aortic atherosclerosis. No pneumothorax. IMPRESSION: Cardiomegaly. No edema or infiltrate. Mild diffuse coarse chronic interstitial opacity Electronically Signed   By: Jasmine Pang M.D.   On: 07/04/2019 18:19   CT VENOGRAM HEAD  Result Date: 07/04/2019 CLINICAL DATA:  Syncope and weakness EXAM: CT VENOGRAM HEAD TECHNIQUE: Vena graphic images of the head were obtained following the administration of intravenous contrast material. Multiplanar reformats and maximum intensity projections were constructed. CONTRAST:  72mL OMNIPAQUE IOHEXOL 350 MG/ML SOLN COMPARISON:  CTA head neck same day FINDINGS: Superior sagittal sinus: Normal. Straight sinus: Normal. Inferior sagittal sinus, vein of [place] and internal cerebral veins: Normal. Transverse sinuses: Normal. Sigmoid sinuses: Normal. Visualized jugular veins: Normal. IMPRESSION: Normal head CT venogram. Electronically Signed   By: Deatra Robinson M.D.   On: 07/04/2019 21:27     Assessment/Plan: Patient presenting with headache, falls, inability to focus his eyes with divergent gaze, high blood glucose, He has some form of a CNS syndrome.   CTA sinus venous thrombosis has been ruled out.  MRI done  today without contrast because of patient not cooperating shows Cortical diffusion-weighted signal abnormality and corresponding T2/FLAIR hyperintensity involving much of the right parietal lobe and  extending inferiorly to involve portions of the right occipital lobe and posteromedial right temporal lobe. There are a few additional punctate foci of restricted diffusion within the high right parietal lobe. Findings are suspicious for seizure related changes and/or cerebritis. Acute ischemia cannot be excluded, particularly for the punctate foci within the high right parietal lobe.  EEG shows rt tempo parietal focus   So the big question is why does this patient have  seizure and what is the etiology?  IS it local cerebritis ?- ? 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 Is this  a toxidrome  He will need LP He will need continuous EEG Video monitoring  Will need transfer to [facility]  For now until things are more clear we will continue with acyclovir, ampicillin .  will stop ceftriaxone  Chronic rt great toe wound Does not look overtly infected Will get a culture  Discussed the management with Hospitalist and heme onc

## 2019-07-06 NOTE — Progress Notes (Signed)
Patient continues to be confused but following simple commands.  Notified Dr. Luberta Robertson about right foot wound that appeared to infected and hypothermia.  Room temperature increased and warm blankets applied.  Order received for wound consult to evaluate right foot.

## 2019-07-06 NOTE — Progress Notes (Signed)
Dr. Montez Morita called back and advised to give ativan when staring episodes happen and patient may be having seizure based of EEG and MRI results.

## 2019-07-06 NOTE — Consult Note (Signed)
Hematology/Oncology Consult note [facility] [phone] [phone]  Patient Care Team: System, Pcp Not In as PCP - General   Name of the patient: John Haas  [id number]  01/12/1966    Reason for consult: Acute encephalopathy with underlying history of sickle cell beta thalassemia   Requesting physician: Dr. Luberta Robertsonhatterjee  Date of visit: 07/06/2019  History of presenting illness-patient is a 54 year old African-American male with history of sickle cell beta thalassemia.  Last hemoglobin electrophoresis from April 2016 showed hemoglobin S 68%, hemoglobin A2 5.1% and hemoglobin 825.1%.  At baseline his hemoglobin S typically ranges between 60 to 70%.  He follows up with Dr. Hulan Frayhen [facility] for his sickle cell chronic pain syndrome.  He has been following up with them for a long time.  I am unable to access care everywhere records [facility] as release of information has not yet been signed.  [facility] even in February 2021 for sickle cell pain crisis.  At baseline patient's hemoglobin is typically between 9-10.  Patient also has chronically elevated white cell count between 13-16.  Platelet counts have been normal in the past.  He also has some element of chronic kidney disease with a baseline creatinine of around 1.3.  Patient is currently admitted to the hospital with altered mental status.  He was brought by EMS yesterday after he was found on the floor and unable for him yelling.  On admission patient was found to be somnolent and belligerent at the same time.  He is currently being treated with IV antibiotics including ampicillin and Rocephin vancomycin and acyclovir for possible meningitis and infectious diseases on board.  Chest x-ray did not show any acute disease.  CT head without contrast did not show any acute intracranial abnormalities.  CT venogram head did not reveal any evidence of dural venous sinus thrombosis.  CT  angio head and neck did not reveal any large vessel occlusion or high-grade stenosis.  MRI brain is currently pending.  Hemoglobin electrophoresis and EEG has also been ordered and is currently pending  At the time of my visit today patient was muttering to himself but reports that he was in pain.  He could tell me that he was at [facility] and that his family includes his sister and his nephew's.     Review of systems- Review of Systems  Unable to perform ROS: Mental status change    Allergies  Allergen Reactions  . Ketamine Hives  . Nubain [Nalbuphine Hcl] Hives  . Stadol [Butorphanol] Hives    Patient Active Problem List   Diagnosis Date Noted  . Sickle thalassemia disease (HCC) 07/05/2019  . Partial seizure (HCC) 07/05/2019  . Headache 07/05/2019  . COPD with acute bronchitis (HCC) 07/05/2019  . Type 2 diabetes mellitus without complication (HCC) 07/05/2019  . Suspect Meningitis 07/05/2019  . Acute confusion 07/05/2019  . Recurrent seizures (HCC) 07/05/2019     Past Medical History:  Diagnosis Date  . COPD (chronic obstructive pulmonary disease) (HCC)   . Diabetes mellitus without complication (HCC)   . Heart attack (HCC)   . Hypertension   . 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 Sickle cell anemia (HCC)      Past Surgical History:  Procedure Laterality Date  . ANKLE SURGERY    . KNEE SURGERY    . PORT-A-CATH REMOVAL      Social History  Socioeconomic History  . Marital status: Single    Spouse name: Not on file  . Number of children: Not on file  . Years of education: Not on file  . Highest education level: Not on file  Occupational History  . Not on file  Tobacco Use  . Smoking status: Current Every Day Smoker  . Smokeless tobacco: Never Used  Substance and Sexual Activity  . Alcohol use: No  . Drug use: No  . Sexual activity: Not on file  Other Topics Concern  . Not on file  Social History  Narrative  . Not on file   Social Determinants of Health   Financial Resource Strain:   . Difficulty of Paying Living Expenses:   Food Insecurity:   . Worried About Programme researcher, broadcasting/film/video in the Last Year:   . Barista in the Last Year:   Transportation Needs:   . Freight forwarder (Medical):   Marland Kitchen Lack of Transportation (Non-Medical):   Physical Activity:   . Days of Exercise per Week:   . Minutes of Exercise per Session:   Stress:   . Feeling of Stress :   Social Connections:   . Frequency of Communication with Friends and Family:   . Frequency of Social Gatherings with Friends and Family:   . Attends Religious Services:   . Active Member of Clubs or Organizations:   . Attends Banker Meetings:   Marland Kitchen Marital Status:   Intimate Partner Violence:   . Fear of Current or Ex-Partner:   . Emotionally Abused:   Marland Kitchen Physically Abused:   . Sexually Abused:      No family history on file.   Current Facility-Administered Medications:  .  0.9 %  sodium chloride infusion, , Intravenous, Continuous, Lindajo Royal V, MD, Last Rate: 125 mL/hr at 07/06/19 1000, Rate Verify at 07/06/19 1000 .  acetaminophen (TYLENOL) tablet 650 mg, 650 mg, Oral, Q6H PRN, 650 mg at 07/05/19 1940 **OR** acetaminophen (TYLENOL) suppository 650 mg, 650 mg, Rectal, Q6H PRN, Andris Baumann, MD .  acyclovir (ZOVIRAX) 920 mg in dextrose 5 % 150 mL IVPB, 920 mg, Intravenous, Q8H, Hall, Scott A, RPH, Last Rate: 168.4 mL/hr at 07/06/19 1038, 920 mg at 07/06/19 1038 .  ampicillin (OMNIPEN) 2 g in sodium chloride 0.9 % 100 mL IVPB, 2 g, Intravenous, Q4H, Andris Baumann, MD, Stopped at 07/06/19 [phone] .  buprenorphine-naloxone (SUBOXONE) 8-2 mg per SL tablet 3 tablet, 3 tablet, Sublingual, Daily, Luberta Robertson Raynaldo Opitz, MD, 3 tablet at 07/05/19 1342 .  cefTRIAXone (ROCEPHIN) 2 g in sodium chloride 0.9 % 100 mL IVPB, 2 g, Intravenous, Q12H, Andris Baumann, MD, Last Rate: 200 mL/hr at 07/06/19 1000, Rate  Verify at 07/06/19 1000 .  Chlorhexidine Gluconate Cloth 2 % PADS 6 each, 6 each, Topical, Daily, Andris Baumann, MD, 6 each at 07/06/19 1036 .  dexamethasone (DECADRON) injection 10 mg, 10 mg, Intravenous, Q6H, Andris Baumann, MD, 10 mg at 07/06/19 0759 .  insulin aspart (novoLOG) injection 0-15 Units, 0-15 Units, Subcutaneous, TID WC, Andris Baumann, MD, 8 Units at 07/06/19 0758 .  insulin aspart (novoLOG) injection 0-5 Units, 0-5 Units, Subcutaneous, QHS, Andris Baumann, MD, 2 Units at 07/05/19 2142 .  insulin detemir (LEVEMIR) injection 25 Units, 25 Units, Subcutaneous, BID, Chatterjee, Horatio Pel Tublu, MD .  levETIRAcetam (KEPPRA) IVPB 500 mg/100 mL premix, 500 mg, Intravenous, Q12H, Andris Baumann, MD, Stopped at 07/06/19 0815 .  lidocaine (  LIDODERM) 5 % 1 patch, 1 patch, Transdermal, Q24H, Chatterjee, Srobona Tublu, MD .  LORazepam (ATIVAN) injection 1-2 mg, 1-2 mg, Intravenous, Q2H PRN, Jimmye Norman, NP, 2 mg at 07/06/19 0759 .  MEDLINE mouth rinse, 15 mL, Mouth Rinse, BID, Manuela Schwartz, NP, 15 mL at 07/06/19 1039 .  nicotine (NICODERM CQ - dosed in mg/24 hours) patch 21 mg, 21 mg, Transdermal, Daily, Leandro Reasoner Tublu, MD, 21 mg at 07/05/19 1457 .  ondansetron (ZOFRAN) tablet 4 mg, 4 mg, Oral, Q6H PRN **OR** ondansetron (ZOFRAN) injection 4 mg, 4 mg, Intravenous, Q6H PRN, Andris Baumann, MD, 4 mg at 07/06/19 0300 .  pantoprazole (PROTONIX) EC tablet 40 mg, 40 mg, Oral, Daily, Leandro Reasoner Tublu, MD, 40 mg at 07/05/19 1457 .  tamsulosin (FLOMAX) capsule 0.4 mg, 0.4 mg, Oral, Daily, Leandro Reasoner Tublu, MD, 0.4 mg at 07/05/19 1457   Physical exam:  Vitals:   07/06/19 0900 07/06/19 1000 07/06/19 1045 07/06/19 1100  BP: 135/88 (!) 150/104  116/78  Pulse:      Resp: 17 13  12   Temp:   (!) 95.4 F (35.2 C)   TempSrc:   Oral   SpO2: 94% 94%  95%  Weight:      Height:       Physical Exam Constitutional:      Comments: Eyes closed and patient  muttering to himself.  He is not overtly combative or aggressive.  Moving all 4 extremities  Cardiovascular:     Rate and Rhythm: Regular rhythm. Tachycardia present.  Pulmonary:     Effort: Pulmonary effort is normal.  Abdominal:     General: Bowel sounds are normal.     Palpations: Abdomen is soft.  Musculoskeletal:     Cervical back: Normal range of motion.     Comments: Chronic diabetic foot ulcer noted in his right foot with dressing in place  Skin:    General: Skin is warm and dry.        CMP Latest Ref Rng & Units 07/05/2019  Glucose 70 - 99 mg/dL 07/07/2019)  BUN 6 - 20 mg/dL 19  Creatinine 947(S - 9.62 mg/dL 8.36  Sodium 6.29 - 476 mmol/L 135  Potassium 3.5 - 5.1 mmol/L 5.1  Chloride 98 - 111 mmol/L 105  CO2 22 - 32 mmol/L 20(L)  Calcium 8.9 - 10.3 mg/dL 546)  Total Protein 6.5 - 8.1 g/dL -  Total Bilirubin 0.3 - 1.2 mg/dL -  Alkaline Phos 38 - 5.0(P U/L -  AST 15 - 41 U/L -  ALT 0 - 44 U/L -   CBC Latest Ref Rng & Units 07/05/2019  WBC 4.0 - 10.5 K/uL 13.3(H)  Hemoglobin 13.0 - 17.0 g/dL 11.2(L)  Hematocrit 39.0 - 52.0 % 34.5(L)  Platelets 150 - 400 K/uL 307    @IMAGES @  CT Angio Head W or Wo Contrast  Result Date: 07/04/2019 CLINICAL DATA:  Weakness, syncope. EXAM: CT ANGIOGRAPHY HEAD AND NECK TECHNIQUE: Multidetector CT imaging of the head and neck was performed using the standard protocol during bolus administration of intravenous contrast. Multiplanar CT image reconstructions and MIPs were obtained to evaluate the vascular anatomy. Carotid stenosis measurements (when applicable) are obtained utilizing NASCET criteria, using the distal internal carotid diameter as the denominator. CONTRAST:  56mL OMNIPAQUE IOHEXOL 350 MG/ML SOLN COMPARISON:  None. FINDINGS: CT HEAD FINDINGS Brain: There is no mass, hemorrhage or extra-axial collection. The size and configuration of the ventricles and extra-axial CSF spaces are normal. Old bilateral  frontal lobe subcortical infarcts.  The brain parenchyma is normal. Skull: The visualized skull base, calvarium and extracranial soft tissues are normal. Sinuses/Orbits: No fluid levels or advanced mucosal thickening of the visualized paranasal sinuses. No mastoid or middle ear effusion. The orbits are normal. CTA NECK FINDINGS SKELETON: There is no bony spinal canal stenosis. No lytic or blastic lesion. OTHER NECK: Normal pharynx, larynx and major salivary glands. No cervical lymphadenopathy. Unremarkable thyroid gland. UPPER CHEST: No pneumothorax or pleural effusion. No nodules or masses. AORTIC ARCH: There is no calcific atherosclerosis of the aortic arch. There is no aneurysm, dissection or hemodynamically significant stenosis of the visualized portion of the aorta. Conventional 3 vessel aortic branching pattern. The visualized proximal subclavian arteries are widely patent. RIGHT CAROTID SYSTEM: Normal without aneurysm, dissection or stenosis. LEFT CAROTID SYSTEM: Normal without aneurysm, dissection or stenosis. VERTEBRAL ARTERIES: Codominant configuration. Both origins are clearly patent. There is no dissection, occlusion or flow-limiting stenosis to the skull base (V1-V3 segments). CTA HEAD FINDINGS POSTERIOR CIRCULATION: --Vertebral arteries: Normal V4 segments. --Posterior inferior cerebellar arteries (PICA): Patent origins from the vertebral arteries. --Anterior inferior cerebellar arteries (AICA): Patent origins from the basilar artery. --Basilar artery: Normal. --Superior cerebellar arteries: Normal. --Posterior cerebral arteries: Normal. Both originate from the basilar artery. Posterior communicating arteries (p-comm) are diminutive or absent. ANTERIOR CIRCULATION: --Intracranial internal carotid arteries: Normal. --Anterior cerebral arteries (ACA): Normal. Both A1 segments are present. Patent anterior communicating artery (a-comm). --Middle cerebral arteries (MCA): Normal. VENOUS SINUSES: As permitted by contrast timing, patent.  ANATOMIC VARIANTS: None Review of the MIP images confirms the above findings. IMPRESSION: 1. No emergent large vessel occlusion or high-grade stenosis. 2. 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 Electronically Signed   By: Ulyses Jarred M.D.   On: 07/04/2019 21:25   CT Head Wo Contrast  Result Date: 07/04/2019 CLINICAL DATA:  54 year old male with history of headache. Multiple falls. Seizures. EXAM: CT HEAD WITHOUT CONTRAST TECHNIQUE: Contiguous axial images were obtained from the base of the skull through the vertex without intravenous contrast. COMPARISON:  Head CT 07/03/2019. FINDINGS: Brain: Patchy and confluent areas of decreased attenuation are noted throughout the deep and periventricular white matter of the cerebral hemispheres bilaterally, compatible with chronic microvascular ischemic disease. No evidence of acute infarction, hemorrhage, hydrocephalus, extra-axial collection or mass lesion/mass effect. Vascular: No hyperdense vessel or unexpected calcification. Skull: Normal. Negative for fracture or focal lesion. Sinuses/Orbits: No acute finding. Other: None. IMPRESSION: 1. No acute intracranial abnormalities. 2. Chronic microvascular ischemic changes in the cerebral white matter, as above. Electronically Signed   By: Vinnie Langton M.D.   On: 07/04/2019 18:13   CT Head Wo Contrast  Result Date: 07/03/2019 CLINICAL DATA:  Recent fall with headaches EXAM: CT HEAD WITHOUT CONTRAST TECHNIQUE: Contiguous axial images were obtained from the base of the skull through the vertex without intravenous contrast. COMPARISON:  None. FINDINGS: Brain: Mild atrophic changes are noted. Areas of prior ischemia are seen in the frontal lobes bilaterally. No findings to suggest acute hemorrhage, acute infarction or space-occupying mass lesion are seen. Vascular: No hyperdense vessel or unexpected calcification. Skull: Normal. Negative for fracture or focal lesion. Sinuses/Orbits: No acute finding. Other:  None. IMPRESSION: Mild atrophic changes and areas of prior ischemia without acute abnormality. Electronically Signed   By: Inez Catalina M.D.   On: 07/03/2019 23:13   CT Angio Neck W and/or Wo Contrast  Result Date: 07/04/2019 CLINICAL DATA:  Weakness, syncope. EXAM: CT ANGIOGRAPHY HEAD AND NECK TECHNIQUE: Multidetector CT  imaging of the head and neck was performed using the standard protocol during bolus administration of intravenous contrast. Multiplanar CT image reconstructions and MIPs were obtained to evaluate the vascular anatomy. Carotid stenosis measurements (when applicable) are obtained utilizing NASCET criteria, using the distal internal carotid diameter as the denominator. CONTRAST:  92mL OMNIPAQUE IOHEXOL 350 MG/ML SOLN COMPARISON:  None. FINDINGS: CT HEAD FINDINGS Brain: There is no mass, hemorrhage or extra-axial collection. The size and configuration of the ventricles and extra-axial CSF spaces are normal. 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 The brain parenchyma is normal. Skull: The visualized skull base, calvarium and extracranial soft tissues are normal. Sinuses/Orbits: No fluid levels or advanced mucosal thickening of the visualized paranasal sinuses. No mastoid or middle ear effusion. The orbits are normal. CTA NECK FINDINGS SKELETON: There is no bony spinal canal stenosis. No lytic or blastic lesion. OTHER NECK: Normal pharynx, larynx and major salivary glands. No cervical lymphadenopathy. Unremarkable thyroid gland. UPPER CHEST: No pneumothorax or pleural effusion. No nodules or masses. AORTIC ARCH: There is no calcific atherosclerosis of the aortic arch. There is no aneurysm, dissection or hemodynamically significant stenosis of the visualized portion of the aorta. Conventional 3 vessel aortic branching pattern. The visualized proximal subclavian arteries are widely patent. RIGHT CAROTID SYSTEM: Normal without aneurysm, dissection or stenosis. LEFT CAROTID SYSTEM: Normal  without aneurysm, dissection or stenosis. VERTEBRAL ARTERIES: Codominant configuration. Both origins are clearly patent. There is no dissection, occlusion or flow-limiting stenosis to the skull base (V1-V3 segments). CTA HEAD FINDINGS POSTERIOR CIRCULATION: --Vertebral arteries: Normal V4 segments. --Posterior inferior cerebellar arteries (PICA): Patent origins from the vertebral arteries. --Anterior inferior cerebellar arteries (AICA): Patent origins from the basilar artery. --Basilar artery: Normal. --Superior cerebellar arteries: Normal. --Posterior cerebral arteries: Normal. Both originate from the basilar artery. Posterior communicating arteries (p-comm) are diminutive or absent. ANTERIOR CIRCULATION: --Intracranial internal carotid arteries: Normal. --Anterior cerebral arteries (ACA): Normal. Both A1 segments are present. Patent anterior communicating artery (a-comm). --Middle cerebral arteries (MCA): Normal. VENOUS SINUSES: As permitted by contrast timing, patent. ANATOMIC VARIANTS: None Review of the MIP images confirms the above findings. IMPRESSION: 1. No emergent large vessel occlusion or high-grade stenosis. 2. 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 Electronically Signed   By: Deatra Robinson M.D.   On: 07/04/2019 21:25   DG Chest Portable 1 View  Result Date: 07/04/2019 CLINICAL DATA:  Chest pain EXAM: PORTABLE CHEST 1 VIEW COMPARISON:  10/04/2018, 12/13/2017 FINDINGS: Cardiomegaly. Mild diffuse coarse chronic interstitial opacity. No acute focal airspace disease or effusion. Aortic atherosclerosis. No pneumothorax. IMPRESSION: Cardiomegaly. No edema or infiltrate. Mild diffuse coarse chronic interstitial opacity Electronically Signed   By: Jasmine Pang M.D.   On: 07/04/2019 18:19   CT VENOGRAM HEAD  Result Date: 07/04/2019 CLINICAL DATA:  Syncope and weakness EXAM: CT VENOGRAM HEAD TECHNIQUE: Vena graphic images of the head were obtained following the administration of intravenous  contrast material. Multiplanar reformats and maximum intensity projections were constructed. CONTRAST:  25mL OMNIPAQUE IOHEXOL 350 MG/ML SOLN COMPARISON:  CTA head neck same day FINDINGS: Superior sagittal sinus: Normal. Straight sinus: Normal. Inferior sagittal sinus, vein of Galen and internal cerebral veins: Normal. Transverse sinuses: Normal. Sigmoid sinuses: Normal. Visualized jugular veins: Normal. IMPRESSION: Normal head CT venogram. Electronically Signed   By: Deatra Robinson M.D.   On: 07/04/2019 21:27    Assessment and plan- Patient is a 54 y.o. male with history of sickle cell beta thalassemia admitted for acute encephalopathy of unclear etiology  1.  Acute  encephalopathy: patients with sickle cell disease especially those with sickle cell beta thalassemia do have an increased risk of ischemic or hemorrhagic stroke.  So far CT head as well as CT angio head and neck has not shown any evidence of ischemic or hemorrhagic stroke.  CT venogram also did not show any evidence of venous thrombosis.  Based on patient's blood work his hemoglobin is actually better than his baseline.  Typically his hemoglobin runs around 9 and is currently 11.  It is possible that partly this could be from hemoconcentration and would be okay for patient to get IV fluids.  Reticulocyte count is also not high. LDH is only mildly elevated at 214.  Total bilirubin mildly elevated at 1.5.  Overall his labs are not entirely conclusive for ongoing hemolysis.  It would be helpful to review outside [facility] records if patient would be able to sign release of information to access care everywhere.  No evidence of fever this admission. CXR showed no acute disease. UA negative.   ID is currently on board and patient is on empiric IV antibiotics for possible meningoencephalitis.  Neurology is already on board as well  At this point I would await MRI head to see if he has any evidence of ischemic or hemorrhagic stroke.  I will also await  hemoglobin electrophoresis testing results.  If MRI findings are suggestive of stroke then I will discuss his case with [facility] to see if a transfer would be indicated to initiate exchange transfusion to keep hemoglobin S levels less than 30%.  Currently with his hemoglobin remaining at baseline he does not require a blood transfusion at this time  I have spoken to Dr. Conard Novak who is patient's outpatient hematologist.  Patient has sickle cell beta plus thalassemia and not a beta 0 thalassemia.  Clinically patient has been doing well so far prior to his present hospitalization per Dr. Ellender Hose.  He has not had any recent episodes of acute chest syndrome or other thromboembolic episodes.  There has been some concern for opioid overdose in the recent past and he was therefore switched over from methadone to Suboxone. Dr. Ellender Hose tells me that he was doing well on Suboxone.  Patient's urine toxicology tests positive for opioids and I will touch base with our lab to see if it would be positive for Suboxone.  I suspect the positivity for opioids is not coming from Suboxone which is tested separately.  It is therefore unclear if patient is having an episode of opioid withdrawal if he has inadvertently taken any other form of opioid  At this time patient can be managed here in [place] and the 2 main indications for transfer would be:  1.  MRI findings indicative of a stroke which may necessitate the need for exchange transfusion 2.  Problem with pain control as ideally patient needs to be managed with Suboxone instead of IV opioids.  It would be in his best interest for this to be handled by the [facility] team at [facility] or [facility] main campus if pain remains uncontrolled.    >40 min spent in reviewing outside labs and discussing his care with DR. Elmsworth.  I have also attempted to get in touch with His sister Wylan Gentzler but I was unable to reach her     Visit Diagnosis 1. Chronic pain syndrome    2. New onset seizure (HCC)   3. Severe headache   4. Other elevated white blood cell (WBC) count   5.  Encephalopathy     Dr. Owens Shark, MD, MPH [facility] [phone] 07/06/2019  4:55 PM

## 2019-07-06 NOTE — Procedures (Signed)
ELECTROENCEPHALOGRAM REPORT   Patient: John Haas       Room #: IC13A-AA EEG No. ID: [id number] Age: 54 y.o.        Sex: male Requesting Physician: Luberta Robertson Report Date:  07/06/2019        Interpreting Physician: Thana Farr  History: [name] is an 54 y.o. male with new onset seizures  Medications:  Acyclovir, Ampicillin, Suboxone, Rocephin, Decadron, Insulin, Keppra, Flomax, Protonix  Conditions of Recording:  This is a 21 channel routine scalp EEG performed with bipolar and monopolar montages arranged in accordance to the international 10/20 system of electrode placement. One channel was dedicated to EKG recording.  The patient is in the poorly responsive state.  Description:  The background activity is slow and poorly organized.  This slow activity consists of a low voltage delta activity that is diffusely distributed.  There is superimposed beta activity noted as well.  The patient is positioned on his left.  Despite this there does appear some intermittent further slowing over the right temporoparietal region.   Frequent sharp waves are noted in this region as well.  The patient has periods when he further turns his head to the left and extensive artifact is noted.  Patient is able to respond to questioning at these times.  Patient also has periods of build up in beta activity over the right hemisphere without change clinically.  Hyperventilation and intermittent photic stimulation were not performed.  IMPRESSION: This is an abnormal electroencephalogram secondary to general background slowing with focal slowing and sharp waves intermittently noted in the right temporoparietal region.  This is consistent with the patient's history of focal left sided seizures.  Patient also with episodes of fast beta activity-some with head turning but responsiveness and some with no change in clinical activity.  Difficult clear characterization but can not rule out focal seizure activity.      Thana Farr, MD Neurology [phone] 07/06/2019, 4:04 PM

## 2019-07-06 NOTE — Consult Note (Addendum)
WOC Nurse Consult Note: Reason for Consult: Consult requested for right foot wound.  Performed remotely after review of progress notes and photo in the EMR.  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  Dressing procedure/placement/frequency: Please consider X-ray or MRI to R/O osteomyelitis if infection is a concern. Topical treatment orders provided for bedside nurses to perform daily as follows to absorb drainage and provide antimicrobial benefits: Cut piece of fo Aquacel [place] # [phone]) and tuck into right foot wound Q day, using swab to fill.  Moisten with NS to remove previous dressing. Cover with foam dressing (Change foam dressing Q 3 days or PRN soiling.) Please re-consult if further assistance is needed.  Thank-you,  Cammie Mcgee MSN, RN, CWOCN, [place], CNS [phone]

## 2019-07-06 NOTE — Progress Notes (Signed)
eeg completed ° °

## 2019-07-07 ENCOUNTER — Inpatient Hospital Stay: Payer: Medicare Other

## 2019-07-07 DIAGNOSIS — G9340 Encephalopathy, unspecified: Secondary | ICD-10-CM

## 2019-07-07 DIAGNOSIS — G039 Meningitis, unspecified: Secondary | ICD-10-CM | POA: Diagnosis not present

## 2019-07-07 DIAGNOSIS — R569 Unspecified convulsions: Secondary | ICD-10-CM

## 2019-07-07 DIAGNOSIS — G0490 Encephalitis and encephalomyelitis, unspecified: Secondary | ICD-10-CM

## 2019-07-07 LAB — HGB FRAC BY HPLC+SOLUBILITY
Hgb A: 13.5 % — ABNORMAL LOW (ref 96.4–98.8)
Hgb C: 0 %
Hgb E: 0 %
Hgb F: 0.9 % (ref 0.0–2.0)
Hgb S: 70.4 % — ABNORMAL HIGH
Hgb Solubility: POSITIVE — AB
Hgb Variant: 11 % — ABNORMAL HIGH

## 2019-07-07 LAB — BLOOD GAS, ARTERIAL
Acid-base deficit: 1.5 mmol/L (ref 0.0–2.0)
Bicarbonate: 24.3 mmol/L (ref 20.0–28.0)
FIO2: 0.4
MECHVT: 500 mL
O2 Saturation: 98.5 %
PEEP: 5 cmH2O
Patient temperature: 37
RATE: 16 resp/min
pCO2 arterial: 44 mmHg (ref 32.0–48.0)
pH, Arterial: 7.35 (ref 7.350–7.450)
pO2, Arterial: 119 mmHg — ABNORMAL HIGH (ref 83.0–108.0)

## 2019-07-07 LAB — CSF CELL COUNT WITH DIFFERENTIAL
Eosinophils, CSF: 0 %
Eosinophils, CSF: 0 %
Lymphs, CSF: 10 %
Lymphs, CSF: 25 %
Monocyte-Macrophage-Spinal Fluid: 35 %
Monocyte-Macrophage-Spinal Fluid: 40 %
RBC Count, CSF: 323 /mm3 — ABNORMAL HIGH (ref 0–3)
RBC Count, CSF: 555 /mm3 — ABNORMAL HIGH (ref 0–3)
Segmented Neutrophils-CSF: 40 %
Segmented Neutrophils-CSF: 50 %
Tube #: 1
Tube #: 3
WBC, CSF: 7 /mm3 — ABNORMAL HIGH (ref 0–5)
WBC, CSF: 9 /mm3 — ABNORMAL HIGH (ref 0–5)

## 2019-07-07 LAB — CBC
HCT: 32.5 % — ABNORMAL LOW (ref 39.0–52.0)
Hemoglobin: 10.7 g/dL — ABNORMAL LOW (ref 13.0–17.0)
MCH: 21.8 pg — ABNORMAL LOW (ref 26.0–34.0)
MCHC: 32.9 g/dL (ref 30.0–36.0)
MCV: 66.2 fL — ABNORMAL LOW (ref 80.0–100.0)
Platelets: 283 10*3/uL (ref 150–400)
RBC: 4.91 MIL/uL (ref 4.22–5.81)
RDW: 16.2 % — ABNORMAL HIGH (ref 11.5–15.5)
WBC: 19 10*3/uL — ABNORMAL HIGH (ref 4.0–10.5)
nRBC: 2.9 % — ABNORMAL HIGH (ref 0.0–0.2)

## 2019-07-07 LAB — PROTIME-INR
INR: 1.1 (ref 0.8–1.2)
Prothrombin Time: 13.7 seconds (ref 11.4–15.2)

## 2019-07-07 LAB — RETICULOCYTES
Immature Retic Fract: 30.4 % — ABNORMAL HIGH (ref 2.3–15.9)
RBC.: 5 MIL/uL (ref 4.22–5.81)
Retic Count, Absolute: 146.5 10*3/uL (ref 19.0–186.0)
Retic Ct Pct: 2.9 % (ref 0.4–3.1)

## 2019-07-07 LAB — PROTEIN, CSF: Total  Protein, CSF: 131 mg/dL — ABNORMAL HIGH (ref 15–45)

## 2019-07-07 LAB — TRIGLYCERIDES: Triglycerides: 88 mg/dL (ref <150)

## 2019-07-07 LAB — HAPTOGLOBIN: Haptoglobin: 173 mg/dL (ref 29–370)

## 2019-07-07 LAB — COMPREHENSIVE METABOLIC PANEL
ALT: 15 U/L (ref 0–44)
AST: 12 U/L — ABNORMAL LOW (ref 15–41)
Albumin: 2.9 g/dL — ABNORMAL LOW (ref 3.5–5.0)
Alkaline Phosphatase: 80 U/L (ref 38–126)
Anion gap: 8 (ref 5–15)
BUN: 21 mg/dL — ABNORMAL HIGH (ref 6–20)
CO2: 23 mmol/L (ref 22–32)
Calcium: 8.5 mg/dL — ABNORMAL LOW (ref 8.9–10.3)
Chloride: 114 mmol/L — ABNORMAL HIGH (ref 98–111)
Creatinine, Ser: 1.02 mg/dL (ref 0.61–1.24)
GFR calc Af Amer: >60 mL/min (ref >60)
GFR calc non Af Amer: >60 mL/min (ref >60)
Glucose, Bld: 297 mg/dL — ABNORMAL HIGH (ref 70–99)
Potassium: 4.2 mmol/L (ref 3.5–5.1)
Sodium: 145 mmol/L (ref 135–145)
Total Bilirubin: 1.2 mg/dL (ref 0.3–1.2)
Total Protein: 6.6 g/dL (ref 6.5–8.1)

## 2019-07-07 LAB — CRYPTOCOCCAL ANTIGEN, CSF: Crypto Ag: NEGATIVE

## 2019-07-07 LAB — GLUCOSE, CAPILLARY
Glucose-Capillary: 295 mg/dL — ABNORMAL HIGH (ref 70–99)
Glucose-Capillary: 244 mg/dL — ABNORMAL HIGH (ref 70–99)
Glucose-Capillary: 288 mg/dL — ABNORMAL HIGH (ref 70–99)

## 2019-07-07 LAB — GLUCOSE, CSF: Glucose, CSF: 188 mg/dL — ABNORMAL HIGH (ref 40–70)

## 2019-07-07 LAB — HGB FRACTIONATION CASCADE: Hgb A2: 4.2 % — ABNORMAL HIGH (ref 1.8–3.2)

## 2019-07-07 LAB — APTT: aPTT: 26 seconds (ref 24–36)

## 2019-07-07 LAB — PHENYTOIN LEVEL, TOTAL: Phenytoin Lvl: 7.9 ug/mL — ABNORMAL LOW (ref 10.0–20.0)

## 2019-07-07 MED ORDER — FENTANYL CITRATE (PF) 100 MCG/2ML IJ SOLN: 200.0000 ug | Freq: Once | INTRAMUSCULAR | Status: AC

## 2019-07-07 MED ORDER — PROPOFOL 1000 MG/100ML IV EMUL
5.0000 ug/kg/min | INTRAVENOUS | Status: DC
  Administered 2019-07-07: 60 ug/kg/min via INTRAVENOUS
  Administered 2019-07-08: 30 ug/kg/min via INTRAVENOUS
  Administered 2019-07-08: 60 ug/kg/min via INTRAVENOUS
  Administered 2019-07-08 (×3): 50 ug/kg/min via INTRAVENOUS
  Administered 2019-07-08: 30 ug/kg/min via INTRAVENOUS
  Administered 2019-07-08: 50 ug/kg/min via INTRAVENOUS
  Administered 2019-07-09: 35 ug/kg/min via INTRAVENOUS
  Administered 2019-07-09: 49.984 ug/kg/min via INTRAVENOUS
  Administered 2019-07-09 (×2): 50 ug/kg/min via INTRAVENOUS
  Administered 2019-07-09: 10 ug/kg/min via INTRAVENOUS
  Administered 2019-07-09: 40 ug/kg/min via INTRAVENOUS
  Administered 2019-07-10: 35 ug/kg/min via INTRAVENOUS
  Filled 2019-07-07 (×18): qty 100

## 2019-07-07 MED ORDER — INSULIN DETEMIR 100 UNIT/ML ~~LOC~~ SOLN
30.0000 [IU] | Freq: Two times a day (BID) | SUBCUTANEOUS | Status: DC
  Filled 2019-07-07: qty 0.3

## 2019-07-07 MED ORDER — SODIUM CHLORIDE 0.9 % IV SOLN
150.0000 mg | Freq: Three times a day (TID) | INTRAVENOUS | Status: DC
  Administered 2019-07-07 – 2019-07-13 (×17): 150 mg via INTRAVENOUS
  Filled 2019-07-07 (×24): qty 3

## 2019-07-07 MED ORDER — MIDAZOLAM HCL 2 MG/2ML IJ SOLN
2.0000 mg | INTRAMUSCULAR | Status: DC | PRN
  Administered 2019-07-08: 2 mg via INTRAVENOUS
  Filled 2019-07-07 (×3): qty 2

## 2019-07-07 MED ORDER — MIDAZOLAM HCL 2 MG/2ML IJ SOLN
INTRAMUSCULAR | Status: AC
  Administered 2019-07-07: 4 mg via INTRAVENOUS
  Filled 2019-07-07: qty 4

## 2019-07-07 MED ORDER — ORAL CARE MOUTH RINSE
15.0000 mL | OROMUCOSAL | Status: DC
  Administered 2019-07-07 – 2019-07-13 (×45): 15 mL via OROMUCOSAL

## 2019-07-07 MED ORDER — FENTANYL 2500MCG IN NS 250ML (10MCG/ML) PREMIX INFUSION
0.0000 ug/h | INTRAVENOUS | Status: DC
  Administered 2019-07-07: 25 ug/h via INTRAVENOUS
  Administered 2019-07-08: 250 ug/h via INTRAVENOUS
  Administered 2019-07-08: 300 ug/h via INTRAVENOUS
  Administered 2019-07-08: 200 ug/h via INTRAVENOUS
  Administered 2019-07-09: 300 ug/h via INTRAVENOUS
  Filled 2019-07-07 (×6): qty 250

## 2019-07-07 MED ORDER — ROCURONIUM BROMIDE 50 MG/5ML IV SOLN: 50.0000 mg | Freq: Once | INTRAVENOUS | Status: AC

## 2019-07-07 MED ORDER — ROCURONIUM BROMIDE 50 MG/5ML IV SOLN
INTRAVENOUS | Status: AC
  Administered 2019-07-07: 50 mg via INTRAVENOUS
  Filled 2019-07-07: qty 1

## 2019-07-07 MED ORDER — MIDAZOLAM HCL 2 MG/2ML IJ SOLN
2.0000 mg | Freq: Once | INTRAMUSCULAR | Status: AC
  Filled 2019-07-07: qty 2

## 2019-07-07 MED ORDER — MIDAZOLAM HCL 2 MG/2ML IJ SOLN: 4.0000 mg | Freq: Once | INTRAMUSCULAR | Status: AC

## 2019-07-07 MED ORDER — PROPOFOL 1000 MG/100ML IV EMUL
INTRAVENOUS | Status: AC
  Administered 2019-07-07: 13:00:00 20 ug/kg/min via INTRAVENOUS
  Filled 2019-07-07: qty 100

## 2019-07-07 MED ORDER — CHLORHEXIDINE GLUCONATE 0.12% ORAL RINSE (MEDLINE KIT)
15.0000 mL | Freq: Two times a day (BID) | OROMUCOSAL | Status: DC
  Administered 2019-07-07 – 2019-07-13 (×11): 15 mL via OROMUCOSAL

## 2019-07-07 MED ORDER — LACTATED RINGERS IV SOLN: INTRAVENOUS | Status: DC

## 2019-07-07 MED ORDER — SODIUM CHLORIDE 0.9 % IV SOLN
2.0000 g | Freq: Two times a day (BID) | INTRAVENOUS | Status: DC
  Administered 2019-07-08 – 2019-07-12 (×10): 2 g via INTRAVENOUS
  Filled 2019-07-07 (×4): qty 2
  Filled 2019-07-07: qty 20
  Filled 2019-07-07 (×4): qty 2
  Filled 2019-07-07: qty 20
  Filled 2019-07-07: qty 2
  Filled 2019-07-07: qty 20
  Filled 2019-07-07: qty 2

## 2019-07-07 MED ORDER — ZIPRASIDONE MESYLATE 20 MG IM SOLR: 20.0000 mg | INTRAMUSCULAR | Status: DC | PRN

## 2019-07-07 MED ORDER — MIDAZOLAM HCL 2 MG/2ML IJ SOLN
2.0000 mg | INTRAMUSCULAR | Status: DC | PRN
  Administered 2019-07-08 (×2): 2 mg via INTRAVENOUS
  Filled 2019-07-07: qty 2

## 2019-07-07 MED ORDER — INSULIN ASPART 100 UNIT/ML ~~LOC~~ SOLN
3.0000 [IU] | SUBCUTANEOUS | Status: DC
  Filled 2019-07-07: qty 1

## 2019-07-07 MED ORDER — MIDAZOLAM HCL 2 MG/2ML IJ SOLN
2.0000 mg | Freq: Once | INTRAMUSCULAR | Status: AC
  Administered 2019-07-07: 2 mg via INTRAVENOUS

## 2019-07-07 MED ORDER — DEXMEDETOMIDINE BOLUS VIA INFUSION
1.0000 ug/kg | Freq: Once | INTRAVENOUS | Status: DC
  Filled 2019-07-07: qty 103

## 2019-07-07 MED ORDER — INSULIN DETEMIR 100 UNIT/ML ~~LOC~~ SOLN: 35.0000 [IU] | Freq: Two times a day (BID) | SUBCUTANEOUS | Status: DC

## 2019-07-07 MED ORDER — LORAZEPAM 2 MG/ML IJ SOLN
2.0000 mg | INTRAMUSCULAR | Status: DC | PRN
  Administered 2019-07-07: 2 mg via INTRAVENOUS

## 2019-07-07 MED ORDER — INSULIN ASPART 100 UNIT/ML ~~LOC~~ SOLN
0.0000 [IU] | SUBCUTANEOUS | Status: DC
  Administered 2019-07-07: 11 [IU] via SUBCUTANEOUS
  Administered 2019-07-08: 3 [IU] via SUBCUTANEOUS
  Administered 2019-07-08: 4 [IU] via SUBCUTANEOUS
  Administered 2019-07-08: 3 [IU] via SUBCUTANEOUS
  Administered 2019-07-08: 7 [IU] via SUBCUTANEOUS
  Administered 2019-07-08 – 2019-07-09 (×2): 4 [IU] via SUBCUTANEOUS
  Administered 2019-07-09: 3 [IU] via SUBCUTANEOUS
  Administered 2019-07-09 (×2): 7 [IU] via SUBCUTANEOUS
  Administered 2019-07-09: 3 [IU] via SUBCUTANEOUS
  Administered 2019-07-09 – 2019-07-10 (×3): 4 [IU] via SUBCUTANEOUS
  Administered 2019-07-10: 7 [IU] via SUBCUTANEOUS
  Administered 2019-07-10: 4 [IU] via SUBCUTANEOUS
  Administered 2019-07-11: 7 [IU] via SUBCUTANEOUS
  Administered 2019-07-11 (×3): 4 [IU] via SUBCUTANEOUS
  Administered 2019-07-11 – 2019-07-12 (×2): 3 [IU] via SUBCUTANEOUS
  Administered 2019-07-12: 20:00:00 7 [IU] via SUBCUTANEOUS
  Administered 2019-07-12 (×2): 4 [IU] via SUBCUTANEOUS
  Administered 2019-07-12 (×3): 3 [IU] via SUBCUTANEOUS
  Administered 2019-07-13 (×3): 4 [IU] via SUBCUTANEOUS
  Filled 2019-07-07 (×30): qty 1

## 2019-07-07 MED ORDER — DEXMEDETOMIDINE HCL IN NACL 400 MCG/100ML IV SOLN: 0.2000 ug/kg/h | INTRAVENOUS | Status: DC

## 2019-07-07 MED ORDER — FENTANYL CITRATE (PF) 100 MCG/2ML IJ SOLN
INTRAMUSCULAR | Status: AC
  Administered 2019-07-07: 200 ug via INTRAVENOUS
  Filled 2019-07-07: qty 4

## 2019-07-07 MED ORDER — PANTOPRAZOLE SODIUM 40 MG PO PACK
40.0000 mg | PACK | Freq: Every day | ORAL | Status: DC
  Administered 2019-07-08 – 2019-07-10 (×3): 40 mg
  Filled 2019-07-07 (×3): qty 20

## 2019-07-07 MED ORDER — ZIPRASIDONE MESYLATE 20 MG IM SOLR
10.0000 mg | Freq: Once | INTRAMUSCULAR | Status: AC
  Administered 2019-07-07: 10 mg via INTRAMUSCULAR

## 2019-07-07 MED ORDER — HYDROMORPHONE HCL 1 MG/ML IJ SOLN
1.0000 mg | INTRAMUSCULAR | Status: DC | PRN
  Administered 2019-07-07: 1 mg via INTRAVENOUS
  Filled 2019-07-07: qty 1

## 2019-07-07 MED ORDER — BELLADONNA ALKALOIDS-OPIUM 16.2-30 MG RE SUPP: 1.0000 | Freq: Four times a day (QID) | RECTAL | Status: DC | PRN

## 2019-07-07 MED ORDER — MIDAZOLAM HCL 2 MG/2ML IJ SOLN
INTRAMUSCULAR | Status: AC
  Administered 2019-07-07: 2 mg via INTRAVENOUS
  Filled 2019-07-07: qty 2

## 2019-07-07 MED ORDER — SENNOSIDES-DOCUSATE SODIUM 8.6-50 MG PO TABS
2.0000 | ORAL_TABLET | Freq: Two times a day (BID) | ORAL | Status: DC
  Administered 2019-07-08 – 2019-07-10 (×6): 2
  Filled 2019-07-07 (×6): qty 2

## 2019-07-07 NOTE — Progress Notes (Signed)
Portable EEG completed, results pending. Pt agitated, uncooperative at the start of EEG, calm after receiving meds.

## 2019-07-07 NOTE — Procedures (Signed)
Endotracheal Intubation: Patient required placement of an artificial airway secondary to Respiratory Failure  Consent: Emergent.   Hand washing performed prior to starting the procedure.   Medications administered for sedation prior to procedure:  Midazolam 4 mg IV,  Rocuronium 10 mg IV, Fentanyl 200 mcg IV.    A time out procedure was called and correct patient, name, & ID confirmed. Needed supplies and equipment were assembled and checked to include ETT, 10 ml syringe, Glidescope, Mac and Miller blades, suction, oxygen and bag mask valve, end tidal CO2 monitor.   Patient was positioned to align the mouth and pharynx to facilitate visualization of the glottis.   Heart rate, SpO2 and blood pressure was continuously monitored during the procedure. Pre-oxygenation was conducted prior to intubation and endotracheal tube was placed through the vocal cords into the trachea.     The artificial airway was placed under direct visualization via glidescope route using a 8.0 ETT on the first attempt.  ETT was secured at 25 cm mark.  Placement was confirmed by auscuitation of lungs with good breath sounds bilaterally and no stomach sounds.  Condensation was noted on endotracheal tube.   Pulse ox 98%.  CO2 detector in place with appropriate color change.   Complications: None .   ETT placed by Liborio Nixon RT with direct bedside supervision by [facility] Advocate MD.   Chest radiograph ordered and pending.   Comments: OGT placed via glidescope.    Ottie Glazier, M.D.  [facility] [place]

## 2019-07-07 NOTE — Progress Notes (Addendum)
    BRIEF OVERNIGHT PROGRESS REPORT   SUBJECTIVE: Patient had another episode of seizure like activity around 8 pm.  Prior shift patient had episodes of blank stares and urine incontinence.  OBJECTIVE: On arrival to the bedside, patient had another episode of brief blank stare, with a rightward conjugate eye deviation lasting less than 10 seconds followed by deep snoring.  He was afebrile with blood pressure 140/108 mm Hg and pulse rate 115 beats/min. There were no focal neurological deficits; following the episode he was very agitated and not cooperative with the rest of the exam.  ASSESSMENT/PLAN: 1. Seizures -Given recurrent episodes and abnormality noted on MRI of the brain, I discussed this case with [facility] neurologist Dr. Amada Jupiter who recommended loading patient with Dilantin and continue maintenance dose of Dilantin 100 mg TID in addition to Keppra. -EEG performed and showed general background slowing with focal slowing and sharp waves intermittently in the right temporoparietal region -Continues broad-spectrum abx with acyclovir, ampicillin and ceftriaxone for suspected CNS infection -Seizure precautions -Ativan prn seizure activity -ID and neurology following     Webb Silversmith, DNP, CCRN, FNP-C [facility]   [facility]

## 2019-07-07 NOTE — Progress Notes (Signed)
1600 BS not completed due to patient being off the floor at LP in Radiology.  MD aware will continue to monitor and assess.

## 2019-07-07 NOTE — Progress Notes (Signed)
Pt stable after lp.Back stable after l.p.

## 2019-07-07 NOTE — Progress Notes (Addendum)
CRITICAL CARE PROGRESS NOTE    Name: John Haas MRN: [id number] DOB: 1965/12/13     LOS: 3   SUBJECTIVE FINDINGS & SIGNIFICANT EVENTS   Patient description:   John Haas is a 54 y.o. male with medical history significant for sickle thalassemia, COPD, diabetes and hypertension who presented to the emergency room with a complaint of headache and aching all over.  He apparently went to [facility] ED yesterday but left without being seen.  In the ER tonight he became belligerent at times and at times appeared confused.  Also while in the emergency room he was noted to have intermittent shaking of his left arm and leg with deviation of his head to the left but he remained awake. Patient had an extensive work-up in the emergency room that was significant for white cell count of 22,500 up from his baseline of 17,000 and he had mild hyponatremia of 129.  Creatinine was 1.48 and blood sugar 348.  Flu and Covid test were negative.  He also had extensive imaging with CT venogram of the head, CT angio head and neck with and without contrast that showed no acute findings.  The emergency room provider spoke with hematology at [facility] who did not think the findings had to do with his sickle thalassemia.  He also spoke with neurology at [facility] health who recommended giving [place] for suspected partial seizures.  Patient received Keppra and Ativan in the emergency room.  He was also treated empirically for possible meningitis given his continued complaints of headache and slight worsening of his chronic leukocytosis.   Lines / Drains: Internal jugular central line, PIV x2  Cultures / Sepsis markers: Status post lumbar puncture, blood cultures  Antibiotics: Acyclovir, ampicillin, Rocephin,   Protocols / Consultants: PCCM, neurology,  hematology/oncology, infectious disease, hospitalist service   Events: 07/07/19-patient with recurrent partial seizure activity, evaluated at bedside with neurologist, patient is at high risk of trauma to tongue with possible bleeding/aspiration into airway, currently unable to protect airway, decision to intubate emergently.  Post intubation was able to speak with his sister and discuss care plan.  She is thankful for care, questions were answered sister is agreeable with care plan.  A lumbar puncture was able to be performed after endotracheal intubation.  Patient was placed on propofol and seizure activity has resolved.     PAST MEDICAL HISTORY   Past Medical History:  Diagnosis Date  . COPD (chronic obstructive pulmonary disease) ([place])   . Diabetes mellitus without complication ([place])   . Heart attack ([place])   . Hypertension   . Opioid dependence ([place])    long-term narcotics use for sickle cell associated pain  . Sickle cell anemia ([facility])      SURGICAL HISTORY   Past Surgical History:  Procedure Laterality Date  . ANKLE SURGERY    . KNEE SURGERY    . PORT-A-CATH REMOVAL       FAMILY HISTORY   No family history on file.   SOCIAL HISTORY   Social History   Tobacco Use  . Smoking status: Current Every Day Smoker  . Smokeless tobacco: Never Used  Substance Use Topics  . Alcohol use: No  . Drug use: No     MEDICATIONS   Current Medication:  Current Facility-Administered Medications:  .  0.45 % sodium chloride infusion, , Intravenous, Continuous, Swayze, Ava, DO, Last Rate: 75 mL/hr at 07/08/19 1021, New Bag at 07/08/19 1021 .  0.9 %  sodium chloride infusion (Manually program  via Guardrails IV Fluids), , Intravenous, Once, Swayze, Ava, DO .  acetaminophen (TYLENOL) tablet 650 mg, 650 mg, Oral, Q6H PRN, 650 mg at 07/05/19 1940 **OR** acetaminophen (TYLENOL) suppository 650 mg, 650 mg, Rectal, Q6H PRN, Athena Masse, MD .  acetaminophen (TYLENOL) tablet 650 mg,  650 mg, Oral, Once, Swayze, Ava, DO .  acyclovir (ZOVIRAX) 920 mg in dextrose 5 % 150 mL IVPB, 920 mg, Intravenous, Q8H, Hall, Scott A, RPH, Last Rate: 168.4 mL/hr at 07/08/19 0935, 920 mg at 07/08/19 0935 .  ampicillin (OMNIPEN) 2 g in sodium chloride 0.9 % 100 mL IVPB, 2 g, Intravenous, Q4H, Athena Masse, MD, Stopped at 07/08/19 [phone]  belladonna-opium (B&O) suppository 16.2-30 mg, 1 suppository, Rectal, Q6H PRN, Swayze, Ava, DO .  cefTRIAXone (ROCEPHIN) 2 g in sodium chloride 0.9 % 100 mL IVPB, 2 g, Intravenous, Q12H, Tsosie Billing, MD, Stopped at 07/08/19 0130 .  chlorhexidine gluconate (MEDLINE KIT) (PERIDEX) 0.12 % solution 15 mL, 15 mL, Mouth Rinse, BID, Lanney Gins, Chon Buhl, MD, 15 mL at 07/08/19 0812 .  Chlorhexidine Gluconate Cloth 2 % PADS 6 each, 6 each, Topical, Daily, Athena Masse, MD, 6 each at 07/06/19 1036 .  dexamethasone (DECADRON) injection 10 mg, 10 mg, Intravenous, Q6H, Athena Masse, MD, 10 mg at 07/08/19 0924 .  dexmedetomidine (PRECEDEX) 400 MCG/100ML (4 mcg/mL) infusion, 0.2-0.7 mcg/kg/hr, Intravenous, Continuous, Swayze, Ava, DO, Stopped at 07/07/19 1922 .  dexmedetomidine (PRECEDEX) bolus via infusion 102.7 mcg, 1 mcg/kg, Intravenous, Once, Swayze, Ava, DO, Stopped at 07/07/19 1923 .  diphenhydrAMINE (BENADRYL) injection 25 mg, 25 mg, Intravenous, Once, Swayze, Ava, DO .  fentaNYL 2555mg in NS 2540m(1027mml) infusion-PREMIX, 0-400 mcg/hr, Intravenous, Continuous, Roshawn Lacina, MD, Last Rate: 20 mL/hr at 07/08/19 0819, 200 mcg/hr at 07/08/19 0819 .  free water 100 mL, 100 mL, Per Tube, Q4H, KeeDarel Hong NP, 100 mL at 07/08/19 0812 .  HYDROmorphone (DILAUDID) injection 1 mg, 1 mg, Intravenous, Q2H PRN, Swayze, Ava, DO, 1 mg at 07/07/19 1215 .  insulin aspart (novoLOG) injection 0-20 Units, 0-20 Units, Subcutaneous, Q4H, KeeDarel Hong NP, 3 Units at 07/08/19 [phone] levETIRAcetam (KEPPRA) IVPB 1000 mg/100 mL premix, 1,000 mg, Intravenous, Q12H,  ReyAlexis GoodellD, Last Rate: 400 mL/hr at 07/08/19 0819, Rate Verify at 07/08/19 0819 .  MEDLINE mouth rinse, 15 mL, Mouth Rinse, 10 times per day, AleOttie GlazierD, 15 mL at 07/08/19 0925 .  midazolam (VERSED) injection 2 mg, 2 mg, Intravenous, Q15 min PRN, KeeDarel Hong NP, 2 mg at 07/08/19 0052 .  midazolam (VERSED) injection 2 mg, 2 mg, Intravenous, Q2H PRN, KeeDarel Hong NP, 2 mg at 07/08/19 0420 .  norepinephrine (LEVOPHED) 16 mg in 250m62memix infusion, 0-40 mcg/min, Intravenous, Titrated, KeenDarel HongNP, Last Rate: 3.75 mL/hr at 07/08/19 0819, 4 mcg/min at 07/08/19 0819 .  norepinephrine (LEVOPHED) 4-5 MG/250ML-% infusion SOLN, , , ,  .  ondansetron (ZOFRAN) tablet 4 mg, 4 mg, Oral, Q6H PRN **OR** ondansetron (ZOFRAN) injection 4 mg, 4 mg, Intravenous, Q6H PRN, DuncAthena Masse, 4 mg at 07/06/19 0300 .  pantoprazole sodium (PROTONIX) 40 mg/20 mL oral suspension 40 mg, 40 mg, Per Tube, Daily, AlesOttie Glazier, 40 mg at 07/08/19 0913 .  phenytoin (DILANTIN) 150 mg in sodium chloride 0.9 % 100 mL IVPB, 150 mg, Intravenous, Q8H, ReynAlexis Goodell, Stopped at 07/08/19 0557 .  propofol (DIPRIVAN) 1000 MG/100ML infusion, 5-80 mcg/kg/min, Intravenous, Titrated, AlesOttie Glazier, Last Rate:  18.49 mL/hr at 07/08/19 0923, 30 mcg/kg/min at 07/08/19 0923 .  senna-docusate (Senokot-S) tablet 2 tablet, 2 tablet, Per Tube, BID, Charlett Nose, RPH, 2 tablet at 07/08/19 0912 .  sodium chloride flush (NS) 0.9 % injection 10-40 mL, 10-40 mL, Intracatheter, PRN, Bonnell Public Tublu, MD .  sodium chloride flush (NS) 0.9 % injection 10-40 mL, 10-40 mL, Intracatheter, Q12H, Darel Hong D, NP, 10 mL at 07/08/19 0924 .  tamsulosin (FLOMAX) capsule 0.4 mg, 0.4 mg, Oral, Daily, Jamse Arn, Dewaine Oats Tublu, MD, 0.4 mg at 07/08/19 0912 .  ziprasidone (GEODON) injection 20 mg, 20 mg, Intramuscular, Q4H PRN, Swayze, Ava, DO    ALLERGIES   Ketamine, Nubain [nalbuphine  hcl], and Stadol [butorphanol]    REVIEW OF SYSTEMS     Unable to obtain due to recurrent seizure activity.  PHYSICAL EXAMINATION   Vital Signs: Temp:  [94.8 F (34.9 C)-99.5 F (37.5 C)] 99.5 F (37.5 C) (05/01 1015) Pulse Rate:  [72-106] 106 (05/01 1030) Resp:  [13-24] 17 (05/01 1030) BP: (84-130)/(53-82) 104/55 (05/01 1030) SpO2:  [96 %-100 %] 97 % (05/01 1030) FiO2 (%):  [40 %] 40 % (05/01 0803)  GENERAL: Appears younger than stated age HEAD: Normocephalic, atraumatic.  EYES: Pupils equal, round, reactive to light.  No scleral icterus.  MOUTH: Moist mucosal membrane. NECK: Supple. No thyromegaly. No nodules. No JVD.  PULMONARY: Mild crackles with decreased breath sounds bilaterally CARDIOVASCULAR: S1 and S2. Regular rate and rhythm. No murmurs, rubs, or gallops.  GASTROINTESTINAL: Soft, nontender, non-distended. No masses. Positive bowel sounds. No hepatosplenomegaly.  MUSCULOSKELETAL: No swelling, clubbing, or edema.  NEUROLOGIC: Mild distress due to acute illness SKIN:intact,warm,dry   PERTINENT DATA     Infusions: . sodium chloride 75 mL/hr at 07/08/19 1021  . acyclovir 920 mg (07/08/19 0935)  . ampicillin (OMNIPEN) IV Stopped (07/08/19 0556)  . cefTRIAXone (ROCEPHIN)  IV Stopped (07/08/19 0130)  . dexmedetomidine (PRECEDEX) IV infusion Stopped (07/07/19 1922)  . fentaNYL infusion INTRAVENOUS 200 mcg/hr (07/08/19 0819)  . levETIRAcetam 400 mL/hr at 07/08/19 0819  . norepinephrine (LEVOPHED) Adult infusion 4 mcg/min (07/08/19 0819)  . norepinephrine    . phenytoin (DILANTIN) IV Stopped (07/08/19 0557)  . propofol (DIPRIVAN) infusion 30 mcg/kg/min (07/08/19 5465)   Scheduled Medications: . sodium chloride   Intravenous Once  . acetaminophen  650 mg Oral Once  . chlorhexidine gluconate (MEDLINE KIT)  15 mL Mouth Rinse BID  . Chlorhexidine Gluconate Cloth  6 each Topical Daily  . dexamethasone (DECADRON) injection  10 mg Intravenous Q6H  . dexmedetomidine   1 mcg/kg Intravenous Once  . diphenhydrAMINE  25 mg Intravenous Once  . free water  100 mL Per Tube Q4H  . insulin aspart  0-20 Units Subcutaneous Q4H  . mouth rinse  15 mL Mouth Rinse 10 times per day  . pantoprazole sodium  40 mg Per Tube Daily  . senna-docusate  2 tablet Per Tube BID  . sodium chloride flush  10-40 mL Intracatheter Q12H  . tamsulosin  0.4 mg Oral Daily   PRN Medications: acetaminophen **OR** acetaminophen, belladonna-opium, HYDROmorphone (DILAUDID) injection, midazolam, midazolam, ondansetron **OR** ondansetron (ZOFRAN) IV, sodium chloride flush, ziprasidone Hemodynamic parameters: CVP:  [11 mmHg] 11 mmHg Intake/Output: 04/30 0701 - 05/01 0700 In: 4921.8 [I.V.:2471.3; IV Piggyback:2450.6] Out: 1000 [Urine:1000]  Ventilator  Settings: Vent Mode: PRVC FiO2 (%):  [40 %] 40 % Set Rate:  [16 bmp] 16 bmp Vt Set:  [500 mL] 500 mL PEEP:  [5 cmH20] 5 cmH20 Plateau Pressure:  [  11 cmH20-13 cmH20] 12 cmH20    LAB RESULTS:  Basic Metabolic Panel: Recent Labs  Lab 07/03/19 2216 07/03/19 2216 07/04/19 1832 07/04/19 1832 07/05/19 0503 07/05/19 0503 07/07/19 0237 07/08/19 0600  NA 128*  --  129*  --  135  --  145 150*  K 4.0   < > 4.0   < > 5.1   < > 4.2 3.4*  CL 93*  --  96*  --  105  --  114* 119*  CO2 23  --  22  --  20*  --  23 26  GLUCOSE 443*  --  348*  --  348*  --  297* 160*  BUN 28*  --  22*  --  19  --  21* 21*  CREATININE 1.71*  --  1.48*  --  1.18  --  1.02 1.08  CALCIUM 8.9  --  8.9  --  8.6*  --  8.5* 8.3*  MG  --   --   --   --   --   --   --  2.5*  PHOS  --   --   --   --   --   --   --  2.3*   < > = values in this interval not displayed.   Liver Function Tests: Recent Labs  Lab 07/04/19 1832 07/07/19 0237  AST 27 12*  ALT 17 15  ALKPHOS 116 80  BILITOT 1.6* 1.2  PROT 8.1 6.6  ALBUMIN 3.9 2.9*   No results for input(s): LIPASE, AMYLASE in the last 168 hours. No results for input(s): AMMONIA in the last 168 hours. CBC: Recent Labs   Lab 07/03/19 2216 07/04/19 1832 07/05/19 0743 07/07/19 0237 07/08/19 0345  WBC 19.7* 22.5* 13.3* 19.0* 19.2*  NEUTROABS 13.6* 17.0*  --   --   --   HGB 11.1* 11.6* 11.2* 10.7* 7.9*  HCT 33.8* 35.0* 34.5* 32.5* 24.7*  MCV 65.9* 65.8* 66.5* 66.2* 67.5*  PLT 352 352 307 283 181   Cardiac Enzymes: No results for input(s): CKTOTAL, CKMB, CKMBINDEX, TROPONINI in the last 168 hours. BNP: Invalid input(s): POCBNP CBG: Recent Labs  Lab 07/07/19 1156 07/07/19 1944 07/08/19 0105 07/08/19 0419 07/08/19 0755  GLUCAP 244* 288* 218* 154* 126*       IMAGING RESULTS:  Imaging: EEG  Result Date: 07/07/2019 Alexis Goodell, MD     07/07/2019  2:03 PM ELECTROENCEPHALOGRAM REPORT Patient: Raiford Noble       Room #: IC13A-AA EEG No. ID: [id number] Age: 54 y.o.        Sex: male Requesting Physician: Swayze Report Date:  07/07/2019       Interpreting Physician: Alexis Goodell History: Joquan Lotz is an 54 y.o. male with altered mental status and seizures Medications: Keppra, Dilantin, Acyclovir, Omnipen, Rocephin, Decadron, Insulin, Ativan Conditions of Recording:  This is a 21 channel routine scalp EEG performed with bipolar and monopolar montages arranged in accordance to the international 10/20 system of electrode placement. One channel was dedicated to EKG recording. The patient is in the altered state state. Description:  The background activity is slow and poorly organized.  It consists of a polymorphic delta activity that is diffusely distributed.  There is superimposed fast beta activity noted as well.  There is no noted hemispheric asymmetry. There is no noted epileptiform activity.  There are no incidences of eye deviation or head turning.  Hyperventilation and intermittent photic stimulation were not performed. IMPRESSION: This is an  abnormal EEG secondary to general background slowing.  This finding may be seen with a diffuse disturbance that is etiologically nonspecific, but may  include a metabolic encephalopathy, among other possibilities.  No epileptiform activity was noted.  The superimposed beta activity noted is consistent with medication effect.  Alexis Goodell, MD Neurology [phone] 07/07/2019, 1:57 PM   EEG  Result Date: 07/06/2019 Alexis Goodell, MD     07/06/2019  4:17 PM ELECTROENCEPHALOGRAM REPORT Patient: Ilir Mahrt       Room #: IC13A-AA EEG No. ID: [id number] Age: 54 y.o.        Sex: male Requesting Physician: Jamse Arn Report Date:  07/06/2019       Interpreting Physician: Alexis Goodell History: Domique Clapper is an 54 y.o. male with new onset seizures Medications: Acyclovir, Ampicillin, Suboxone, Rocephin, Decadron, Insulin, Keppra, Flomax, Protonix Conditions of Recording:  This is a 21 channel routine scalp EEG performed with bipolar and monopolar montages arranged in accordance to the international 10/20 system of electrode placement. One channel was dedicated to EKG recording. The patient is in the poorly responsive state. Description:  The background activity is slow and poorly organized.  This slow activity consists of a low voltage delta activity that is diffusely distributed.  There is superimposed beta activity noted as well.  The patient is positioned on his left.  Despite this there does appear some intermittent further slowing over the right temporoparietal region.   Frequent sharp waves are noted in this region as well.  The patient has periods when he further turns his head to the left and extensive artifact is noted.  Patient is able to respond to questioning at these times.  Patient also has periods of build up in beta activity over the right hemisphere without change clinically.  Hyperventilation and intermittent photic stimulation were not performed. IMPRESSION: This is an abnormal electroencephalogram secondary to general background slowing with focal slowing and sharp waves intermittently noted in the right temporoparietal region.  This is  consistent with the patient's history of focal left sided seizures.  Patient also with episodes of fast beta activity-some with head turning but responsiveness and some with no change in clinical activity.  Difficult clear characterization but can not rule out focal seizure activity.  Alexis Goodell, MD Neurology [phone] 07/06/2019, 4:04 PM   DG Abd 1 View  Result Date: 07/07/2019 CLINICAL DATA:  Orogastric tube placement. EXAM: ABDOMEN - 1 VIEW COMPARISON:  Same day. FINDINGS: The bowel gas pattern is normal. Distal tip of enteric tube is seen in expected position of proximal stomach. No radio-opaque calculi or other significant radiographic abnormality are seen. IMPRESSION: Distal tip of enteric tube seen in expected position of proximal stomach. Electronically Signed   By: Marijo Conception M.D.   On: 07/07/2019 15:18   DG Abd 1 View  Result Date: 07/07/2019 CLINICAL DATA:  Orogastric tube placement. EXAM: ABDOMEN - 1 VIEW COMPARISON:  None. FINDINGS: The bowel gas pattern is normal. Distal tip of enteric tube is seen in expected position of distal esophagus. No radio-opaque calculi or other significant radiographic abnormality are seen. IMPRESSION: Distal tip of enteric tube is seen in expected position of distal esophagus. No evidence of bowel obstruction or ileus. Electronically Signed   By: Marijo Conception M.D.   On: 07/07/2019 13:50   DG Abd 1 View  Result Date: 07/06/2019 CLINICAL DATA:  Possible heavy metal poisoning EXAM: ABDOMEN - 1 VIEW COMPARISON:  None. FINDINGS: Scattered large and small bowel gas  is noted. Mild retained fecal material is noted. No intraluminal radiopacities are seen. Diffuse thickening of the femoral cortex is noted bilaterally in a symmetrical fashion. This may be related to the patient's underlying sickle cell disease or possibly underlying Paget's. No other focal abnormality is seen. IMPRESSION: Thickened cortex within the femurs in a symmetrical fashion  bilaterally likely related to the patient's underlying sickle cell disease or possible Paget's disease. No definitive radiopacities are noted within the bowel to correspond with the given clinical history. Electronically Signed   By: Inez Catalina M.D.   On: 07/06/2019 16:12   MR BRAIN WO CONTRAST  Addendum Date: 07/06/2019   ADDENDUM REPORT: 07/06/2019 18:10 ADDENDUM: These results were called by telephone at the time of interpretation on 07/06/2019 at 6:10 pm to provider [facility] , who verbally acknowledged these results. Electronically Signed   By: Kellie Simmering DO   On: 07/06/2019 18:10   Result Date: 07/06/2019 CLINICAL DATA:  Ophthalmoplegia; encephalopathy, seizure, abnormal neuro exam. Additional history provided: Prior history of sickle thalassemia, COPD, diabetes and hypertension, presenting with headache and aching all over, altered at times with intermittent shaking of left arm and leg with deviation head to the left while awake, findings felt to reflect partial seizure. EXAM: MRI HEAD WITHOUT CONTRAST TECHNIQUE: Multiplanar, multiecho pulse sequences of the brain and surrounding structures were obtained without intravenous contrast. COMPARISON:  CT venogram 07/04/2019, noncontrast head CT and CT angiogram head/neck 07/04/2019 FINDINGS: Brain: The patient was unable to tolerate the full examination. As a result only axial and coronal diffusion-weighted imaging, a sagittal T1 weighted sequence, an axial T2* sequence, an axial T2 FLAIR sequence and a coronal T2 weighted sequence were obtained. There is moderate/severe motion degradation of the sagittal T1 weighted sequence and axial T2 FLAIR sequence. There is severe motion degradation of the coronal T2 weighted sequence. There is subtle cortical diffusion-weighted signal abnormality throughout much of the right parietal lobe and extending inferiorly to involve portions of the right occipital lobe and posteromedial right temporal lobe. There  are a few more punctate foci of restricted diffusion within the high right parietal lobe (series 5, image 30). Evaluation is limited due to significant motion degradation, but there appears to be corresponding T2/FLAIR hyperintensity at these sites. 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 Multifocal T2/FLAIR hyperintensity within the cerebral white matter and pons is nonspecific but likely reflect sequela of chronic small vessel ischemia. There is suggestion of a thin right-sided subdural hygroma measuring approximately 5 mm in thickness greatest overlying the right frontal lobe (for instance as seen on series 8, image 16). This finding was not definitively present on prior exams. No intracranial mass is identified. No midline shift. No chronic intracranial blood products. Vascular: Poorly assessed on the acquired sequences. Skull and upper cervical spine: No focal marrow lesion is identified within the limitations of significant motion degradation. Sinuses/Orbits: The orbits and paranasal sinuses are poorly assessed due to the degree of motion degradation. IMPRESSION: 1. Cortical diffusion-weighted signal abnormality and corresponding T2/FLAIR hyperintensity involving much of the right parietal lobe and extending inferiorly to involve portions of the right occipital lobe and posteromedial right temporal lobe. There are a few additional punctate foci of restricted diffusion within the high right parietal lobe. Findings are suspicious for seizure related changes and/or cerebritis. Acute ischemia cannot be excluded, particularly for the punctate foci within the high right parietal lobe. 2. 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 This finding was not definitively present on  prior exams. 3. 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 4. Chronic small vessel ischemic changes within the cerebral white matter and pons. Electronically  Signed: By: Kellie Simmering DO On: 07/06/2019 17:57   MR BRAIN W WO CONTRAST  Result Date: 07/08/2019 CLINICAL DATA:  Seizures. EXAM: MRI HEAD WITHOUT AND WITH CONTRAST TECHNIQUE: Multiplanar, multiecho pulse sequences of the brain and surrounding structures were obtained without and with intravenous contrast. CONTRAST:  39m GADAVIST GADOBUTROL 1 MMOL/ML IV SOLN COMPARISON:  Brain MRI 07/06/2019 FINDINGS: Brain: Cortical diffusion abnormality within the right hemisphere has resolved aside from a small area in the right occipital lobe. Areas of encephalomalacia in both frontal lobes are unchanged. There is mild multifocal white matter hyperintensity. There is an old left cerebellar infarct. No chronic microhemorrhage. Normal midline structures. 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 Skull and upper cervical spine: Normal marrow signal. Sinuses/Orbits: Negative. Other: None. IMPRESSION: 1. No acute intracranial abnormality. 2. Cortical diffusion abnormality within the right hemisphere has resolved aside from a small area in the right occipital lobe. This may indicate postictal change or small area of subacute ischemia. 3. Unchanged areas of encephalomalacia in the frontal lobes bilaterally. Old left cerebellar infarct. Electronically Signed   By: KUlyses JarredM.D.   On: 07/08/2019 00:45   DG Chest Port 1 View  Result Date: 07/07/2019 CLINICAL DATA:  Central line placement EXAM: PORTABLE CHEST 1 VIEW COMPARISON:  Radiographs 09/19/2018 FINDINGS: Endotracheal tube terminates in the mid trachea, 4.5 cm from the carina. Left IJ approach central venous catheter tip terminates at the brachiocephalic-caval confluence. Transesophageal tube tip again seen terminating in the proximal stomach with the side port at the level of the GE junction. Telemetry leads overlie the chest. Stable cardiomegaly with central vascular congestion and diffuse mixed interstitial and patchy  airspace opacity concerning for interstitial and alveolar pulmonary edema. Hazy obscuration of the right hemidiaphragm may reflect some trace effusion. No pneumothorax. No acute osseous or soft tissue abnormality. Sclerotic changes of the left humeral head likely reflect sequela of prior avascular necrosis. Additional degenerative changes in the left shoulder and spine. IMPRESSION: 1. Left IJ approach central venous catheter tip at the left brachiocephalic-caval confluence. 2. Endotracheal tube terminates in the mid trachea, 4.5 cm from the carina. 3. Transesophageal tube tip again seen terminating in the proximal stomach with the side port at the level of the GE junction. Consider advancing 3 cm to position in the gastric body. 4. Persistent features suggestive of interstitial and alveolar edema. Likely developing right pleural effusion. Electronically Signed   By: PLovena LeM.D.   On: 07/07/2019 22:32   Portable Chest x-ray  Result Date: 07/07/2019 CLINICAL DATA:  Endotracheal tube placement. EXAM: PORTABLE CHEST 1 VIEW COMPARISON:  July 04, 2019. FINDINGS: Stable cardiomegaly with central pulmonary vascular congestion. Endotracheal and nasogastric tubes are unchanged in position. Increased bilateral interstitial densities are noted concerning for pulmonary edema. No pneumothorax or pleural effusion is noted. Bony thorax is unremarkable. IMPRESSION: Stable support apparatus. Stable cardiomegaly with central pulmonary vascular congestion. Increased bilateral interstitial densities are noted concerning for pulmonary edema. Electronically Signed   By: JMarijo ConceptionM.D.   On: 07/07/2019 13:50   DG FL GUIDED LUMBAR PUNCTURE  Result Date: 07/07/2019 CLINICAL DATA:  Lumbar puncture. EXAM: DIAGNOSTIC LUMBAR PUNCTURE UNDER FLUOROSCOPIC GUIDANCE FLUOROSCOPY TIME:  Fluoroscopy Time:  0 minutes 24 seconds Radiation Exposure Index (if provided by the fluoroscopic device): 11.1  mGy PROCEDURE: After discussing the  risks and benefits of this procedure with the patient's sister informed consent was obtained. Back was sterilely prepped and draped. 22 gauge needle was advanced into the L4-L5 space and clear CSF obtained. 8 cc obtained and sent in 4 separate tubes to the ordered labs. Needle withdrawn. Hemostasis achieved. No complications. IMPRESSION: Successful fluoroscopically directed lumbar puncture. Electronically Signed   By: Marcello Moores  Register   On: 07/07/2019 17:05   _0 @ EEG  Result Date: 07/07/2019 Alexis Goodell, MD     07/07/2019  2:03 PM ELECTROENCEPHALOGRAM REPORT Patient: Raiford Noble       Room #: IC13A-AA EEG No. ID: [id number] Age: 54 y.o.        Sex: male Requesting Physician: Swayze Report Date:  07/07/2019       Interpreting Physician: Alexis Goodell History: Corie Vavra is an 54 y.o. male with altered mental status and seizures Medications: Keppra, Dilantin, Acyclovir, Omnipen, Rocephin, Decadron, Insulin, Ativan Conditions of Recording:  This is a 21 channel routine scalp EEG performed with bipolar and monopolar montages arranged in accordance to the international 10/20 system of electrode placement. One channel was dedicated to EKG recording. The patient is in the altered state state. Description:  The background activity is slow and poorly organized.  It consists of a polymorphic delta activity that is diffusely distributed.  There is superimposed fast beta activity noted as well.  There is no noted hemispheric asymmetry. There is no noted epileptiform activity.  There are no incidences of eye deviation or head turning.  Hyperventilation and intermittent photic stimulation were not performed. IMPRESSION: This is an abnormal EEG secondary to general background slowing.  This finding may be seen with a diffuse disturbance that is etiologically nonspecific, but may include a metabolic encephalopathy, among other possibilities.  No epileptiform activity was noted.  The superimposed beta activity  noted is consistent with medication effect.  Alexis Goodell, MD Neurology [phone] 07/07/2019, 1:57 PM   DG Abd 1 View  Result Date: 07/07/2019 CLINICAL DATA:  Orogastric tube placement. EXAM: ABDOMEN - 1 VIEW COMPARISON:  Same day. FINDINGS: The bowel gas pattern is normal. Distal tip of enteric tube is seen in expected position of proximal stomach. No radio-opaque calculi or other significant radiographic abnormality are seen. IMPRESSION: Distal tip of enteric tube seen in expected position of proximal stomach. Electronically Signed   By: Marijo Conception M.D.   On: 07/07/2019 15:18   DG Abd 1 View  Result Date: 07/07/2019 CLINICAL DATA:  Orogastric tube placement. EXAM: ABDOMEN - 1 VIEW COMPARISON:  None. FINDINGS: The bowel gas pattern is normal. Distal tip of enteric tube is seen in expected position of distal esophagus. No radio-opaque calculi or other significant radiographic abnormality are seen. IMPRESSION: Distal tip of enteric tube is seen in expected position of distal esophagus. No evidence of bowel obstruction or ileus. Electronically Signed   By: Marijo Conception M.D.   On: 07/07/2019 13:50   MR BRAIN W WO CONTRAST  Result Date: 07/08/2019 CLINICAL DATA:  Seizures. EXAM: MRI HEAD WITHOUT AND WITH CONTRAST TECHNIQUE: Multiplanar, multiecho pulse sequences of the brain and surrounding structures were obtained without and with intravenous contrast. CONTRAST:  55m GADAVIST GADOBUTROL 1 MMOL/ML IV SOLN COMPARISON:  Brain MRI 07/06/2019 FINDINGS: Brain: Cortical diffusion abnormality within the right hemisphere has resolved aside from a small area in the right occipital lobe. Areas of encephalomalacia in both frontal lobes are unchanged. There is mild multifocal white matter hyperintensity.  There is an old left cerebellar infarct. No chronic microhemorrhage. Normal midline structures. 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 Skull and upper  cervical spine: Normal marrow signal. Sinuses/Orbits: Negative. Other: None. IMPRESSION: 1. No acute intracranial abnormality. 2. Cortical diffusion abnormality within the right hemisphere has resolved aside from a small area in the right occipital lobe. This may indicate postictal change or small area of subacute ischemia. 3. Unchanged areas of encephalomalacia in the frontal lobes bilaterally. Old left cerebellar infarct. Electronically Signed   By: Ulyses Jarred M.D.   On: 07/08/2019 00:45   DG Chest Port 1 View  Result Date: 07/07/2019 CLINICAL DATA:  Central line placement EXAM: PORTABLE CHEST 1 VIEW COMPARISON:  Radiographs 09/19/2018 FINDINGS: Endotracheal tube terminates in the mid trachea, 4.5 cm from the carina. Left IJ approach central venous catheter tip terminates at the brachiocephalic-caval confluence. Transesophageal tube tip again seen terminating in the proximal stomach with the side port at the level of the GE junction. Telemetry leads overlie the chest. Stable cardiomegaly with central vascular congestion and diffuse mixed interstitial and patchy airspace opacity concerning for interstitial and alveolar pulmonary edema. Hazy obscuration of the right hemidiaphragm may reflect some trace effusion. No pneumothorax. No acute osseous or soft tissue abnormality. Sclerotic changes of the left humeral head likely reflect sequela of prior avascular necrosis. Additional degenerative changes in the left shoulder and spine. IMPRESSION: 1. Left IJ approach central venous catheter tip at the left brachiocephalic-caval confluence. 2. Endotracheal tube terminates in the mid trachea, 4.5 cm from the carina. 3. Transesophageal tube tip again seen terminating in the proximal stomach with the side port at the level of the GE junction. Consider advancing 3 cm to position in the gastric body. 4. Persistent features suggestive of interstitial and alveolar edema. Likely developing right pleural effusion.  Electronically Signed   By: Lovena Le M.D.   On: 07/07/2019 22:32   Portable Chest x-ray  Result Date: 07/07/2019 CLINICAL DATA:  Endotracheal tube placement. EXAM: PORTABLE CHEST 1 VIEW COMPARISON:  July 04, 2019. FINDINGS: Stable cardiomegaly with central pulmonary vascular congestion. Endotracheal and nasogastric tubes are unchanged in position. Increased bilateral interstitial densities are noted concerning for pulmonary edema. No pneumothorax or pleural effusion is noted. Bony thorax is unremarkable. IMPRESSION: Stable support apparatus. Stable cardiomegaly with central pulmonary vascular congestion. Increased bilateral interstitial densities are noted concerning for pulmonary edema. Electronically Signed   By: Marijo Conception M.D.   On: 07/07/2019 13:50   DG FL GUIDED LUMBAR PUNCTURE  Result Date: 07/07/2019 CLINICAL DATA:  Lumbar puncture. EXAM: DIAGNOSTIC LUMBAR PUNCTURE UNDER FLUOROSCOPIC GUIDANCE FLUOROSCOPY TIME:  Fluoroscopy Time:  0 minutes 24 seconds Radiation Exposure Index (if provided by the fluoroscopic device): 11.1 mGy PROCEDURE: After discussing the risks and benefits of this procedure with the patient's sister informed consent was obtained. Back was sterilely prepped and draped. 22 gauge needle was advanced into the L4-L5 space and clear CSF obtained. 8 cc obtained and sent in 4 separate tubes to the ordered labs. Needle withdrawn. Hemostasis achieved. No complications. IMPRESSION: Successful fluoroscopically directed lumbar puncture. Electronically Signed   By: Marcello Moores  Register   On: 07/07/2019 17:05     ASSESSMENT AND PLAN    -Multidisciplinary rounds held today  Recurrent partial seizures with status epilepticus  -Status post endotracheal intubation for airway protection -Currently on propofol for sedation with complete resolution of seizure activity -Neurology on case-appreciate input continue Dilantin and Keppra -Status post  lumbar puncture CSF studies in  progress -Infectious disease on case continue with antimicrobials as per ID -MRI brain today to be repeated -continue Full MV support -continue Bronchodilator Therapy -Wean Fio2 and PEEP as tolerated -will perform SAT/SBT when respiratory parameters are met   Encephalopathy  -Present on admission -Possibly due to illicit drug use as patient has history of cocaine and heroin abuse as well as numerous admissions for severe opiate dependence.  Patient admits to active illicit drug use. -Additional etiologies include inflammatory, vaso-occlusive due to underlying sickle cell/thalassemia, infectious, vasospastic, seizure related. -Multiple consultants on case appreciate collaboration with such a complicated case -Additional testing is in progress ICU telemetry monitoring -Decreasing dexamethasone to 10 mg twice daily from 10 mg 4 times daily -CSF profile with atypical profile and elevated protein potentially viral related versus inflammatory/autoimmune, currently on acyclovir discussed with Dr. Steva Ready planning to DC Zovirax, oligoclonal bands and bio fire testing in process   Sickle cell beta thalassemia   -Hematology/oncology on case-appreciate input   -Monitor hemoglobin if less than 8 transfuse 1 unit PRBC  -No indication for Plex at this time    Right foot infected diabetic ulcer  -Status post culture with GPC positive -ID on case continue antimicrobials as per ID -Patient had [facility] foot [facility] without osteo-February 2021   ID -continue IV abx as prescibed -follow up cultures  GI/Nutrition GI PROPHYLAXIS as indicated DIET-->TF's as tolerated Constipation protocol as indicated  ENDO - ICU hypoglycemic\Hyperglycemia protocol -check FSBS per protocol   ELECTROLYTES -follow labs as needed -replace as needed -pharmacy consultation   DVT/GI PRX ordered -SCDs  TRANSFUSIONS AS NEEDED MONITOR FSBS ASSESS the need for LABS as needed   Critical care provider  statement:    Critical care time (minutes):  109   Critical care time was exclusive of:  Separately billable procedures and treating other patients   Critical care was necessary to treat or prevent imminent or life-threatening deterioration of the following conditions:   Status epilepticus, partial seizures, sickle cell anemia, thalassemia, opioid dependence, cerebritis, encephalopathy, history of illicit drug use, diabetic foot ulcers, neuropathy, multiple comorbid conditions.   Critical care was time spent personally by me on the following activities:  Development of treatment plan with patient or surrogate, discussions with consultants, evaluation of patient's response to treatment, examination of patient, obtaining history from patient or surrogate, ordering and performing treatments and interventions, ordering and review of laboratory studies and re-evaluation of patient's condition.  I assumed direction of critical care for this patient from another provider in my specialty: no    This document was prepared using Dragon voice recognition software and may include unintentional dictation errors.    Ottie Glazier, M.D.  Division of [place]

## 2019-07-07 NOTE — Progress Notes (Signed)
Pharmacy Electrolyte Monitoring Consult:  Pharmacy consulted to assist in monitoring and replacing electrolytes in this 54 y.o. male admitted on 07/04/2019 with Fall, Headache, Eye Problem, and Hyperglycemia   Labs:  Sodium (mmol/L)  Date Value  07/07/2019 145   Potassium (mmol/L)  Date Value  07/07/2019 4.2   Calcium (mg/dL)  Date Value  14/84/0397 8.5 (L)   Albumin (g/dL)  Date Value  95/36/9223 2.9 (L)    Assessment/Plan: 1. Electrolytes: MIVF NS at 121mL/hr transitioned to LR at 1mL/hr. No further replacement warranted. Will obtain all electrolytes with am labs. Will replace to maintain potassium ~ 4 and goal magnesium ~ 2.   2. Glucose: Patient on dexamethazone 10mg  IV Q6hr. Patient transitioned to Phase I Hyperglycemia protocol. Will continue to follow and adjust per protocol.   3. Constipation: regimen includes fentanyl infusion. Tube feeds not being initiated at this time. Will order senna/docusate 2 tabs VT BID to start on 5/1.   Pharmacy will continue to monitor and adjust per consult.    Yaire Kreher L 07/07/2019 3:50 PM

## 2019-07-07 NOTE — Progress Notes (Signed)
Pharmacy Antibiotic Note  John Haas is a 54 y.o. male admitted on 07/04/2019 requiring coverage for herpes encephalitis.  Pharmacy has been consulted for Acyclovir dosing.  Plan: Continue Acyclovir 10mg /Kg using AdjBW (920mg ) IV q8hrs.  Pharmacy will continue to monitor and adjust per consult.  Height: 6\' 3"  (190.5 cm) Weight: 102.7 kg (226 lb 6.6 oz) IBW/kg (Calculated) : 84.5  Temp (24hrs), Avg:97.6 F (36.4 C), Min:96.3 F (35.7 C), Max:98.4 F (36.9 C)  Recent Labs  Lab 07/03/19 2216 07/04/19 1832 07/05/19 0503 07/05/19 0743 07/07/19 0237  WBC 19.7* 22.5*  --  13.3* 19.0*  CREATININE 1.71* 1.48* 1.18  --  1.02    Estimated Creatinine Clearance: 107.5 mL/min (by C-G formula based on SCr of 1.02 mg/dL).    Allergies  Allergen Reactions  . Ketamine Hives  . Nubain [Nalbuphine Hcl] Hives  . Stadol [Butorphanol] Hives    Antimicrobials this admission: Acyclovir 4/28  >>  Ampicillin 4/28  >>  Ceftriaxone 4/28 >> 4/29 Vanc 4/27 >> 4/28  Dose adjustments this admission: None  Microbiology results: 4/29 WCx: rare GPC, pending 4/27 BCx:  NG x 3 days 4/28 MRSA:  Neg 4/27 Flu, Covid):  Negative   Thank you for allowing pharmacy to be a part of this patient's care.  5/27, PharmD 07/07/2019 3:50 PM

## 2019-07-07 NOTE — Progress Notes (Signed)
Hematology/Oncology Consult note [facility]  [phone] [phone]  Patient Care Team: System, Pcp Not In as PCP - General   Name of the patient: John Haas  [id number]  1965/04/13   Date of visit:07/07/2019   Interval history- patient sedated this afternoon due to ongoing seizures with plans for intubation    Review of systems- Review of Systems  Unable to perform ROS: Other    sedated  Allergies  Allergen Reactions  . Ketamine Hives  . Nubain [Nalbuphine Hcl] Hives  . Stadol [Butorphanol] Hives     Past Medical History:  Diagnosis Date  . COPD (chronic obstructive pulmonary disease) (HCC)   . Diabetes mellitus without complication (HCC)   . Heart attack (HCC)   . Hypertension   . 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 Sickle cell anemia (HCC)      Past Surgical History:  Procedure Laterality Date  . ANKLE SURGERY    . KNEE SURGERY    . PORT-A-CATH REMOVAL      Social History   Socioeconomic History  . Marital status: Single    Spouse name: Not on file  . Number of children: Not on file  . Years of education: Not on file  . Highest education level: Not on file  Occupational History  . Not on file  Tobacco Use  . Smoking status: Current Every Day Smoker  . Smokeless tobacco: Never Used  Substance and Sexual Activity  . Alcohol use: No  . Drug use: No  . Sexual activity: Not on file  Other Topics Concern  . Not on file  Social History Narrative  . Not on file   Social Determinants of Health   Financial Resource Strain:   . Difficulty of Paying Living Expenses:   Food Insecurity:   . Worried About Programme researcher, broadcasting/film/video in the Last Year:   . Barista in the Last Year:   Transportation Needs:   . Freight forwarder (Medical):   Marland Kitchen Lack of Transportation (Non-Medical):   Physical Activity:   . Days of Exercise per Week:   . Minutes  of Exercise per Session:   Stress:   . Feeling of Stress :   Social Connections:   . Frequency of Communication with Friends and Family:   . Frequency of Social Gatherings with Friends and Family:   . Attends Religious Services:   . Active Member of Clubs or Organizations:   . Attends Banker Meetings:   Marland Kitchen Marital Status:   Intimate Partner Violence:   . Fear of Current or Ex-Partner:   . Emotionally Abused:   Marland Kitchen Physically Abused:   . Sexually Abused:     No family history on file.   Current Facility-Administered Medications:  .  0.9 %  sodium chloride infusion, , Intravenous, Continuous, Lindajo Royal V, MD, Last Rate: 125 mL/hr at 07/07/19 0600, Rate Verify at 07/07/19 0600 .  acetaminophen (TYLENOL) tablet 650 mg, 650 mg, Oral, Q6H PRN, 650 mg at 07/05/19 1940 **OR** acetaminophen (TYLENOL) suppository 650 mg, 650 mg, Rectal, Q6H PRN, Andris Baumann, MD .  acyclovir (ZOVIRAX) 920 mg in dextrose 5 % 150 mL IVPB, 920 mg, Intravenous, Q8H, Hall, Scott A, RPH, Last Rate: 168.4 mL/hr at 07/07/19 0816, 920 mg at 07/07/19 0816 .  ampicillin (OMNIPEN) 2 g in sodium chloride 0.9 % 100 mL IVPB, 2 g, Intravenous,  Q4H, Andris Baumann, MD, Last Rate: 300 mL/hr at 07/07/19 6734, 2 g at 07/07/19 1937 .  buprenorphine-naloxone (SUBOXONE) 8-2 mg per SL tablet 3 tablet, 3 tablet, Sublingual, Daily, Pieter Partridge, MD, 3 tablet at 07/05/19 1342 .  Chlorhexidine Gluconate Cloth 2 % PADS 6 each, 6 each, Topical, Daily, Andris Baumann, MD, 6 each at 07/06/19 1036 .  dexamethasone (DECADRON) injection 10 mg, 10 mg, Intravenous, Q6H, Andris Baumann, MD, 10 mg at 07/07/19 0335 .  gadobutrol (GADAVIST) 1 MMOL/ML injection 10 mL, 10 mL, Intravenous, Once PRN, Luberta Robertson, Horatio Pel Tublu, MD .  insulin aspart (novoLOG) injection 0-15 Units, 0-15 Units, Subcutaneous, TID WC, Andris Baumann, MD, 8 Units at 07/07/19 [phone]  insulin aspart (novoLOG) injection 0-5 Units, 0-5 Units,  Subcutaneous, QHS, Andris Baumann, MD, 2 Units at 07/06/19 2109 .  insulin detemir (LEVEMIR) injection 25 Units, 25 Units, Subcutaneous, BID, Pieter Partridge, MD, 25 Units at 07/06/19 2109 .  levETIRAcetam (KEPPRA) IVPB 1000 mg/100 mL premix, 1,000 mg, Intravenous, Q12H, Thana Farr, MD, Last Rate: 400 mL/hr at 07/07/19 0744, 1,000 mg at 07/07/19 0744 .  LORazepam (ATIVAN) injection 1-2 mg, 1-2 mg, Intravenous, Q2H PRN, Jimmye Norman, NP, 2 mg at 07/07/19 0743 .  MEDLINE mouth rinse, 15 mL, Mouth Rinse, BID, Manuela Schwartz, NP, 15 mL at 07/06/19 2108 .  nicotine (NICODERM CQ - dosed in mg/24 hours) patch 21 mg, 21 mg, Transdermal, Daily, Leandro Reasoner Tublu, MD, 21 mg at 07/06/19 1526 .  ondansetron (ZOFRAN) tablet 4 mg, 4 mg, Oral, Q6H PRN **OR** ondansetron (ZOFRAN) injection 4 mg, 4 mg, Intravenous, Q6H PRN, Andris Baumann, MD, 4 mg at 07/06/19 0300 .  pantoprazole (PROTONIX) EC tablet 40 mg, 40 mg, Oral, Daily, Leandro Reasoner Tublu, MD, 40 mg at 07/05/19 1457 .  phenytoin (DILANTIN) injection 100 mg, 100 mg, Intravenous, Q8H, [place], Hubbard Hartshorn, NP, 100 mg at 07/07/19 0526 .  sodium chloride flush (NS) 0.9 % injection 10-40 mL, 10-40 mL, Intracatheter, PRN, Leandro Reasoner Tublu, MD .  tamsulosin [employer]) capsule 0.4 mg, 0.4 mg, Oral, Daily, Leandro Reasoner Tublu, MD, 0.4 mg at 07/05/19 1457  Physical exam:  Vitals:   07/07/19 0400 07/07/19 0500 07/07/19 0619 07/07/19 0700  BP: 139/85 125/75 101/63 133/81  Pulse: (!) 108 (!) 110 99 98  Resp:   11 15  Temp:    98.4 F (36.9 C)  TempSrc:      SpO2: 98% 93% 93% 92%  Weight:      Height:       Physical Exam Constitutional:      Comments: Patient sedated and unresponsive  Pulmonary:     Effort: Pulmonary effort is normal.      CMP Latest Ref Rng & Units 07/07/2019  Glucose 70 - 99 mg/dL 097(D)  BUN 6 - 20 mg/dL 53(G)  Creatinine 9.92 - 1.24 mg/dL 4.26  Sodium 834 - 196 mmol/L 145   Potassium 3.5 - 5.1 mmol/L 4.2  Chloride 98 - 111 mmol/L 114(H)  CO2 22 - 32 mmol/L 23  Calcium 8.9 - 10.3 mg/dL 2.2(W)  Total Protein 6.5 - 8.1 g/dL 6.6  Total Bilirubin 0.3 - 1.2 mg/dL 1.2  Alkaline Phos 38 - 126 U/L 80  AST 15 - 41 U/L 12(L)  ALT 0 - 44 U/L 15   CBC Latest Ref Rng & Units 07/07/2019  WBC 4.0 - 10.5 K/uL 19.0(H)  Hemoglobin 13.0 - 17.0 g/dL 10.7(L)  Hematocrit 39.0 -  52.0 % 32.5(L)  Platelets 150 - 400 K/uL 283    @  EEG  Result Date: 07/06/2019 Thana Farr, MD     07/06/2019  4:17 PM ELECTROENCEPHALOGRAM REPORT Patient: Vladimir Creeks       Room #: IC13A-AA EEG No. ID: [id number] Age: 54 y.o.        Sex: male Requesting Physician: Luberta Robertson Report Date:  07/06/2019       Interpreting Physician: Thana Farr History: Xayvier Vallez is an 54 y.o. male with new onset seizures Medications: Acyclovir, Ampicillin, Suboxone, Rocephin, Decadron, Insulin, Keppra, Flomax, Protonix Conditions of Recording:  This is a 21 channel routine scalp EEG performed with bipolar and monopolar montages arranged in accordance to the international 10/20 system of electrode placement. One channel was dedicated to EKG recording. The patient is in the poorly responsive state. Description:  The background activity is slow and poorly organized.  This slow activity consists of a low voltage delta activity that is diffusely distributed.  There is superimposed beta activity noted as well.  The patient is positioned on his left.  Despite this there does appear some intermittent further slowing over the right temporoparietal region.   Frequent sharp waves are noted in this region as well.  The patient has periods when he further turns his head to the left and extensive artifact is noted.  Patient is able to respond to questioning at these times.  Patient also has periods of build up in beta activity over the right hemisphere without change clinically.  Hyperventilation and intermittent photic  stimulation were not performed. IMPRESSION: This is an abnormal electroencephalogram secondary to general background slowing with focal slowing and sharp waves intermittently noted in the right temporoparietal region.  This is consistent with the patient's history of focal left sided seizures.  Patient also with episodes of fast beta activity-some with head turning but responsiveness and some with no change in clinical activity.  Difficult clear characterization but can not rule out focal seizure activity.  Thana Farr, MD Neurology [phone] 07/06/2019, 4:04 PM   CT Angio Head W or Wo Contrast  Result Date: 07/04/2019 CLINICAL DATA:  Weakness, syncope. EXAM: CT ANGIOGRAPHY HEAD AND NECK TECHNIQUE: Multidetector CT imaging of the head and neck was performed using the standard protocol during bolus administration of intravenous contrast. Multiplanar CT image reconstructions and MIPs were obtained to evaluate the vascular anatomy. Carotid stenosis measurements (when applicable) are obtained utilizing NASCET criteria, using the distal internal carotid diameter as the denominator. CONTRAST:  75mL OMNIPAQUE IOHEXOL 350 MG/ML SOLN COMPARISON:  None. FINDINGS: CT HEAD FINDINGS Brain: There is no mass, hemorrhage or extra-axial collection. The size and configuration of the ventricles and extra-axial CSF spaces are normal. 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 The brain parenchyma is normal. Skull: The visualized skull base, calvarium and extracranial soft tissues are normal. Sinuses/Orbits: No fluid levels or advanced mucosal thickening of the visualized paranasal sinuses. No mastoid or middle ear effusion. The orbits are normal. CTA NECK FINDINGS SKELETON: There is no bony spinal canal stenosis. No lytic or blastic lesion. OTHER NECK: Normal pharynx, larynx and major salivary glands. No cervical lymphadenopathy. Unremarkable thyroid gland. UPPER CHEST: No pneumothorax or pleural effusion. No nodules or  masses. AORTIC ARCH: There is no calcific atherosclerosis of the aortic arch. There is no aneurysm, dissection or hemodynamically significant stenosis of the visualized portion of the aorta. Conventional 3 vessel aortic branching pattern. The visualized proximal subclavian arteries are widely patent. RIGHT CAROTID SYSTEM: Normal without  aneurysm, dissection or stenosis. LEFT CAROTID SYSTEM: Normal without aneurysm, dissection or stenosis. VERTEBRAL ARTERIES: Codominant configuration. Both origins are clearly patent. There is no dissection, occlusion or flow-limiting stenosis to the skull base (V1-V3 segments). CTA HEAD FINDINGS POSTERIOR CIRCULATION: --Vertebral arteries: Normal V4 segments. --Posterior inferior cerebellar arteries (PICA): Patent origins from the vertebral arteries. --Anterior inferior cerebellar arteries (AICA): Patent origins from the basilar artery. --Basilar artery: Normal. --Superior cerebellar arteries: Normal. --Posterior cerebral arteries: Normal. Both originate from the basilar artery. Posterior communicating arteries (p-comm) are diminutive or absent. ANTERIOR CIRCULATION: --Intracranial internal carotid arteries: Normal. --Anterior cerebral arteries (ACA): Normal. Both A1 segments are present. Patent anterior communicating artery (a-comm). --Middle cerebral arteries (MCA): Normal. VENOUS SINUSES: As permitted by contrast timing, patent. ANATOMIC VARIANTS: None Review of the MIP images confirms the above findings. IMPRESSION: 1. No emergent large vessel occlusion or high-grade stenosis. 2. 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 Electronically Signed   By: Ulyses Jarred M.D.   On: 07/04/2019 21:25   DG Abd 1 View  Result Date: 07/06/2019 CLINICAL DATA:  Possible heavy metal poisoning EXAM: ABDOMEN - 1 VIEW COMPARISON:  None. FINDINGS: Scattered large and small bowel gas is noted. Mild retained fecal material is noted. No intraluminal radiopacities are seen. Diffuse thickening  of the femoral cortex is noted bilaterally in a symmetrical fashion. This may be related to the patient's underlying sickle cell disease or possibly underlying Paget's. No other focal abnormality is seen. IMPRESSION: Thickened cortex within the femurs in a symmetrical fashion bilaterally likely related to the patient's underlying sickle cell disease or possible Paget's disease. No definitive radiopacities are noted within the bowel to correspond with the given clinical history. Electronically Signed   By: Inez Catalina M.D.   On: 07/06/2019 16:12   CT Head Wo Contrast  Result Date: 07/04/2019 CLINICAL DATA:  54 year old male with history of headache. Multiple falls. Seizures. EXAM: CT HEAD WITHOUT CONTRAST TECHNIQUE: Contiguous axial images were obtained from the base of the skull through the vertex without intravenous contrast. COMPARISON:  Head CT 07/03/2019. FINDINGS: Brain: Patchy and confluent areas of decreased attenuation are noted throughout the deep and periventricular white matter of the cerebral hemispheres bilaterally, compatible with chronic microvascular ischemic disease. No evidence of acute infarction, hemorrhage, hydrocephalus, extra-axial collection or mass lesion/mass effect. Vascular: No hyperdense vessel or unexpected calcification. Skull: Normal. Negative for fracture or focal lesion. Sinuses/Orbits: No acute finding. Other: None. IMPRESSION: 1. No acute intracranial abnormalities. 2. Chronic microvascular ischemic changes in the cerebral white matter, as above. Electronically Signed   By: Vinnie Langton M.D.   On: 07/04/2019 18:13   CT Head Wo Contrast  Result Date: 07/03/2019 CLINICAL DATA:  Recent fall with headaches EXAM: CT HEAD WITHOUT CONTRAST TECHNIQUE: Contiguous axial images were obtained from the base of the skull through the vertex without intravenous contrast. COMPARISON:  None. FINDINGS: Brain: Mild atrophic changes are noted. Areas of prior ischemia are seen in the  frontal lobes bilaterally. No findings to suggest acute hemorrhage, acute infarction or space-occupying mass lesion are seen. Vascular: No hyperdense vessel or unexpected calcification. Skull: Normal. Negative for fracture or focal lesion. Sinuses/Orbits: No acute finding. Other: None. IMPRESSION: Mild atrophic changes and areas of prior ischemia without acute abnormality. Electronically Signed   By: Inez Catalina M.D.   On: 07/03/2019 23:13   CT Angio Neck W and/or Wo Contrast  Result Date: 07/04/2019 CLINICAL DATA:  Weakness, syncope. EXAM: CT ANGIOGRAPHY HEAD AND NECK TECHNIQUE: Multidetector CT imaging  of the head and neck was performed using the standard protocol during bolus administration of intravenous contrast. Multiplanar CT image reconstructions and MIPs were obtained to evaluate the vascular anatomy. Carotid stenosis measurements (when applicable) are obtained utilizing NASCET criteria, using the distal internal carotid diameter as the denominator. CONTRAST:  75mL OMNIPAQUE IOHEXOL 350 MG/ML SOLN COMPARISON:  None. FINDINGS: CT HEAD FINDINGS Brain: There is no mass, hemorrhage or extra-axial collection. The size and configuration of the ventricles and extra-axial CSF spaces are normal. 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 The brain parenchyma is normal. Skull: The visualized skull base, calvarium and extracranial soft tissues are normal. Sinuses/Orbits: No fluid levels or advanced mucosal thickening of the visualized paranasal sinuses. No mastoid or middle ear effusion. The orbits are normal. CTA NECK FINDINGS SKELETON: There is no bony spinal canal stenosis. No lytic or blastic lesion. OTHER NECK: Normal pharynx, larynx and major salivary glands. No cervical lymphadenopathy. Unremarkable thyroid gland. UPPER CHEST: No pneumothorax or pleural effusion. No nodules or masses. AORTIC ARCH: There is no calcific atherosclerosis of the aortic arch. There is no aneurysm, dissection or  hemodynamically significant stenosis of the visualized portion of the aorta. Conventional 3 vessel aortic branching pattern. The visualized proximal subclavian arteries are widely patent. RIGHT CAROTID SYSTEM: Normal without aneurysm, dissection or stenosis. LEFT CAROTID SYSTEM: Normal without aneurysm, dissection or stenosis. VERTEBRAL ARTERIES: Codominant configuration. Both origins are clearly patent. There is no dissection, occlusion or flow-limiting stenosis to the skull base (V1-V3 segments). CTA HEAD FINDINGS POSTERIOR CIRCULATION: --Vertebral arteries: Normal V4 segments. --Posterior inferior cerebellar arteries (PICA): Patent origins from the vertebral arteries. --Anterior inferior cerebellar arteries (AICA): Patent origins from the basilar artery. --Basilar artery: Normal. --Superior cerebellar arteries: Normal. --Posterior cerebral arteries: Normal. Both originate from the basilar artery. Posterior communicating arteries (p-comm) are diminutive or absent. ANTERIOR CIRCULATION: --Intracranial internal carotid arteries: Normal. --Anterior cerebral arteries (ACA): Normal. Both A1 segments are present. Patent anterior communicating artery (a-comm). --Middle cerebral arteries (MCA): Normal. VENOUS SINUSES: As permitted by contrast timing, patent. ANATOMIC VARIANTS: None Review of the MIP images confirms the above findings. IMPRESSION: 1. No emergent large vessel occlusion or high-grade stenosis. 2. 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 Electronically Signed   By: Deatra Robinson M.D.   On: 07/04/2019 21:25   MR BRAIN WO CONTRAST  Addendum Date: 07/06/2019   ADDENDUM REPORT: 07/06/2019 18:10 ADDENDUM: These results were called by telephone at the time of interpretation on 07/06/2019 at 6:10 pm to provider [facility] , who verbally acknowledged these results. Electronically Signed   By: Jackey Loge DO   On: 07/06/2019 18:10   Result Date: 07/06/2019 CLINICAL DATA:  Ophthalmoplegia;  encephalopathy, seizure, abnormal neuro exam. Additional history provided: Prior history of sickle thalassemia, COPD, diabetes and hypertension, presenting with headache and aching all over, altered at times with intermittent shaking of left arm and leg with deviation head to the left while awake, findings felt to reflect partial seizure. EXAM: MRI HEAD WITHOUT CONTRAST TECHNIQUE: Multiplanar, multiecho pulse sequences of the brain and surrounding structures were obtained without intravenous contrast. COMPARISON:  CT venogram 07/04/2019, noncontrast head CT and CT angiogram head/neck 07/04/2019 FINDINGS: Brain: The patient was unable to tolerate the full examination. As a result only axial and coronal diffusion-weighted imaging, a sagittal T1 weighted sequence, an axial T2* sequence, an axial T2 FLAIR sequence and a coronal T2 weighted sequence were obtained. There is moderate/severe motion degradation of the sagittal T1 weighted sequence and axial T2 FLAIR sequence.  There is severe motion degradation of the coronal T2 weighted sequence. There is subtle cortical diffusion-weighted signal abnormality throughout much of the right parietal lobe and extending inferiorly to involve portions of the right occipital lobe and posteromedial right temporal lobe. There are a few more punctate foci of restricted diffusion within the high right parietal lobe (series 5, image 30). Evaluation is limited due to significant motion degradation, but there appears to be corresponding T2/FLAIR hyperintensity at these sites. 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 Multifocal T2/FLAIR hyperintensity within the cerebral white matter and pons is nonspecific but likely reflect sequela of chronic small vessel ischemia. There is suggestion of a thin right-sided subdural hygroma measuring approximately 5 mm in thickness greatest overlying the right frontal lobe (for instance as seen on series 8, image 16). This  finding was not definitively present on prior exams. No intracranial mass is identified. No midline shift. No chronic intracranial blood products. Vascular: Poorly assessed on the acquired sequences. Skull and upper cervical spine: No focal marrow lesion is identified within the limitations of significant motion degradation. Sinuses/Orbits: The orbits and paranasal sinuses are poorly assessed due to the degree of motion degradation. IMPRESSION: 1. Cortical diffusion-weighted signal abnormality and corresponding T2/FLAIR hyperintensity involving much of the right parietal lobe and extending inferiorly to involve portions of the right occipital lobe and posteromedial right temporal lobe. There are a few additional punctate foci of restricted diffusion within the high right parietal lobe. Findings are suspicious for seizure related changes and/or cerebritis. Acute ischemia cannot be excluded, particularly for the punctate foci within the high right parietal lobe. 2. 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 This finding was not definitively present on prior exams. 3. 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 4. Chronic small vessel ischemic changes within the cerebral white matter and pons. Electronically Signed: By: Jackey Loge DO On: 07/06/2019 17:57   DG Chest Portable 1 View  Result Date: 07/04/2019 CLINICAL DATA:  Chest pain EXAM: PORTABLE CHEST 1 VIEW COMPARISON:  10/04/2018, 12/13/2017 FINDINGS: Cardiomegaly. Mild diffuse coarse chronic interstitial opacity. No acute focal airspace disease or effusion. Aortic atherosclerosis. No pneumothorax. IMPRESSION: Cardiomegaly. No edema or infiltrate. Mild diffuse coarse chronic interstitial opacity Electronically Signed   By: Jasmine Pang M.D.   On: 07/04/2019 18:19   CT VENOGRAM HEAD  Result Date: 07/04/2019 CLINICAL DATA:  Syncope and weakness EXAM: CT VENOGRAM HEAD TECHNIQUE: Vena graphic images of the head  were obtained following the administration of intravenous contrast material. Multiplanar reformats and maximum intensity projections were constructed. CONTRAST:  63mL OMNIPAQUE IOHEXOL 350 MG/ML SOLN COMPARISON:  CTA head neck same day FINDINGS: Superior sagittal sinus: Normal. Straight sinus: Normal. Inferior sagittal sinus, vein of Galen and internal cerebral veins: Normal. Transverse sinuses: Normal. Sigmoid sinuses: Normal. Visualized jugular veins: Normal. IMPRESSION: Normal head CT venogram. Electronically Signed   By: Deatra Robinson M.D.   On: 07/04/2019 21:27     Assessment and plan- Patient is a 54 y.o. male with history of sickle cell beta plus thalassemia admitted for acute encephalopathy secondary to underlying seizures possibly secondary to cerebritis/ encephalitis  Patient continues to have breakthrough episodes of seizures. MRI brain was done yesterday but was not done with contrast as patient was not cooperative.  Findings were more consistent with seizure related changes and/or cerebritis/encephalitis.  Patient remains on acyclovir and broad-spectrum antibiotics..  Acute ischemia was not excluded but per my discussion with Dr. Luberta Robertson who had discussed this with  radiology that was considered less likely. I also spoke to Dr. Thad Rangereynolds and she feels acute infarct appears less likely  Neurology has seen the patient today and ID is on board as well.  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 Since there is no definitive evidence of an acute ischemic infarct and patient's outpatient course of sickle cell was fairly unremarkable except for few episodes of sickle cell crisis I do not think that the patient needs to be transferred  from a hematologic standpoint for exchange transfusion.  Patient's hemoglobin remains at his baseline between 9-10.  However if his hemoglobin drops to 8 or lower it  may be reasonable to proceed with 1 unit of PRBC transfusion.   suboxone which the patient was on as an outpatient should not result in positive urine opiate screen. So patient may have been taking other non prescription opioids. However it is unlikely to cause recurrent seizures.   Acute issues are currently neurological and decision to transfer the patient if need be would be as per neurology recommendations.    Visit Diagnosis:1 1.  Acute encephalopathy 2. Sickle cell beta thalessemia   Dr. Owens SharkArchana Tyshon Fanning, MD, MPH [facility] [phone] 07/07/2019 3:40 PM

## 2019-07-07 NOTE — Progress Notes (Signed)
Subjective: Patient with seizure activity noted on EEG yesterday.  Keppra increased to 1000mg  q 12 hours.  Despite increase patient with recurrent seizures overnight.  Patient loaded with Dilantin.  Received multiple doses of Lorazepam as well.  Per nursing no further seizures noted since starting Dilantin.     Objective: Current vital signs: BP 133/81   Pulse 98   Temp 98.4 F (36.9 C)   Resp 15   Ht 6\' 3"  (1.905 m)   Wt 102.7 kg   SpO2 92%   BMI 28.30 kg/m  Vital signs in last 24 hours: Temp:  [95.4 F (35.2 C)-98.4 F (36.9 C)] 98.4 F (36.9 C) (04/30 0700) Pulse Rate:  [93-115] 98 (04/30 0700) Resp:  [9-15] 15 (04/30 0700) BP: (101-162)/(63-108) 133/81 (04/30 0700) SpO2:  [88 %-100 %] 92 % (04/30 0700)  Intake/Output from previous day: 04/29 0701 - 04/30 0700 In: 7305.8 [I.V.:4320.1; IV Piggyback:2985.7] Out: 3150 [Urine:3150] Intake/Output this shift: No intake/output data recorded. Nutritional status:  Diet Order            Diet heart healthy/carb modified Room service appropriate? Yes; Fluid consistency: Thin  Diet effective now              Neurologic Exam: Mental Status: Lethargic.  Arouses with sternal rub but provides very little conversation.  Follows simple commands.   Cranial Nerves: II: Pupils reactive bilaterally III,IV,VI: Oculocephalic response present bilaterally. NO gaze preference V,VII: corneal reflex present bilaterally Motor: Moves all extremities weakly   Lab Results: Basic Metabolic Panel: Recent Labs  Lab 07/03/19 2216 07/03/19 2216 07/04/19 1832 07/05/19 0503 07/07/19 0237  NA 128*  --  129* 135 145  K 4.0  --  4.0 5.1 4.2  CL 93*  --  96* 105 114*  CO2 23  --  22 20* 23  GLUCOSE 443*  --  348* 348* 297*  BUN 28*  --  22* 19 21*  CREATININE 1.71*  --  1.48* 1.18 1.02  CALCIUM 8.9   < > 8.9 8.6* 8.5*   < > = values in this interval not displayed.    Liver Function Tests: Recent Labs  Lab 07/04/19 1832 07/07/19 0237   AST 27 12*  ALT 17 15  ALKPHOS 116 80  BILITOT 1.6* 1.2  PROT 8.1 6.6  ALBUMIN 3.9 2.9*   No results for input(s): LIPASE, AMYLASE in the last 168 hours. No results for input(s): AMMONIA in the last 168 hours.  CBC: Recent Labs  Lab 07/03/19 2216 07/04/19 1832 07/05/19 0743 07/07/19 0237  WBC 19.7* 22.5* 13.3* 19.0*  NEUTROABS 13.6* 17.0*  --   --   HGB 11.1* 11.6* 11.2* 10.7*  HCT 33.8* 35.0* 34.5* 32.5*  MCV 65.9* 65.8* 66.5* 66.2*  PLT 352 352 307 283    Cardiac Enzymes: No results for input(s): CKTOTAL, CKMB, CKMBINDEX, TROPONINI in the last 168 hours.  Lipid Panel: No results for input(s): CHOL, TRIG, HDL, CHOLHDL, VLDL, LDLCALC in the last 168 hours.  CBG: Recent Labs  Lab 07/06/19 0718 07/06/19 1128 07/06/19 1622 07/06/19 2101 07/07/19 0714  GLUCAP 264* 248* 248* 249* 295*    Microbiology: Results for orders placed or performed during the hospital encounter of 07/04/19  Blood culture (routine x 2)     Status: None (Preliminary result)   Collection Time: 07/04/19  8:24 PM   Specimen: BLOOD  Result Value Ref Range Status   Specimen Description BLOOD RIGHT ANTECUBITAL  Final   Special Requests  Final    BOTTLES DRAWN AEROBIC AND ANAEROBIC Blood Culture adequate volume   Culture   Final    NO GROWTH 3 DAYS Performed at [facility], [address] [place], [place] [address]    Report Status PENDING  Incomplete  Blood culture (routine x 2)     Status: None (Preliminary result)   Collection Time: 07/04/19  8:27 PM   Specimen: BLOOD  Result Value Ref Range Status   Specimen Description BLOOD LEFT ANTECUBITAL  Final   Special Requests   Final    BOTTLES DRAWN AEROBIC AND ANAEROBIC Blood Culture adequate volume   Culture   Final    NO GROWTH 3 DAYS Performed at [facility], [address] [place], [place] [address]    Report Status PENDING  Incomplete  Respiratory Panel by RT PCR (Flu A&B, Covid) - Nasopharyngeal Swab      Status: None   Collection Time: 07/04/19  9:34 PM   Specimen: Nasopharyngeal Swab  Result Value Ref Range Status   SARS Coronavirus 2 by RT PCR NEGATIVE NEGATIVE Final    Comment: (NOTE) SARS-CoV-2 target nucleic acids are NOT DETECTED. The SARS-CoV-2 RNA is generally detectable in upper respiratoy specimens during the acute phase of infection. The lowest concentration of SARS-CoV-2 viral copies this assay can detect is 131 copies/mL. A negative result does not preclude SARS-Cov-2 infection and should not be used as the sole basis for treatment or other patient management decisions. A negative result may occur with  improper specimen collection/handling, submission of specimen other than nasopharyngeal swab, presence of viral mutation(s) within the areas targeted by this assay, and inadequate number of viral copies (<131 copies/mL). A negative result must be combined with clinical observations, patient history, and epidemiological information. The expected result is Negative. Fact Sheet for Patients:  https://www.moore.com/ Fact Sheet for Healthcare Providers:  https://www.young.biz/ This test is not yet ap proved or cleared by the [place] FDA and  has been authorized for detection and/or diagnosis of SARS-CoV-2 by FDA under an Emergency Use Authorization (EUA). This EUA will remain  in effect (meaning this test can be used) for the duration of the COVID-19 declaration under Section 564(b)(1) of the Act, 21 U.S.C. section 360bbb-3(b)(1), unless the authorization is terminated or revoked sooner.    Influenza A by PCR NEGATIVE NEGATIVE Final   Influenza B by PCR NEGATIVE NEGATIVE Final    Comment: (NOTE) The Xpert Xpress SARS-CoV-2/FLU/RSV assay is intended as an aid in  the diagnosis of influenza from Nasopharyngeal swab specimens and  should not be used as a sole basis for treatment. Nasal washings and  aspirates are unacceptable for  Xpert Xpress SARS-CoV-2/FLU/RSV  testing. Fact Sheet for Patients: https://www.moore.com/ Fact Sheet for Healthcare Providers: https://www.young.biz/ This test is not yet approved or cleared by the [place] FDA and  has been authorized for detection and/or diagnosis of SARS-CoV-2 by  FDA under an Emergency Use Authorization (EUA). This EUA will remain  in effect (meaning this test can be used) for the duration of the  Covid-19 declaration under Section 564(b)(1) of the Act, 21  U.S.C. section 360bbb-3(b)(1), unless the authorization is  terminated or revoked. Performed at [facility], [address] [place], [place] [address]   MRSA PCR Screening     Status: None   Collection Time: 07/05/19  2:54 AM   Specimen: Nasal Mucosa; Nasopharyngeal  Result Value Ref Range Status   MRSA by PCR NEGATIVE NEGATIVE Final  Comment:        The GeneXpert MRSA Assay (FDA approved for NASAL specimens only), is one component of a comprehensive MRSA colonization surveillance program. It is not intended to diagnose MRSA infection nor to guide or monitor treatment for MRSA infections. Performed at [facility], [address] [place], [place] [address]   Aerobic Culture (superficial specimen)     Status: None (Preliminary result)   Collection Time: 07/06/19  8:32 PM   Specimen: Wound  Result Value Ref Range Status   Specimen Description   Final    WOUND Performed at [facility], [address] [place], [place] [address]    Special Requests   Final    NONE Performed at [facility], [address] [place], [place] [address]    Gram Stain   Final    RARE WBC PRESENT, PREDOMINANTLY PMN RARE GRAM POSITIVE COCCI IN PAIRS Performed at [facility], [address], [place], [place] [address]    Culture PENDING  Incomplete   Report Status PENDING  Incomplete    Coagulation Studies: No results for  input(s): LABPROT, INR in the last 72 hours.  Imaging: EEG  Result Date: 07/06/2019 Thana Farr, MD     07/06/2019  4:17 PM ELECTROENCEPHALOGRAM REPORT Patient: John Haas       Room #: IC13A-AA EEG No. ID: [id number] Age: 54 y.o.        Sex: male Requesting Physician: Luberta Robertson Report Date:  07/06/2019       Interpreting Physician: Thana Farr History: Tod Abrahamsen is an 54 y.o. male with new onset seizures Medications: Acyclovir, Ampicillin, Suboxone, Rocephin, Decadron, Insulin, Keppra, Flomax, Protonix Conditions of Recording:  This is a 21 channel routine scalp EEG performed with bipolar and monopolar montages arranged in accordance to the international 10/20 system of electrode placement. One channel was dedicated to EKG recording. The patient is in the poorly responsive state. Description:  The background activity is slow and poorly organized.  This slow activity consists of a low voltage delta activity that is diffusely distributed.  There is superimposed beta activity noted as well.  The patient is positioned on his left.  Despite this there does appear some intermittent further slowing over the right temporoparietal region.   Frequent sharp waves are noted in this region as well.  The patient has periods when he further turns his head to the left and extensive artifact is noted.  Patient is able to respond to questioning at these times.  Patient also has periods of build up in beta activity over the right hemisphere without change clinically.  Hyperventilation and intermittent photic stimulation were not performed. IMPRESSION: This is an abnormal electroencephalogram secondary to general background slowing with focal slowing and sharp waves intermittently noted in the right temporoparietal region.  This is consistent with the patient's history of focal left sided seizures.  Patient also with episodes of fast beta activity-some with head turning but responsiveness and some with no change  in clinical activity.  Difficult clear characterization but can not rule out focal seizure activity.  Thana Farr, MD Neurology [phone] 07/06/2019, 4:04 PM   DG Abd 1 View  Result Date: 07/06/2019 CLINICAL DATA:  Possible heavy metal poisoning EXAM: ABDOMEN - 1 VIEW COMPARISON:  None. FINDINGS: Scattered large and small bowel gas is noted. Mild retained fecal material is noted. No intraluminal radiopacities are seen. Diffuse thickening of the femoral cortex is noted bilaterally in a symmetrical fashion. This may  be related to the patient's underlying sickle cell disease or possibly underlying Paget's. No other focal abnormality is seen. IMPRESSION: Thickened cortex within the femurs in a symmetrical fashion bilaterally likely related to the patient's underlying sickle cell disease or possible Paget's disease. No definitive radiopacities are noted within the bowel to correspond with the given clinical history. Electronically Signed   By: Alcide Clever M.D.   On: 07/06/2019 16:12   MR BRAIN WO CONTRAST  Addendum Date: 07/06/2019   ADDENDUM REPORT: 07/06/2019 18:10 ADDENDUM: These results were called by telephone at the time of interpretation on 07/06/2019 at 6:10 pm to provider [facility] , who verbally acknowledged these results. Electronically Signed   By: Jackey Loge DO   On: 07/06/2019 18:10   Result Date: 07/06/2019 CLINICAL DATA:  Ophthalmoplegia; encephalopathy, seizure, abnormal neuro exam. Additional history provided: Prior history of sickle thalassemia, COPD, diabetes and hypertension, presenting with headache and aching all over, altered at times with intermittent shaking of left arm and leg with deviation head to the left while awake, findings felt to reflect partial seizure. EXAM: MRI HEAD WITHOUT CONTRAST TECHNIQUE: Multiplanar, multiecho pulse sequences of the brain and surrounding structures were obtained without intravenous contrast. COMPARISON:  CT venogram 07/04/2019,  noncontrast head CT and CT angiogram head/neck 07/04/2019 FINDINGS: Brain: The patient was unable to tolerate the full examination. As a result only axial and coronal diffusion-weighted imaging, a sagittal T1 weighted sequence, an axial T2* sequence, an axial T2 FLAIR sequence and a coronal T2 weighted sequence were obtained. There is moderate/severe motion degradation of the sagittal T1 weighted sequence and axial T2 FLAIR sequence. There is severe motion degradation of the coronal T2 weighted sequence. There is subtle cortical diffusion-weighted signal abnormality throughout much of the right parietal lobe and extending inferiorly to involve portions of the right occipital lobe and posteromedial right temporal lobe. There are a few more punctate foci of restricted diffusion within the high right parietal lobe (series 5, image 30). Evaluation is limited due to significant motion degradation, but there appears to be corresponding T2/FLAIR hyperintensity at these sites. 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 Multifocal T2/FLAIR hyperintensity within the cerebral white matter and pons is nonspecific but likely reflect sequela of chronic small vessel ischemia. There is suggestion of a thin right-sided subdural hygroma measuring approximately 5 mm in thickness greatest overlying the right frontal lobe (for instance as seen on series 8, image 16). This finding was not definitively present on prior exams. No intracranial mass is identified. No midline shift. No chronic intracranial blood products. Vascular: Poorly assessed on the acquired sequences. Skull and upper cervical spine: No focal marrow lesion is identified within the limitations of significant motion degradation. Sinuses/Orbits: The orbits and paranasal sinuses are poorly assessed due to the degree of motion degradation. IMPRESSION: 1. Cortical diffusion-weighted signal abnormality and corresponding T2/FLAIR hyperintensity  involving much of the right parietal lobe and extending inferiorly to involve portions of the right occipital lobe and posteromedial right temporal lobe. There are a few additional punctate foci of restricted diffusion within the high right parietal lobe. Findings are suspicious for seizure related changes and/or cerebritis. Acute ischemia cannot be excluded, particularly for the punctate foci within the high right parietal lobe. 2. 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 This finding was not definitively present on prior exams. 3. 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 4. Chronic small vessel ischemic changes within the cerebral white matter and pons. Electronically Signed: By:  Kellie Simmering DO On: 07/06/2019 17:57    Medications:  I have reviewed the patient's current medications. Scheduled: . buprenorphine-naloxone  3 tablet Sublingual Daily  . Chlorhexidine Gluconate Cloth  6 each Topical Daily  . dexamethasone (DECADRON) injection  10 mg Intravenous Q6H  . insulin aspart  0-15 Units Subcutaneous TID WC  . insulin aspart  0-5 Units Subcutaneous QHS  . insulin detemir  25 Units Subcutaneous BID  . mouth rinse  15 mL Mouth Rinse BID  . nicotine  21 mg Transdermal Daily  . pantoprazole  40 mg Oral Daily  . phenytoin (DILANTIN) IV  100 mg Intravenous Q8H  . tamsulosin  0.4 mg Oral Daily    Assessment/Plan: 54 year old malewith medical history significant forsickle thalassemia, COPD, diabetes and hypertension who presented to the emergency room with a complaint of headache and aching all over.In the ED altered at times and withintermittent shaking of his left arm and leg with deviation of his head to the leftwhile awake. Felt to be partial seizures and started on Keppra. Patient hypothermic and with elevated white blood cell count. Due to possibility of CNS infection patient started on broad spectrum antibiotics to include Acyclovir,  Ampicillin and Ceftriaxone. Decadron also initiated.  Today patient s/p sedation. B12, RPR, folate are unremarkable.  TSH low.  WBC count improved but remains elevated.  EEG consistent with right temporoparietal cortical irritation.  MRI personally reviewed and shows diffusion and T2 abnormalities involving the right parietal lobe.  Differential includes cerebritis and/or seizure related changes.  Although infarct is on the differential as well particularly with the punctate lesions shown I agree with radiology that this is less likely the etiology.  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   With focal seizures and focal MRI findings along with hypothermia and elevated white blood cell count, cerebritis/encephalitis is primary on differential.  Patient on Acyclovir and would continue.  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  LP would be indicated at this time.  Patient has refused when alert and appropriate.  Would need consent from another family member at this time to perform since patient now too sedated to provide consent.  In the meantime would continue broad spectrum antibiotics since no source identified.    Recommendations: 1. Continue Dilantin and Keppra at current doses.  Dilantin level today 2. Repeat EEG today to direct further seizure treatment 3. Seizure precautions 4. Echocardiogram to rule out endocarditis or other cardiac source.      LOS: 2 days   Alexis Goodell, MD Neurology [phone] 07/07/2019  9:23 AM

## 2019-07-07 NOTE — Progress Notes (Signed)
Inpatient Diabetes Program Recommendations  AACE/ADA: New Consensus Statement on Inpatient Glycemic Control (2015)  Target Ranges:  Prepandial:   less than 140 mg/dL      Peak postprandial:   less than 180 mg/dL (1-2 hours)      Critically ill patients:  140 - 180 mg/dL   Lab Results  Component Value Date   GLUCAP 295 (H) 07/07/2019   HGBA1C QNSTST 07/05/2019    Review of Glycemic Control  Results for John Haas, John Haas (MRN [id number]) as of 07/07/2019 11:03  Ref. Range 07/06/2019 07:18 07/06/2019 11:28 07/06/2019 16:22 07/06/2019 21:01 07/07/2019 07:14  Glucose-Capillary Latest Ref Range: 70 - 99 mg/dL 000 (H) 505 (H) 678 (H) 249 (H) 295 (H)    Diabetes history: DM2  Outpatient Diabetes medications: basaglar 35 units BID + novolog  Current orders for Inpatient glycemic control: Novolog 0-15 tid + 0-5 QHS + levemir 25 BID + decadron 10 mg Q6H  Inpatient Diabetes Program Recommendations:   Levemir 30 units BID  Novolog 3 units tid with meals if eats at least 50% of meal  Called lab about A1C (quantity not sufficient).  The lab said they pulled a sample from the 26th of April and it is still pending.  She will look into this and call back.    Thank you, Dulce Sellar, RN, BSN Diabetes Coordinator Inpatient Diabetes Program [phone] (team pager from 8a-5p)

## 2019-07-07 NOTE — Procedures (Signed)
ELECTROENCEPHALOGRAM REPORT   Patient: John Haas       Room #: IC13A-AA EEG No. ID: [id number] Age: 54 y.o.        Sex: male Requesting Physician: Swayze Report Date:  07/07/2019        Interpreting Physician: Thana Farr  History: John Haas is an 54 y.o. male with altered mental status and seizures  Medications:  Keppra, Dilantin, Acyclovir, Omnipen, Rocephin, Decadron, Insulin, Ativan  Conditions of Recording:  This is a 21 channel routine scalp EEG performed with bipolar and monopolar montages arranged in accordance to the international 10/20 system of electrode placement. One channel was dedicated to EKG recording.  The patient is in the altered state state.  Description:  The background activity is slow and poorly organized.  It consists of a polymorphic delta activity that is diffusely distributed.  There is superimposed fast beta activity noted as well.  There is no noted hemispheric asymmetry. There is no noted epileptiform activity.  There are no incidences of eye deviation or head turning.   Hyperventilation and intermittent photic stimulation were not performed.  IMPRESSION: This is an abnormal EEG secondary to general background slowing.  This finding may be seen with a diffuse disturbance that is etiologically nonspecific, but may include a metabolic encephalopathy, among other possibilities.  No epileptiform activity was noted.  The superimposed beta activity noted is consistent with medication effect.     Thana Farr, MD Neurology [phone] 07/07/2019, 1:57 PM

## 2019-07-07 NOTE — Progress Notes (Signed)
CSW was consulted to try to help find family contact for patient. Staff has been unable to reach sister who is the contact person listed in patient's chart.  CSW conducted chart review, found additional number for sister Hermon Zea. However both the number listed in the chart [phone]) and the additional number [phone]) were not working.   A brother, Lakshya Mcgillicuddy [phone]), was listed in chart from previous hospital encounters. CSW called and left a voicemail requesting a return call from [place].   No other family contacts were found during chart review. Called [place] [employer] to inquire if they have any additional contact information.   While on the phone with [facility], received a return call from patient's brother, Iantha Fallen. Iantha Fallen reported he is available and will be expecting a call from the Doctor. He reported he will also inform their sister that patient is at the hospital. CSW provided Main # to ICU for [facility]. Updated Dr. Gerri Lins.   Alfonso Ramus, [place] [phone]

## 2019-07-07 NOTE — Progress Notes (Addendum)
Initial Nutrition Assessment  DOCUMENTATION CODES:   Morbid obesity  INTERVENTION:   Once tube feeds initiated, recommend:  Vital HP @50ml /hr + Prostat 66ml TID via tube   Propofol: 12.32 ml/hr- provides 325kcal/day   Free water flushes 49ml q4 hours to maintain tube patency   Regimen provides 2125kcal/day, 195g/day protein 1127ml/day free water   Liquid MVI daily via tube   Recommend Juven Fruit Punch BID via tube, each serving provides 95kcal and 2.5g of protein (amino acids glutamine and arginine)  NUTRITION DIAGNOSIS:   Inadequate oral intake related to inability to eat(pt sedated and ventilated) as evidenced by NPO status.  GOAL:   Provide needs based on ASPEN/SCCM guidelines  MONITOR:   Vent status, Labs, Weight trends, Skin, I & O's  REASON FOR ASSESSMENT:   Ventilator    ASSESSMENT:   54 year old male with medical history significant for sickle cell beta thalassemia, COPD, opiod dependence, diabetes and hypertension who is admitted for suspected meningitis.  RD working remotely.  Pt sedated and ventilated. OGT in place. Per chart, pt's admit weight is significantly lower than his UBW. RD will use UBW to calculate needs.    Medications reviewed and include: dexamethasone, precedex, insulin, protonix, ampicillin, ceftriaxone, LRS @50ml /hr, propofol  Labs reviewed: BUN 21(H) Wbc 19.0(H), Hgb 10.7(L), Hct 32.5(L), MCV 66.2(L), MCH 21.8(L) B12 308, folate 10.4- 4/28 cbgs- 295, 244 x 24 hrs  Patient is currently intubated on ventilator support MV: 8.0 L/min Temp (24hrs), Avg:97.6 F (36.4 C), Min:96.3 F (35.7 C), Max:98.4 F (36.9 C)  Propofol: 12.32 ml/hr- provides 325kcal/day   MAP- >72mmHg  UOP- 05-15-1980  NUTRITION - FOCUSED PHYSICAL EXAM: Unable to perform at this time   Diet Order:   Diet Order    None     EDUCATION NEEDS:   Not appropriate for education at this time  Skin:  Skin Assessment: Reviewed RN Assessment(diabetic foot  wound)  Last BM:  pta  Height:   Ht Readings from Last 1 Encounters:  07/05/19 6\' 3"  (1.905 m)    Weight:   Wt Readings from Last 1 Encounters:  07/05/19 102.7 kg    Ideal Body Weight:  89 kg  BMI:  Body mass index is 28.3 kg/m.  Estimated Nutritional Needs:   Kcal:  1646-2096kcal/day  Protein:  >180g/day  Fluid:  >2.7L/day  07/07/19 MS, RD, LDN Please refer to [facility] for RD and/or RD on-call/weekend/after hours pager

## 2019-07-07 NOTE — Progress Notes (Signed)
Date of Admission:  07/04/2019       Subjective: Patient has been intubated today.  Sedated  Medications:  . buprenorphine-naloxone  3 tablet Sublingual Daily  . Chlorhexidine Gluconate Cloth  6 each Topical Daily  . dexamethasone (DECADRON) injection  10 mg Intravenous Q6H  . dexmedetomidine  1 mcg/kg Intravenous Once  . insulin aspart  0-15 Units Subcutaneous TID WC  . insulin aspart  0-5 Units Subcutaneous QHS  . insulin detemir  30 Units Subcutaneous BID  . mouth rinse  15 mL Mouth Rinse BID  . nicotine  21 mg Transdermal Daily  . pantoprazole  40 mg Oral Daily  . tamsulosin  0.4 mg Oral Daily    Objective: Vital signs in last 24 hours: Temp:  [96.3 F (35.7 C)-98.4 F (36.9 C)] 98.4 F (36.9 C) (04/30 0700) Pulse Rate:  [93-115] 98 (04/30 0700) Resp:  [9-15] 15 (04/30 0700) BP: (101-162)/(63-108) 133/81 (04/30 0700) SpO2:  [88 %-100 %] 92 % (04/30 0700)  PHYSICAL EXAM:  General: Intubated, sedated Lungs: Bilateral air entry Heart: S1-S2 abdomen: Soft, non-tender,not distended. Bowel sounds normal. No masses Extremities: atraumatic, no cyanosis. No edema. No clubbing Skin: No rashes or lesions. Or bruising Lymph: Cervical, supraclavicular normal. Neurologic: Cannot be examined  Lab Results Recent Labs    07/04/19 1832 07/05/19 0503 07/05/19 0743 07/07/19 0237  WBC   < >  --  13.3* 19.0*  HGB   < >  --  11.2* 10.7*  HCT   < >  --  34.5* 32.5*  NA  --  135  --  145  K  --  5.1  --  4.2  CL  --  105  --  114*  CO2  --  20*  --  23  BUN  --  19  --  21*  CREATININE  --  1.18  --  1.02   < > = values in this interval not displayed.   Liver Panel Recent Labs    07/04/19 1832 07/07/19 0237  PROT 8.1 6.6  ALBUMIN 3.9 2.9*  AST 27 12*  ALT 17 15  ALKPHOS 116 80  BILITOT 1.6* 1.2   Sedimentation Rate No results for input(s): ESRSEDRATE in the last 72 hours. C-Reactive Protein No results for input(s): CRP in the last 72  hours.  Microbiology:  Studies/Results: EEG  Result Date: 07/06/2019 Thana Farr, MD     07/06/2019  4:17 PM ELECTROENCEPHALOGRAM REPORT Patient: John Haas       Room #: IC13A-AA EEG No. ID: [id number] Age: 54 y.o.        Sex: male Requesting Physician: Luberta Robertson Report Date:  07/06/2019       Interpreting Physician: Thana Farr History: John Haas is an 54 y.o. male with new onset seizures Medications: Acyclovir, Ampicillin, Suboxone, Rocephin, Decadron, Insulin, Keppra, Flomax, Protonix Conditions of Recording:  This is a 21 channel routine scalp EEG performed with bipolar and monopolar montages arranged in accordance to the international 10/20 system of electrode placement. One channel was dedicated to EKG recording. The patient is in the poorly responsive state. Description:  The background activity is slow and poorly organized.  This slow activity consists of a low voltage delta activity that is diffusely distributed.  There is superimposed beta activity noted as well.  The patient is positioned on his left.  Despite this there does appear some intermittent further slowing over the right temporoparietal region.   Frequent sharp waves are noted in this  region as well.  The patient has periods when he further turns his head to the left and extensive artifact is noted.  Patient is able to respond to questioning at these times.  Patient also has periods of build up in beta activity over the right hemisphere without change clinically.  Hyperventilation and intermittent photic stimulation were not performed. IMPRESSION: This is an abnormal electroencephalogram secondary to general background slowing with focal slowing and sharp waves intermittently noted in the right temporoparietal region.  This is consistent with the patient's history of focal left sided seizures.  Patient also with episodes of fast beta activity-some with head turning but responsiveness and some with no change in clinical  activity.  Difficult clear characterization but can not rule out focal seizure activity.  Alexis Goodell, MD Neurology [phone] 07/06/2019, 4:04 PM   DG Abd 1 View  Result Date: 07/06/2019 CLINICAL DATA:  Possible heavy metal poisoning EXAM: ABDOMEN - 1 VIEW COMPARISON:  None. FINDINGS: Scattered large and small bowel gas is noted. Mild retained fecal material is noted. No intraluminal radiopacities are seen. Diffuse thickening of the femoral cortex is noted bilaterally in a symmetrical fashion. This may be related to the patient's underlying sickle cell disease or possibly underlying Paget's. No other focal abnormality is seen. IMPRESSION: Thickened cortex within the femurs in a symmetrical fashion bilaterally likely related to the patient's underlying sickle cell disease or possible Paget's disease. No definitive radiopacities are noted within the bowel to correspond with the given clinical history. Electronically Signed   By: Inez Catalina M.D.   On: 07/06/2019 16:12   MR BRAIN WO CONTRAST  Addendum Date: 07/06/2019   ADDENDUM REPORT: 07/06/2019 18:10 ADDENDUM: These results were called by telephone at the time of interpretation on 07/06/2019 at 6:10 pm to provider [facility] , who verbally acknowledged these results. Electronically Signed   By: Kellie Simmering DO   On: 07/06/2019 18:10   Result Date: 07/06/2019 CLINICAL DATA:  Ophthalmoplegia; encephalopathy, seizure, abnormal neuro exam. Additional history provided: Prior history of sickle thalassemia, COPD, diabetes and hypertension, presenting with headache and aching all over, altered at times with intermittent shaking of left arm and leg with deviation head to the left while awake, findings felt to reflect partial seizure. EXAM: MRI HEAD WITHOUT CONTRAST TECHNIQUE: Multiplanar, multiecho pulse sequences of the brain and surrounding structures were obtained without intravenous contrast. COMPARISON:  CT venogram 07/04/2019, noncontrast head  CT and CT angiogram head/neck 07/04/2019 FINDINGS: Brain: The patient was unable to tolerate the full examination. As a result only axial and coronal diffusion-weighted imaging, a sagittal T1 weighted sequence, an axial T2* sequence, an axial T2 FLAIR sequence and a coronal T2 weighted sequence were obtained. There is moderate/severe motion degradation of the sagittal T1 weighted sequence and axial T2 FLAIR sequence. There is severe motion degradation of the coronal T2 weighted sequence. There is subtle cortical diffusion-weighted signal abnormality throughout much of the right parietal lobe and extending inferiorly to involve portions of the right occipital lobe and posteromedial right temporal lobe. There are a few more punctate foci of restricted diffusion within the high right parietal lobe (series 5, image 30). Evaluation is limited due to significant motion degradation, but there appears to be corresponding T2/FLAIR hyperintensity at these sites. 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 Multifocal T2/FLAIR hyperintensity within the cerebral white matter and pons is nonspecific but likely reflect sequela of chronic small vessel ischemia. There is suggestion of a thin right-sided subdural hygroma measuring  approximately 5 mm in thickness greatest overlying the right frontal lobe (for instance as seen on series 8, image 16). This finding was not definitively present on prior exams. No intracranial mass is identified. No midline shift. No chronic intracranial blood products. Vascular: Poorly assessed on the acquired sequences. Skull and upper cervical spine: No focal marrow lesion is identified within the limitations of significant motion degradation. Sinuses/Orbits: The orbits and paranasal sinuses are poorly assessed due to the degree of motion degradation. IMPRESSION: 1. Cortical diffusion-weighted signal abnormality and corresponding T2/FLAIR hyperintensity involving much of  the right parietal lobe and extending inferiorly to involve portions of the right occipital lobe and posteromedial right temporal lobe. There are a few additional punctate foci of restricted diffusion within the high right parietal lobe. Findings are suspicious for seizure related changes and/or cerebritis. Acute ischemia cannot be excluded, particularly for the punctate foci within the high right parietal lobe. 2. 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 This finding was not definitively present on prior exams. 3. 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 4. Chronic small vessel ischemic changes within the cerebral white matter and pons. Electronically Signed: By: Jackey Loge DO On: 07/06/2019 17:57     Assessment/Plan: Patient intubated for airway protection.  Patient presenting with headache, falls, inability to focus his eyes with divergent gaze, high blood glucose, MRI done  without contrast suspicious for seizure related changes and/or cerebritis. Acute ischemia cannot be excluded, particularly for the punctate foci within the high right parietal lobe. MRI with contrast will be done today.  EEG shows rt tempo parietal focus Unclear etiology for his seizures.  If cerebritis  Could it be infection? ( HSV/BActerial)  Could be a an evolving cerebral abscess.  Discussed with radiologist and they do not see any evidence of an abscess or phlegmon. Clinically patient has been coherent in between episodes of seizures which makes infection less likely but still possible.  IS it infarction from sickle Beta + thalasemia Is this paraneoplastic cerebritis? Is this  a toxidrome LP done today.  LP will be helpful as the CSF will be sent for bacterial and viral PCR which can still be positive even if the patient has been on antibiotics.  Also cell count will give [place] an idea.  Until things are more clear we will continue with acyclovir, ampicillin   ceftriaxone.  Observe creatinine closely on  acyclovir  Chronic rt great toe wound Does not look overtly infected Culture sent.  Discussed the management with care team.  Discussed with neuroradiologist and interventional radiologist in person. Discussed with the sister in great detail. ID will follow him remotely this weekend.

## 2019-07-07 NOTE — Progress Notes (Signed)
2215 Patients IV lost blood return and IV team was consulted. Fluids and antibiotics stopped.  2330 Right midline is placed   0000 Fluids and antibiotics restarted   0015 Midline malpositioned IV team consulted Fluids and antibiotics stopped. On call provider made aware of loss of line.  0200 Two Peripheral lines are established after multiple attemps    0220 Fluids and antibiotics restarted.

## 2019-07-07 NOTE — Progress Notes (Signed)
PROGRESS NOTE  John Haas JJK:[id number] DOB: 12-15-1965 DOA: 07/04/2019 PCP: System, Pcp Not In  Brief History   John Haas a 54 y.o.malewith medical history significant forsickle thalassemia, COPD, diabetes and hypertension who presented to the emergency room with a complaint of headache and aching all over.He apparently went to [facility] ED yesterday but left without being seen. In the ER tonight he became belligerent at times and at times appeared confused. Also while in the emergency room he was noted to have intermittent shaking of his left arm and leg with deviation of his head to the left but he remained awake and alert during these episodes.  Patient had an extensive work-up in the emergency room that was significant for white cell count of 22,500 up from his baseline of 17,000 and he had mild hyponatremia of 129. Creatinine was 1.48 and blood sugar 348. Flu and Covid test were negative. He also had extensive imaging with CT venogram of the head, CT angio head and neck with and without contrast that showed no acute findings. The emergency room provider spoke with hematology at [facility] who did not think the findings had to do with his sickle thalassemia. He also spoke with neurology at [facility] who recommended giving Sunrise for suspected partial seizures. Patient received Keppra and Ativan in the emergency room.He was also treated empirically for possible meningitis given his continued complaints of headache and slight worsening of his chronic leukocytosis.   In the course of the last 24 hours the patient became for more agitated. He also complained of headache pain. Narcotic pain medications, anxiolytics, and antipsychotics were used to try to resolve his agitation which became so severe finally as to cause a danger to the patient and caregivers alike. The patient was placed on a precedex drip. PCCM was consulted and the patient was intubated. The family was contacted and  permission was obtained for a lumbar puncture.   Consultants  . Infectious disease . Neurology . PCCM . Interventional radiology . Hematology  Procedures  . Intubation and mechanical ventilation . Lumbar Puncture.  Antibiotics   Anti-infectives (From admission, onward)   Start     Dose/Rate Route Frequency Ordered Stop   07/07/19 1215  cefTRIAXone (ROCEPHIN) 2 g in sodium chloride 0.9 % 100 mL IVPB     2 g 200 mL/hr over 30 Minutes Intravenous Every 12 hours 07/07/19 1210     07/05/19 1000  cefTRIAXone (ROCEPHIN) 2 g in sodium chloride 0.9 % 100 mL IVPB  Status:  Discontinued     2 g 200 mL/hr over 30 Minutes Intravenous Every 12 hours 07/05/19 0051 07/06/19 1833   07/05/19 1000  vancomycin (VANCOREADY) IVPB 750 mg/150 mL  Status:  Discontinued     750 mg 150 mL/hr over 60 Minutes Intravenous Every 12 hours 07/05/19 0429 07/05/19 0858   07/05/19 1000  vancomycin (VANCOREADY) IVPB 1500 mg/300 mL  Status:  Discontinued     1,500 mg 150 mL/hr over 120 Minutes Intravenous Every 12 hours 07/05/19 0858 07/05/19 0934   07/05/19 1000  vancomycin (VANCOCIN) IVPB 1000 mg/200 mL premix  Status:  Discontinued     1,000 mg 200 mL/hr over 60 Minutes Intravenous Every 8 hours 07/05/19 0934 07/05/19 1252   07/05/19 0930  acyclovir (ZOVIRAX) 920 mg in dextrose 5 % 150 mL IVPB     920 mg 168.4 mL/hr over 60 Minutes Intravenous Every 8 hours 07/05/19 0424     07/05/19 0200  ampicillin (OMNIPEN) 2 g in sodium  chloride 0.9 % 100 mL IVPB     2 g 300 mL/hr over 20 Minutes Intravenous Every 4 hours 07/05/19 0051     07/05/19 0130  acyclovir (ZOVIRAX) 1,100 mg in dextrose 5 % 250 mL IVPB  Status:  Discontinued     1,100 mg 272 mL/hr over 60 Minutes Intravenous Every 8 hours 07/05/19 0046 07/05/19 0424   07/04/19 2100  vancomycin (VANCOREADY) IVPB 2000 mg/400 mL     2,000 mg 200 mL/hr over 120 Minutes Intravenous  Once 07/04/19 2020 07/05/19 0156   07/04/19 2030  cefTRIAXone (ROCEPHIN) 2 g in  sodium chloride 0.9 % 100 mL IVPB     2 g 200 mL/hr over 30 Minutes Intravenous  Once 07/04/19 2018 07/05/19 0126    .   Subjective  When seen the patient was thrashing about in bed. EEG tech was attempting to place leads.   Objective   Vitals:  Vitals:   07/07/19 2000 07/07/19 2100  BP: 129/82 106/77  Pulse: 87 84  Resp: (!) 21 20  Temp: 98.2 F (36.8 C)   SpO2: 100% 100%   Exam:  Constitutional:  . The patient is awake, severely agitated. He cannot be reasoned with at this time. He appears to be in extreme distress. Respiratory:  . No increased work of breathing. . No wheezes, rales, or rhonchi . No tactile fremitus Cardiovascular:  . Regular rate and rhythm . No murmurs, ectopy, or gallups. . No lateral PMI. No thrills. Abdomen:  . Abdomen is soft, non-tender, non-distended . No hernias, masses, or organomegaly . Normoactive bowel sounds.  Musculoskeletal:  . No cyanosis, clubbing, or edema Skin:  . No rashes, lesions, ulcers . palpation of skin: no induration or nodules Neurologic:  . Unable to evaluate as the patient is unable to cooperate with exam. Psychiatric:  . Agitated, confused, combative.  I have personally reviewed the following:   Today's Data  . Vitals, CMP, CBC  Micro Data  . Blood Culture x 2 No growth.  Scheduled Meds: . midazolam      . chlorhexidine gluconate (MEDLINE KIT)  15 mL Mouth Rinse BID  . Chlorhexidine Gluconate Cloth  6 each Topical Daily  . dexamethasone (DECADRON) injection  10 mg Intravenous Q6H  . dexmedetomidine  1 mcg/kg Intravenous Once  . insulin aspart  0-20 Units Subcutaneous Q4H  . mouth rinse  15 mL Mouth Rinse BID  . mouth rinse  15 mL Mouth Rinse 10 times per day  . midazolam  2 mg Intravenous Once  . pantoprazole sodium  40 mg Per Tube Daily  . senna-docusate  2 tablet Per Tube BID  . tamsulosin  0.4 mg Oral Daily   Continuous Infusions: . acyclovir 920 mg (07/07/19 1803)  . ampicillin (OMNIPEN) IV 2  g (07/07/19 1924)  . cefTRIAXone (ROCEPHIN)  IV    . dexmedetomidine (PRECEDEX) IV infusion Stopped (07/07/19 1922)  . fentaNYL infusion INTRAVENOUS 25 mcg/hr (07/07/19 1401)  . lactated ringers 50 mL/hr at 07/07/19 1330  . levETIRAcetam 1,000 mg (07/07/19 2000)  . phenytoin (DILANTIN) IV 150 mg (07/07/19 2055)  . propofol (DIPRIVAN) infusion 20 mcg/kg/min (07/07/19 1401)    Principal Problem:   Suspect Meningitis Active Problems:   Sickle thalassemia disease (HCC)   Partial seizure (HCC)   Headache   COPD with acute bronchitis (HCC)   Type 2 diabetes mellitus without complication ([place])   Acute confusion   Recurrent seizures (HCC)   Encephalopathy   LOS: 2 days  A & P  Likely focal/partial left-sided seizures: Pt now on Precedex drip. S/P LP to help rule out infectious causes and investigate other possible causes including autoimmune cerebritis or vasculidities.Patient's presentation has been confusing.  Patient appears to have left-sided seizures associated with left gaze preference and urination although patient is awake during these episodes and this is followed by a postictal state.  In between these episodes patient is awake alert and uncooperative, often aggressive and cursing.  When patient is lucid, he refuses LP and is uncooperative with further investigation.  His main concern seems to be getting his Suboxone which is ordered.  Very much appreciate ongoing input from neurology, infectious disease and hematology.  Patient underwent EEG on 07/06/2019 and seems to be having focal left-sided seizures.  MRI done on 07/06/2019 shows severe motion artifact however does also show restricted diffusion in the right parietal/occipital and temporal lobes which is consistent with edema secondary to seizure and/or cerebritis.  Ischemia is also in the differential although it is less likely. Cause/focus of seizures is unclear.   Patient was seen by Dr. Doy Mince of neurology who has increased  his Keppra dose and also has written for as needed Ativan for treatment of seizures as needed.   Infectious disease has been consulted and patient has been broadly covered for acyclovir, ceftriaxone and ampicillin. However patient has repeatedly refused LP so definitive diagnosis has not been made.Dr. Delaine Lame does not feel that infectious etiology is behind the seizures and agitation/pain.  Hematology has also been consulted but they do not feel this is likely sickle cell/cell crisis given relatively stable outpatient course to date but they continue to follow closely.  MRI also noted to have a right subdural hygroma 5 mm, no mass-effect that appears to be new.  Chronic pain/substance abuse: Continue Suboxone per home doses. There also has been some discussion that the patient had used some "street heroine". Unknown what substances he may be reacting to.  DM2: Sugars are under reasonable but not optimal control on SSI as written.  COPD: No evidence for acute flare.  Sickle cell thalassemia disease: As noted above, followed closely by Dr. Janese Banks hematology Patient does not appear to be in crisis per her evaluation  Hyponatremia: Resolved with hydration  I have seen and examined this patient myself. I have spent 48 minutes in his evaluation and care.   DVT prophylaxis: SCD Code Status: Full Family Communication: Have discussed the patient in detail with his sister and brother. All questions answered to the best of my ability. Disposition Plan:   Patient is from: Home  Anticipated Discharge Location: TBD  Barriers to Discharge: Acute ongoing seizures/mechanical ventilation/Precedex drip.  Is patient medically stable for Discharge: No   John Brooking, DO [facility] Direct contact: see [email or web address]  7PM-7AM contact night coverage as above 07/07/2019, 9:54 PM  LOS: 2 days

## 2019-07-07 NOTE — Procedures (Signed)
Central Venous Catheter Insertion Procedure Note John Haas [id number] [birth year]  Procedure: Insertion of Central Venous Catheter Indications: Assessment of intravascular volume, Drug and/or fluid administration and Frequent blood sampling  Procedure Details Consent: Unable to obtain consent because of emergent medical necessity. Time Out: Verified patient identification, verified procedure, site/side was marked, verified correct patient position, special equipment/implants available, medications/allergies/relevent history reviewed, required imaging and test results available.  Performed  Maximum sterile technique was used including antiseptics, cap, gloves, gown, hand hygiene, mask and sheet. Skin prep: Chlorhexidine; local anesthetic administered A antimicrobial bonded/coated triple lumen catheter was placed in the left internal jugular vein using the Seldinger technique.  Evaluation Blood flow good Complications: No apparent complications Patient did tolerate procedure well. Chest X-ray ordered to verify placement.  CXR: pending.    Procedure was performed using Ultrasound for direct visualization of cannulization of Left IJ.  Line was secured at the 20 mark.  BIOPACTCH applied to insertion site.    Harlon Ditty, AGACNP-BC [facility] Pager: [phone]  Judithe Modest 07/07/2019, 10:12 PM

## 2019-07-08 ENCOUNTER — Inpatient Hospital Stay: Payer: Medicare Other

## 2019-07-08 ENCOUNTER — Inpatient Hospital Stay (HOSPITAL_COMMUNITY)
Admit: 2019-07-08 | Discharge: 2019-07-08 | Disposition: A | Discharge status: Home / Self Care | Payer: Medicare Other | Attending: Pulmonary Disease | Admitting: Pulmonary Disease

## 2019-07-08 DIAGNOSIS — I350 Nonrheumatic aortic (valve) stenosis: Secondary | ICD-10-CM | POA: Diagnosis not present

## 2019-07-08 DIAGNOSIS — J209 Acute bronchitis, unspecified: Secondary | ICD-10-CM

## 2019-07-08 LAB — BASIC METABOLIC PANEL
Anion gap: 5 (ref 5–15)
BUN: 21 mg/dL — ABNORMAL HIGH (ref 6–20)
CO2: 26 mmol/L (ref 22–32)
Calcium: 8.3 mg/dL — ABNORMAL LOW (ref 8.9–10.3)
Chloride: 119 mmol/L — ABNORMAL HIGH (ref 98–111)
Creatinine, Ser: 1.08 mg/dL (ref 0.61–1.24)
GFR calc Af Amer: >60 mL/min (ref >60)
GFR calc non Af Amer: >60 mL/min (ref >60)
Glucose, Bld: 160 mg/dL — ABNORMAL HIGH (ref 70–99)
Potassium: 3.4 mmol/L — ABNORMAL LOW (ref 3.5–5.1)
Sodium: 150 mmol/L — ABNORMAL HIGH (ref 135–145)

## 2019-07-08 LAB — HEMOGLOBIN A1C
Hgb A1c MFr Bld: 12.2 % — ABNORMAL HIGH (ref 4.8–5.6)
Mean Plasma Glucose: 303 mg/dL

## 2019-07-08 LAB — CBC
HCT: 24.7 % — ABNORMAL LOW (ref 39.0–52.0)
Hemoglobin: 7.9 g/dL — ABNORMAL LOW (ref 13.0–17.0)
MCH: 21.6 pg — ABNORMAL LOW (ref 26.0–34.0)
MCHC: 32 g/dL (ref 30.0–36.0)
MCV: 67.5 fL — ABNORMAL LOW (ref 80.0–100.0)
Platelets: 181 10*3/uL (ref 150–400)
RBC: 3.66 MIL/uL — ABNORMAL LOW (ref 4.22–5.81)
RDW: 16.4 % — ABNORMAL HIGH (ref 11.5–15.5)
WBC: 19.2 10*3/uL — ABNORMAL HIGH (ref 4.0–10.5)
nRBC: 3.3 % — ABNORMAL HIGH (ref 0.0–0.2)

## 2019-07-08 LAB — URINALYSIS, COMPLETE (UACMP) WITH MICROSCOPIC
Bacteria, UA: NONE SEEN
Bilirubin Urine: NEGATIVE
Glucose, UA: 50 mg/dL — AB
Hgb urine dipstick: NEGATIVE
Ketones, ur: NEGATIVE mg/dL
Nitrite: NEGATIVE
Protein, ur: 30 mg/dL — AB
Specific Gravity, Urine: 1.02 (ref 1.005–1.030)
Squamous Epithelial / HPF: NONE SEEN (ref 0–5)
pH: 5 (ref 5.0–8.0)

## 2019-07-08 LAB — TOXOPLASMA ANTIBODIES- IGG AND  IGM
Toxoplasma Antibody- IgM: <3 AU/mL (ref 0.0–7.9)
Toxoplasma IgG Ratio: <3 IU/mL (ref 0.0–7.1)

## 2019-07-08 LAB — RETICULOCYTES
Immature Retic Fract: 20 % — ABNORMAL HIGH (ref 2.3–15.9)
RBC.: 4.45 MIL/uL (ref 4.22–5.81)
Retic Count, Absolute: 123.3 10*3/uL (ref 19.0–186.0)
Retic Ct Pct: 2.8 % (ref 0.4–3.1)

## 2019-07-08 LAB — CRYPTOCOCCUS ANTIGEN, SERUM: Cryptococcus Antigen, Serum: NEGATIVE

## 2019-07-08 LAB — HSV DNA BY PCR (REFERENCE LAB)
HSV 1 DNA: NEGATIVE
HSV 2 DNA: NEGATIVE

## 2019-07-08 LAB — ANA COMPREHENSIVE PANEL

## 2019-07-08 LAB — GLUCOSE, CAPILLARY
Glucose-Capillary: 107 mg/dL — ABNORMAL HIGH (ref 70–99)
Glucose-Capillary: 122 mg/dL — ABNORMAL HIGH (ref 70–99)
Glucose-Capillary: 126 mg/dL — ABNORMAL HIGH (ref 70–99)
Glucose-Capillary: 143 mg/dL — ABNORMAL HIGH (ref 70–99)
Glucose-Capillary: 152 mg/dL — ABNORMAL HIGH (ref 70–99)
Glucose-Capillary: 154 mg/dL — ABNORMAL HIGH (ref 70–99)
Glucose-Capillary: 218 mg/dL — ABNORMAL HIGH (ref 70–99)

## 2019-07-08 LAB — ECHOCARDIOGRAM COMPLETE
Height: 75 in
Weight: 3622.6 oz

## 2019-07-08 LAB — IRON AND TIBC
Iron: 72 ug/dL (ref 45–182)
Saturation Ratios: 36 % (ref 17.9–39.5)
TIBC: 199 ug/dL — ABNORMAL LOW (ref 250–450)
UIBC: 127 ug/dL

## 2019-07-08 LAB — PROCALCITONIN: Procalcitonin: <0.1 ng/mL

## 2019-07-08 LAB — LACTATE DEHYDROGENASE: LDH: 157 U/L (ref 98–192)

## 2019-07-08 LAB — HEPATIC FUNCTION PANEL
ALT: 11 U/L (ref 0–44)
AST: 12 U/L — ABNORMAL LOW (ref 15–41)
Albumin: 2.6 g/dL — ABNORMAL LOW (ref 3.5–5.0)
Alkaline Phosphatase: 71 U/L (ref 38–126)
Bilirubin, Direct: 0.1 mg/dL (ref 0.0–0.2)
Indirect Bilirubin: 0.8 mg/dL (ref 0.3–0.9)
Total Bilirubin: 0.9 mg/dL (ref 0.3–1.2)
Total Protein: 5.5 g/dL — ABNORMAL LOW (ref 6.5–8.1)

## 2019-07-08 LAB — MAGNESIUM: Magnesium: 2.5 mg/dL — ABNORMAL HIGH (ref 1.7–2.4)

## 2019-07-08 LAB — PHOSPHORUS: Phosphorus: 2.3 mg/dL — ABNORMAL LOW (ref 2.5–4.6)

## 2019-07-08 LAB — HEMOGLOBIN AND HEMATOCRIT, BLOOD
HCT: 30.6 % — ABNORMAL LOW (ref 39.0–52.0)
Hemoglobin: 9.7 g/dL — ABNORMAL LOW (ref 13.0–17.0)

## 2019-07-08 LAB — ANA W/REFLEX IF POSITIVE: Anti Nuclear Antibody (ANA): NEGATIVE

## 2019-07-08 MED ORDER — SODIUM CHLORIDE 0.9% IV SOLUTION: Freq: Once | INTRAVENOUS | Status: AC

## 2019-07-08 MED ORDER — FREE WATER
100.0000 mL | Status: DC
  Administered 2019-07-08 – 2019-07-09 (×6): 100 mL

## 2019-07-08 MED ORDER — NYSTATIN 100000 UNIT/ML MT SUSP
5.0000 mL | Freq: Four times a day (QID) | OROMUCOSAL | Status: DC
  Administered 2019-07-09 – 2019-07-13 (×13): 500000 [IU] via ORAL
  Filled 2019-07-08 (×21): qty 5

## 2019-07-08 MED ORDER — PERFLUTREN LIPID MICROSPHERE
1.0000 mL | INTRAVENOUS | Status: AC | PRN
  Administered 2019-07-08: 5 mL via INTRAVENOUS
  Filled 2019-07-08: qty 10

## 2019-07-08 MED ORDER — DIPHENHYDRAMINE HCL 50 MG/ML IJ SOLN: 25.0000 mg | Freq: Once | INTRAMUSCULAR | Status: DC

## 2019-07-08 MED ORDER — SODIUM CHLORIDE 0.45 % IV SOLN: INTRAVENOUS | Status: DC

## 2019-07-08 MED ORDER — THIAMINE HCL 100 MG/ML IJ SOLN
100.0000 mg | Freq: Every day | INTRAMUSCULAR | Status: DC
  Administered 2019-07-08 – 2019-07-09 (×2): 100 mg via INTRAVENOUS
  Filled 2019-07-08 (×2): qty 2

## 2019-07-08 MED ORDER — DEXAMETHASONE SODIUM PHOSPHATE 10 MG/ML IJ SOLN
10.0000 mg | Freq: Two times a day (BID) | INTRAMUSCULAR | Status: DC
  Administered 2019-07-08 – 2019-07-10 (×4): 10 mg via INTRAVENOUS
  Filled 2019-07-08 (×5): qty 1

## 2019-07-08 MED ORDER — LACTATED RINGERS IV BOLUS
1000.0000 mL | Freq: Once | INTRAVENOUS | Status: AC
  Administered 2019-07-08: 1000 mL via INTRAVENOUS

## 2019-07-08 MED ORDER — FOLIC ACID 5 MG/ML IJ SOLN
1.0000 mg | Freq: Every day | INTRAMUSCULAR | Status: DC
  Administered 2019-07-08 – 2019-07-09 (×2): 1 mg via INTRAVENOUS
  Filled 2019-07-08 (×3): qty 0.2

## 2019-07-08 MED ORDER — POTASSIUM CHLORIDE 10 MEQ/50ML IV SOLN
10.0000 meq | INTRAVENOUS | Status: AC
  Administered 2019-07-08 (×3): 10 meq via INTRAVENOUS
  Filled 2019-07-08 (×3): qty 50

## 2019-07-08 MED ORDER — SODIUM CHLORIDE 0.9% FLUSH
10.0000 mL | Freq: Two times a day (BID) | INTRAVENOUS | Status: DC
  Administered 2019-07-08 (×2): 10 mL
  Administered 2019-07-09: 40 mL
  Administered 2019-07-09 – 2019-07-12 (×7): 10 mL

## 2019-07-08 MED ORDER — ACETAMINOPHEN 325 MG PO TABS
650.0000 mg | ORAL_TABLET | Freq: Once | ORAL | Status: AC
  Administered 2019-07-08: 650 mg via ORAL
  Filled 2019-07-08: qty 2

## 2019-07-08 MED ORDER — NOREPINEPHRINE 4 MG/250ML-% IV SOLN
INTRAVENOUS | Status: AC
  Filled 2019-07-08: qty 250

## 2019-07-08 MED ORDER — NOREPINEPHRINE 16 MG/250ML-% IV SOLN
0.0000 ug/min | INTRAVENOUS | Status: DC
  Administered 2019-07-08: 07:00:00 2 ug/min via INTRAVENOUS
  Filled 2019-07-08: qty 250

## 2019-07-08 NOTE — Progress Notes (Signed)
On call provider aware of Patient  Being Hypothermic and hypotensive. Patient Still currently on Bear hugger and Levo has been started following 1 liter bolus of LR.  CVP 11.

## 2019-07-08 NOTE — Progress Notes (Addendum)
ID Chart reviewed    Intubated and sedated meds  Acyclovir Ampicillin Ceftriaxone Dexamethasone  Fentanyl Insulin keppra Levophed Dilantin propafol  Patient Vitals for the past 24 hrs:  BP Temp Temp src Pulse Resp SpO2  07/08/19 1230 (!) 95/42 -- -- (!) 103 19 92 %  07/08/19 1215 (!) 103/57 -- -- (!) 106 14 96 %  07/08/19 1200 (!) 111/58 98.4 F (36.9 C) Rectal (!) 107 17 96 %  07/08/19 1145 (!) 164/88 -- -- (!) 114 (!) 27 92 %  07/08/19 1133 -- -- -- -- -- 97 %  07/08/19 1130 100/60 -- -- (!) 106 14 97 %  07/08/19 1115 (!) 97/56 -- -- (!) 109 18 97 %  07/08/19 1100 (!) 109/53 99.7 F (37.6 C) Rectal (!) 107 15 94 %  07/08/19 1045 100/62 -- -- (!) 108 19 97 %  07/08/19 1030 (!) 104/55 -- -- (!) 106 17 97 %  07/08/19 1015 (!) 97/57 99.5 F (37.5 C) Rectal (!) 103 19 97 %  07/08/19 1000 95/62 -- -- (!) 102 15 97 %  07/08/19 0945 (!) 96/59 -- -- 95 15 96 %  07/08/19 0930 (!) 98/53 -- -- 96 18 98 %  07/08/19 0915 (!) 91/59 -- -- 96 17 98 %  07/08/19 0900 (!) 95/56 98.6 F (37 C) -- 91 20 96 %  07/08/19 0845 (!) 95/58 -- -- 95 18 98 %  07/08/19 0830 (!) 96/53 -- -- 94 15 98 %  07/08/19 0803 -- -- -- -- -- 97 %  07/08/19 0800 (!) 94/56 (!) 97.2 F (36.2 C) Rectal 86 16 97 %  07/08/19 0700 96/62 (!) 95.5 F (35.3 C) Rectal 83 19 98 %  07/08/19 0600 101/71 (!) 95.1 F (35.1 C) Rectal 78 16 100 %  07/08/19 0545 111/67 (!) 94.8 F (34.9 C) Rectal 75 19 96 %  07/08/19 0500 (!) 84/53 (!) 94.8 F (34.9 C) Rectal 76 (!) 24 98 %  07/08/19 0415 130/78 (!) 95.8 F (35.4 C) Axillary 87 15 100 %  07/08/19 0400 101/70 -- -- 77 17 100 %  07/08/19 0300 94/64 -- -- 77 18 100 %  07/08/19 0200 107/76 -- -- 72 17 100 %  07/08/19 0110 116/81 -- -- 77 17 100 %  07/08/19 0035 106/73 -- -- 79 -- 99 %  07/08/19 0000 -- 97.8 F (36.6 C) Axillary -- -- --  07/07/19 2315 100/65 -- -- -- 20 --  07/07/19 2305 91/65 -- -- 82 18 98 %  07/07/19 2200 125/76 -- -- 81 13 100 %  07/07/19 2100  106/77 -- -- 84 20 100 %  07/07/19 2000 129/82 98.2 F (36.8 C) Axillary 87 (!) 21 100 %  07/07/19 1900 126/76 -- -- 91 (!) 21 100 %  07/07/19 1700 121/82 -- -- -- -- --  07/07/19 1600 119/72 -- -- -- -- --  07/07/19 1500 118/74 -- -- -- -- --  07/07/19 1400 115/64 -- -- -- -- --  07/07/19 1300 114/81 -- -- 84 -- --     CBC Latest Ref Rng & Units 07/08/2019 07/07/2019 07/05/2019  WBC 4.0 - 10.5 K/uL 19.2(H) 19.0(H) 13.3(H)  Hemoglobin 13.0 - 17.0 g/dL 7.9(L) 10.7(L) 11.2(L)  Hematocrit 39.0 - 52.0 % 24.7(L) 32.5(L) 34.5(L)  Platelets 150 - 400 K/uL 181 283 307    CMP Latest Ref Rng & Units 07/08/2019 07/07/2019 07/05/2019  Glucose 70 - 99 mg/dL 160(H) 297(H) 348(H)  BUN 6 - 20  mg/dL 89(F) 81(O) 19  Creatinine 0.61 - 1.24 mg/dL 1.75 1.02 5.85  Sodium 135 - 145 mmol/L 150(H) 145 135  Potassium 3.5 - 5.1 mmol/L 3.4(L) 4.2 5.1  Chloride 98 - 111 mmol/L 119(H) 114(H) 105  CO2 22 - 32 mmol/L 26 23 20(L)  Calcium 8.9 - 10.3 mg/dL 8.3(L) 8.5(L) 8.6(L)  Total Protein 6.5 - 8.1 g/dL - 6.6 -  Total Bilirubin 0.3 - 1.2 mg/dL - 1.2 -  Alkaline Phos 38 - 126 U/L - 80 -  AST 15 - 41 U/L - 12(L) -  ALT 0 - 44 U/L - 15 -   Csf Glucose 188 Protein 131 WBC 9/7 N 50%, lymph 10%, mono 40% RBC 555/323 Csf culture  CSF meningitis and encephalitis panel  MRI with contrast  Cortical diffusion abnormality within the right hemisphere has resolved aside from a small area in the right occipital lobe. Areas of encephalomalacia in both frontal lobes are unchanged. There is mild multifocal white matter hyperintensity. 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 ( He had presented to [facility] [place] on 4/25 and 4/26 with rt sided headache, dizziness and MRI with contrast , MRA was done and only showed old infarcts.WBC was 22.8 from baseline of 15)  He was labeled as migraine)  No cerebral abscess LP shows only 7 cells and high protein and high glucose  ( a dissociation between cells- protein  we are broadly covering him with antibiotics for meningitis and HSV encephalitis , his clinical presentation, MRI  and csf cell count does not totally favor a bacterial meningitis, or HSV , and other etiology should be worked up investigated in order not to miss other treatable conditions. Await CSF meningitis/encephalitis PCR panel and if negative for HSV will then DC acyclovir,observe creatinine closely If it is due to sickle cell beta he will need blood transfusion especially with low Hb  Rt foot wound- chronic - saw his podiatrist on 4/16 and being treated as a superficial wound and no osteo- I saw his wound and it does not look overtly infected- this is not the cause of his presentation  and he has no bacteremia.    He was put on high dose steroids for 4 days as like being treated for pneumococcal meningitis- today is the last day- as per the guidelines . Intensivistt keeping him on  A low dose Discussed the management with Dr.Aleskerov

## 2019-07-08 NOTE — Progress Notes (Signed)
PROGRESS NOTE  John Haas OYD:[id number] DOB: 1965/07/19 DOA: 07/04/2019 PCP: System, Pcp Not In  Brief History   John Haas a 54 y.o.malewith medical history significant forsickle thalassemia, COPD, diabetes and hypertension who presented to the emergency room with a complaint of headache and aching all over.He apparently went to [facility] ED yesterday but left without being seen. In the ER tonight he became belligerent at times and at times appeared confused. Also while in the emergency room he was noted to have intermittent shaking of his left arm and leg with deviation of his head to the left but he remained awake and alert during these episodes.  Patient had an extensive work-up in the emergency room that was significant for white cell count of 22,500 up from his baseline of 17,000 and he had mild hyponatremia of 129. Creatinine was 1.48 and blood sugar 348. Flu and Covid test were negative. He also had extensive imaging with CT venogram of the head, CT angio head and neck with and without contrast that showed no acute findings. The emergency room provider spoke with hematology at [facility] who did not think the findings had to do with his sickle thalassemia. He also spoke with neurology at [facility] health who recommended giving [place] for suspected partial seizures. Patient received Keppra and Ativan in the emergency room.He was also treated empirically for possible meningitis given his continued complaints of headache and slight worsening of his chronic leukocytosis.   In the course of the last 24 hours the patient became for more agitated. He also complained of headache pain. Narcotic pain medications, anxiolytics, and antipsychotics were used to try to resolve his agitation which became so severe finally as to cause a danger to the patient and caregivers alike. The patient was placed on a precedex drip. PCCM was consulted and the patient was intubated. The family was contacted and  permission was obtained for a lumbar puncture.   LP is not strongly supportive of bacterial meningitis or HSV. Await CSF meningitis/encephalitix PCR panel. DC acyclovir if negative. The patient was transfused with one unit PRBC's this morning due to recommendation of hematology, oncology.   MRI brain was repeated. It does not show cerebritis. Possible demonstrates post ictal changes. 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  Consultants  . Infectious disease . Neurology . PCCM . Interventional radiology . Hematology  Procedures  . Intubation and mechanical ventilation . Lumbar Puncture.  Antibiotics   Anti-infectives (From admission, onward)   Start     Dose/Rate Route Frequency Ordered Stop   07/07/19 1215  cefTRIAXone (ROCEPHIN) 2 g in sodium chloride 0.9 % 100 mL IVPB     2 g 200 mL/hr over 30 Minutes Intravenous Every 12 hours 07/07/19 1210     07/05/19 1000  cefTRIAXone (ROCEPHIN) 2 g in sodium chloride 0.9 % 100 mL IVPB  Status:  Discontinued     2 g 200 mL/hr over 30 Minutes Intravenous Every 12 hours 07/05/19 0051 07/06/19 1833   07/05/19 1000  vancomycin (VANCOREADY) IVPB 750 mg/150 mL  Status:  Discontinued     750 mg 150 mL/hr over 60 Minutes Intravenous Every 12 hours 07/05/19 0429 07/05/19 0858   07/05/19 1000  vancomycin (VANCOREADY) IVPB 1500 mg/300 mL  Status:  Discontinued     1,500 mg 150 mL/hr over 120 Minutes Intravenous Every 12 hours 07/05/19 0858 07/05/19 0934   07/05/19 1000  vancomycin (VANCOCIN) IVPB 1000 mg/200 mL premix  Status:  Discontinued     1,000  mg 200 mL/hr over 60 Minutes Intravenous Every 8 hours 07/05/19 0934 07/05/19 1252   07/05/19 0930  acyclovir (ZOVIRAX) 920 mg in dextrose 5 % 150 mL IVPB     920 mg 168.4 mL/hr over 60 Minutes Intravenous Every 8 hours 07/05/19 0424     07/05/19 0200  ampicillin (OMNIPEN) 2 g in sodium chloride 0.9 % 100 mL IVPB     2 g 300 mL/hr over 20 Minutes Intravenous Every 4 hours 07/05/19  0051     07/05/19 0130  acyclovir (ZOVIRAX) 1,100 mg in dextrose 5 % 250 mL IVPB  Status:  Discontinued     1,100 mg 272 mL/hr over 60 Minutes Intravenous Every 8 hours 07/05/19 0046 07/05/19 0424   07/04/19 2100  vancomycin (VANCOREADY) IVPB 2000 mg/400 mL     2,000 mg 200 mL/hr over 120 Minutes Intravenous  Once 07/04/19 2020 07/05/19 0156   07/04/19 2030  cefTRIAXone (ROCEPHIN) 2 g in sodium chloride 0.9 % 100 mL IVPB     2 g 200 mL/hr over 30 Minutes Intravenous  Once 07/04/19 2018 07/05/19 0126      Subjective  The patient is intubated, ventilated, and sedated. Sister is at bedside.  Objective   Vitals:  Vitals:   07/08/19 1600 07/08/19 1615  BP: (!) 103/59 101/62  Pulse: 88 92  Resp: 12 13  Temp: 98.4 F (36.9 C)   SpO2: 96% 96%   Exam:  Constitutional:  . The patient is intubated, ventilated, and sedated. No acute distress. Respiratory:  . No increased work of breathing. . No wheezes, rales, or rhonchi . No tactile fremitus Cardiovascular:  . Regular rate and rhythm . No murmurs, ectopy, or gallups. . No lateral PMI. No thrills. Abdomen:  . Abdomen is soft, non-tender, non-distended . No hernias, masses, or organomegaly . Normoactive bowel sounds.  Musculoskeletal:  . No cyanosis, clubbing, or edema Skin:  . No rashes, lesions, ulcers . palpation of skin: no induration or nodules Neurologic:  . Unable to evaluate as the patient is unable to cooperate with exam. Psychiatric:  Unable to evaluate as the patient is unable to cooperate with exam.  I have personally reviewed the following:   Today's Data  . Vitals, BMP, CBC, Iron studies,   Micro Data  . Blood Culture x 2 No growth. . 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 . Echocardiogram with EF of 55 - 60%. No wall motion abnormalities, Grade I diastolic dysfunction . CXR: Enlarged heart with bilateral pulmonary  infiltrates.  Scheduled Meds: . sodium chloride   Intravenous Once  . chlorhexidine gluconate (MEDLINE KIT)  15 mL Mouth Rinse BID  . Chlorhexidine Gluconate Cloth  6 each Topical Daily  . dexamethasone (DECADRON) injection  10 mg Intravenous Q12H  . dexmedetomidine  1 mcg/kg Intravenous Once  . folic acid  1 mg Intravenous Daily  . free water  100 mL Per Tube Q4H  . insulin aspart  0-20 Units Subcutaneous Q4H  . mouth rinse  15 mL Mouth Rinse 10 times per day  . pantoprazole sodium  40 mg Per Tube Daily  . senna-docusate  2 tablet Per Tube BID  . sodium chloride flush  10-40 mL Intracatheter Q12H  . tamsulosin  0.4 mg Oral Daily  . thiamine injection  100 mg Intravenous Daily   Continuous Infusions: . sodium chloride 75 mL/hr at 07/08/19 1534  . acyclovir Stopped (07/08/19 1035)  . ampicillin (OMNIPEN) IV  Stopped (07/08/19 1436)  . cefTRIAXone (ROCEPHIN)  IV Stopped (07/08/19 1326)  . dexmedetomidine (PRECEDEX) IV infusion Stopped (07/07/19 1922)  . fentaNYL infusion INTRAVENOUS 300 mcg/hr (07/08/19 1534)  . levETIRAcetam Stopped (07/08/19 0827)  . norepinephrine (LEVOPHED) Adult infusion 3 mcg/min (07/08/19 1534)  . norepinephrine    . phenytoin (DILANTIN) IV Stopped (07/08/19 1422)  . propofol (DIPRIVAN) infusion 50 mcg/kg/min (07/08/19 1534)    Principal Problem:   Suspect Meningitis Active Problems:   Sickle thalassemia disease (HCC)   Partial seizure ([place])   Headache   COPD with acute bronchitis (HCC)   Type 2 diabetes mellitus without complication ([place])   Acute confusion   Recurrent seizures (HCC)   Encephalopathy   LOS: 3 days   A & P  Likely focal/partial left-sided seizures: Pt now on Precedex drip. S/P LP to help rule out infectious causes and investigate other possible causes including autoimmune cerebritis or vasculidities.Patient's presentation has been confusing.  Patient appears to have left-sided seizures associated with left gaze preference and  urination although patient is awake during these episodes and this is followed by a postictal state.  In between these episodes patient is awake alert and uncooperative, often aggressive and cursing.  When patient is lucid, he refuses LP and is uncooperative with further investigation.  His main concern seems to be getting his Suboxone which is ordered.   Very much appreciate ongoing input from neurology, infectious disease and hematology.  Patient underwent EEG on 07/06/2019 and seems to be having focal left-sided seizures.  MRI done on 07/06/2019 shows severe motion artifact however does also show restricted diffusion in the right parietal/occipital and temporal lobes which is consistent with edema secondary to seizure and/or cerebritis.  Ischemia is also in the differential although it is less likely. Cause/focus of seizures is unclear.   Patient was seen by Dr. Doy Mince of neurology who has increased his Keppra dose and also has written for as needed Ativan for treatment of seizures as needed.   Infectious disease has been consulted and patient has been broadly covered for acyclovir, ceftriaxone and ampicillin. However patient has repeatedly refused LP so definitive diagnosis has not been made.Dr. Delaine Lame does not feel that infectious etiology is behind the seizures and agitation/pain.  Hematology has also been consulted but they do not feel this is likely sickle cell/cell crisis given relatively stable outpatient course to date but they continue to follow closely.  MRI also noted to have a right subdural hygroma 5 mm, no mass-effect that appears to be new. No sign of cerebritis on recent MRI. Possible post ictal changes present.  Chronic pain/substance abuse: Continue Suboxone per home doses. There also has been some discussion that the patient had used some "street heroine". Unknown what substances he may be reacting to.  DM2: Sugars are under reasonable but not optimal control on SSI as  written.  COPD: No evidence for acute flare.  Sickle cell thalassemia disease: As noted above, followed closely by Dr. Janese Banks hematology Patient does not appear to be in crisis per her evaluation  Hypernatremia: More free water cauttiously.  I have seen and examined this patient myself. I have spent 44 minutes in his evaluation and care. I have discussed the patient in detail with his brother and sister. All questions answered to the best of my ability.  DVT prophylaxis: SCD Code Status: Full Family Communication: Have discussed the patient in detail with his sister and brother. All questions answered to the best of my ability. Disposition  Plan:   Patient is from: Home  Anticipated Discharge Location: TBD  Barriers to Discharge: Acute ongoing seizures/mechanical ventilation/Precedex drip.  Is patient medically stable for Discharge: No   John Ballowe, DO [facility] Direct contact: see [email or web address]  7PM-7AM contact night coverage as above 07/08/2019, 4:38 PM  LOS: 2 days

## 2019-07-08 NOTE — Progress Notes (Signed)
Subjective: Patient intubated and sedated.  No seizure activity noted.    Objective: Current vital signs: BP (!) 97/57   Pulse (!) 103   Temp 98.6 F (37 C)   Resp 19   Ht '6\' 3"'  (1.905 m)   Wt 102.7 kg   SpO2 97%   BMI 28.30 kg/m  Vital signs in last 24 hours: Temp:  [94.8 F (34.9 C)-98.6 F (37 C)] 98.6 F (37 C) (05/01 0900) Pulse Rate:  [72-103] 103 (05/01 1015) Resp:  [13-24] 19 (05/01 1015) BP: (84-130)/(53-82) 97/57 (05/01 1015) SpO2:  [96 %-100 %] 97 % (05/01 1015) FiO2 (%):  [40 %] 40 % (05/01 0803)  Intake/Output from previous day: 04/30 0701 - 05/01 0700 In: 4921.8 [I.V.:2471.3; IV Piggyback:2450.6] Out: 1000 [Urine:1000] Intake/Output this shift: Total I/O In: 263.9 [I.V.:209.3; IV Piggyback:54.6] Out: 25 [Urine:25] Nutritional status:  Diet Order    None      Neurologic Exam: Mental Status: Patient does not respond to verbal stimuli.  Does not respond to deep sternal rub.  Does not follow commands.  No verbalizations noted.  Cranial Nerves: II: patient does not respond confrontation bilaterally, pupils right 2 mm, left 2 mm,and reactive bilaterally III,IV,VI: Oculocephalic response present bilaterally. V,VII: corneal reflex reduced bilaterally  VIII: patient does not respond to verbal stimuli IX,X: gag reflex reduced, XI: trapezius strength unable to test bilaterally XII: tongue strength unable to test Motor: Extremities flaccid throughout.  No spontaneous movement noted.  No purposeful movements noted. Sensory: Does not respond to noxious stimuli in any extremity.  Lab Results: Basic Metabolic Panel: Recent Labs  Lab 07/03/19 2216 07/03/19 2216 07/04/19 1832 07/04/19 1832 07/05/19 0503 07/07/19 0237 07/08/19 0600  NA 128*  --  129*  --  135 145 150*  K 4.0  --  4.0  --  5.1 4.2 3.4*  CL 93*  --  96*  --  105 114* 119*  CO2 23  --  22  --  20* 23 26  GLUCOSE 443*  --  348*  --  348* 297* 160*  BUN 28*  --  22*  --  19 21* 21*   CREATININE 1.71*  --  1.48*  --  1.18 1.02 1.08  CALCIUM 8.9   < > 8.9   < > 8.6* 8.5* 8.3*  MG  --   --   --   --   --   --  2.5*  PHOS  --   --   --   --   --   --  2.3*   < > = values in this interval not displayed.    Liver Function Tests: Recent Labs  Lab 07/04/19 1832 07/07/19 0237  AST 27 12*  ALT 17 15  ALKPHOS 116 80  BILITOT 1.6* 1.2  PROT 8.1 6.6  ALBUMIN 3.9 2.9*   No results for input(s): LIPASE, AMYLASE in the last 168 hours. No results for input(s): AMMONIA in the last 168 hours.  CBC: Recent Labs  Lab 07/03/19 2216 07/04/19 1832 07/05/19 0743 07/07/19 0237 07/08/19 0345  WBC 19.7* 22.5* 13.3* 19.0* 19.2*  NEUTROABS 13.6* 17.0*  --   --   --   HGB 11.1* 11.6* 11.2* 10.7* 7.9*  HCT 33.8* 35.0* 34.5* 32.5* 24.7*  MCV 65.9* 65.8* 66.5* 66.2* 67.5*  PLT 352 352 307 283 181    Cardiac Enzymes: No results for input(s): CKTOTAL, CKMB, CKMBINDEX, TROPONINI in the last 168 hours.  Lipid Panel: Recent Labs  Lab 07/07/19 1435  TRIG 88    CBG: Recent Labs  Lab 07/07/19 1156 07/07/19 1944 07/08/19 0105 07/08/19 0419 07/08/19 0755  GLUCAP 244* 288* 218* 154* 126*    Microbiology: Results for orders placed or performed during the hospital encounter of 07/04/19  Blood culture (routine x 2)     Status: None (Preliminary result)   Collection Time: 07/04/19  8:24 PM   Specimen: BLOOD  Result Value Ref Range Status   Specimen Description BLOOD RIGHT ANTECUBITAL  Final   Special Requests   Final    BOTTLES DRAWN AEROBIC AND ANAEROBIC Blood Culture adequate volume   Culture   Final    NO GROWTH 4 DAYS Performed at [facility], [address] [place], [place] [address]    Report Status PENDING  Incomplete  Blood culture (routine x 2)     Status: None (Preliminary result)   Collection Time: 07/04/19  8:27 PM   Specimen: BLOOD  Result Value Ref Range Status   Specimen Description BLOOD LEFT ANTECUBITAL  Final   Special Requests   Final     BOTTLES DRAWN AEROBIC AND ANAEROBIC Blood Culture adequate volume   Culture   Final    NO GROWTH 4 DAYS Performed at [facility], [address], [place], [place] [address]    Report Status PENDING  Incomplete  Respiratory Panel by RT PCR (Flu A&B, Covid) - Nasopharyngeal Swab     Status: None   Collection Time: 07/04/19  9:34 PM   Specimen: Nasopharyngeal Swab  Result Value Ref Range Status   SARS Coronavirus 2 by RT PCR NEGATIVE NEGATIVE Final    Comment: (NOTE) SARS-CoV-2 target nucleic acids are NOT DETECTED. The SARS-CoV-2 RNA is generally detectable in upper respiratoy specimens during the acute phase of infection. The lowest concentration of SARS-CoV-2 viral copies this assay can detect is 131 copies/mL. A negative result does not preclude SARS-Cov-2 infection and should not be used as the sole basis for treatment or other patient management decisions. A negative result may occur with  improper specimen collection/handling, submission of specimen other than nasopharyngeal swab, presence of viral mutation(s) within the areas targeted by this assay, and inadequate number of viral copies (<131 copies/mL). A negative result must be combined with clinical observations, patient history, and epidemiological information. The expected result is Negative. Fact Sheet for Patients:  PinkCheek.be Fact Sheet for Healthcare Providers:  GravelBags.it This test is not yet ap proved or cleared by the [place] FDA and  has been authorized for detection and/or diagnosis of SARS-CoV-2 by FDA under an Emergency Use Authorization (EUA). This EUA will remain  in effect (meaning this test can be used) for the duration of the COVID-19 declaration under Section 564(b)(1) of the Act, 21 U.S.C. section 360bbb-3(b)(1), unless the authorization is terminated or revoked sooner.    Influenza A by PCR NEGATIVE NEGATIVE Final    Influenza B by PCR NEGATIVE NEGATIVE Final    Comment: (NOTE) The Xpert Xpress SARS-CoV-2/FLU/RSV assay is intended as an aid in  the diagnosis of influenza from Nasopharyngeal swab specimens and  should not be used as a sole basis for treatment. Nasal washings and  aspirates are unacceptable for Xpert Xpress SARS-CoV-2/FLU/RSV  testing. Fact Sheet for Patients: PinkCheek.be Fact Sheet for Healthcare Providers: GravelBags.it This test is not yet approved or cleared by the [place] FDA and  has been authorized for detection and/or diagnosis of SARS-CoV-2 by  FDA under an Emergency Use Authorization (  EUA). This EUA will remain  in effect (meaning this test can be used) for the duration of the  Covid-19 declaration under Section 564(b)(1) of the Act, 21  U.S.C. section 360bbb-3(b)(1), unless the authorization is  terminated or revoked. Performed at [facility], [address] [place], [place] [address]   MRSA PCR Screening     Status: None   Collection Time: 07/05/19  2:54 AM   Specimen: Nasal Mucosa; Nasopharyngeal  Result Value Ref Range Status   MRSA by PCR NEGATIVE NEGATIVE Final    Comment:        The GeneXpert MRSA Assay (FDA approved for NASAL specimens only), is one component of a comprehensive MRSA colonization surveillance program. It is not intended to diagnose MRSA infection nor to guide or monitor treatment for MRSA infections. Performed at [facility], [address] [place], [place] [address]   Aerobic Culture (superficial specimen)     Status: None (Preliminary result)   Collection Time: 07/06/19  8:32 PM   Specimen: Wound  Result Value Ref Range Status   Specimen Description   Final    WOUND Performed at [facility], [address] [place], [place] [address]    Special Requests   Final    NONE Performed at [facility], [address]  [place], [place] [address]    Gram Stain   Final    RARE WBC PRESENT, PREDOMINANTLY PMN RARE GRAM POSITIVE COCCI IN PAIRS    Culture   Final    CULTURE REINCUBATED FOR BETTER GROWTH Performed at [facility], [place] [address], [place], [place] [address]    Report Status PENDING  Incomplete  Anaerobic culture     Status: None (Preliminary result)   Collection Time: 07/07/19  4:26 PM   Specimen: PATH Cytology CSF; Cerebrospinal Fluid  Result Value Ref Range Status   Specimen Description   Final    CSF Performed at [facility], [address] [place], [place] [address]    Special Requests   Final    NONE Performed at [facility], [address] [place], [place] [address]    Gram Stain   Final    NO WBC SEEN NO ORGANISMS SEEN CYTOSPIN SMEAR Performed at [facility], [place] [address] [place], [place] [address]    Culture PENDING  Incomplete   Report Status PENDING  Incomplete  CSF culture     Status: None (Preliminary result)   Collection Time: 07/07/19  4:26 PM   Specimen: PATH Cytology CSF; Cerebrospinal Fluid  Result Value Ref Range Status   Specimen Description CSF  Final   Special Requests NONE  Final   Gram Stain   Final    NO ORGANISMS SEEN WBC SEEN RED BLOOD CELLS PRESENT Performed at [facility], [address], [place],  [address]    Culture PENDING  Incomplete   Report Status PENDING  Incomplete    Coagulation Studies: Recent Labs    07/07/19 1428  LABPROT 13.7  INR 1.1    Imaging: EEG  Result Date: 07/07/2019 Alexis Goodell, MD     07/07/2019  2:03 PM ELECTROENCEPHALOGRAM REPORT Patient: Raiford Noble       Room #: IC13A-AA EEG No. ID: [id number] Age: 54 y.o.        Sex: male Requesting Physician: Swayze Report Date:  07/07/2019       Interpreting Physician: Alexis Goodell History: Tavis Kring is an 54 y.o.  male with altered mental status and seizures Medications: Keppra, Dilantin, Acyclovir, Omnipen,  Rocephin, Decadron, Insulin, Ativan Conditions of Recording:  This is a 21 channel routine scalp EEG performed with bipolar and monopolar montages arranged in accordance to the international 10/20 system of electrode placement. One channel was dedicated to EKG recording. The patient is in the altered state state. Description:  The background activity is slow and poorly organized.  It consists of a polymorphic delta activity that is diffusely distributed.  There is superimposed fast beta activity noted as well.  There is no noted hemispheric asymmetry. There is no noted epileptiform activity.  There are no incidences of eye deviation or head turning.  Hyperventilation and intermittent photic stimulation were not performed. IMPRESSION: This is an abnormal EEG secondary to general background slowing.  This finding may be seen with a diffuse disturbance that is etiologically nonspecific, but may include a metabolic encephalopathy, among other possibilities.  No epileptiform activity was noted.  The superimposed beta activity noted is consistent with medication effect.  Alexis Goodell, MD Neurology [phone] 07/07/2019, 1:57 PM   EEG  Result Date: 07/06/2019 Alexis Goodell, MD     07/06/2019  4:17 PM ELECTROENCEPHALOGRAM REPORT Patient: Makail Watling       Room #: IC13A-AA EEG No. ID: [id number] Age: 54 y.o.        Sex: male Requesting Physician: Jamse Arn Report Date:  07/06/2019       Interpreting Physician: Alexis Goodell History: Tayshawn Purnell is an 54 y.o. male with new onset seizures Medications: Acyclovir, Ampicillin, Suboxone, Rocephin, Decadron, Insulin, Keppra, Flomax, Protonix Conditions of Recording:  This is a 21 channel routine scalp EEG performed with bipolar and monopolar montages arranged in accordance to the international 10/20 system of electrode placement. One channel was dedicated to EKG recording. The patient is in the poorly responsive state. Description:  The background activity is slow  and poorly organized.  This slow activity consists of a low voltage delta activity that is diffusely distributed.  There is superimposed beta activity noted as well.  The patient is positioned on his left.  Despite this there does appear some intermittent further slowing over the right temporoparietal region.   Frequent sharp waves are noted in this region as well.  The patient has periods when he further turns his head to the left and extensive artifact is noted.  Patient is able to respond to questioning at these times.  Patient also has periods of build up in beta activity over the right hemisphere without change clinically.  Hyperventilation and intermittent photic stimulation were not performed. IMPRESSION: This is an abnormal electroencephalogram secondary to general background slowing with focal slowing and sharp waves intermittently noted in the right temporoparietal region.  This is consistent with the patient's history of focal left sided seizures.  Patient also with episodes of fast beta activity-some with head turning but responsiveness and some with no change in clinical activity.  Difficult clear characterization but can not rule out focal seizure activity.  Alexis Goodell, MD Neurology [phone] 07/06/2019, 4:04 PM   DG Abd 1 View  Result Date: 07/07/2019 CLINICAL DATA:  Orogastric tube placement. EXAM: ABDOMEN - 1 VIEW COMPARISON:  Same day. FINDINGS: The bowel gas pattern is normal. Distal tip of enteric tube is seen in expected position of proximal stomach. No radio-opaque calculi or other significant radiographic abnormality are seen. IMPRESSION: Distal tip of enteric tube seen in expected position of proximal stomach. Electronically Signed   By: Sabino Dick  Jr M.D.   On: 07/07/2019 15:18   DG Abd 1 View  Result Date: 07/07/2019 CLINICAL DATA:  Orogastric tube placement. EXAM: ABDOMEN - 1 VIEW COMPARISON:  None. FINDINGS: The bowel gas pattern is normal. Distal tip of enteric tube is  seen in expected position of distal esophagus. No radio-opaque calculi or other significant radiographic abnormality are seen. IMPRESSION: Distal tip of enteric tube is seen in expected position of distal esophagus. No evidence of bowel obstruction or ileus. Electronically Signed   By: Marijo Conception M.D.   On: 07/07/2019 13:50   DG Abd 1 View  Result Date: 07/06/2019 CLINICAL DATA:  Possible heavy metal poisoning EXAM: ABDOMEN - 1 VIEW COMPARISON:  None. FINDINGS: Scattered large and small bowel gas is noted. Mild retained fecal material is noted. No intraluminal radiopacities are seen. Diffuse thickening of the femoral cortex is noted bilaterally in a symmetrical fashion. This may be related to the patient's underlying sickle cell disease or possibly underlying Paget's. No other focal abnormality is seen. IMPRESSION: Thickened cortex within the femurs in a symmetrical fashion bilaterally likely related to the patient's underlying sickle cell disease or possible Paget's disease. No definitive radiopacities are noted within the bowel to correspond with the given clinical history. Electronically Signed   By: Inez Catalina M.D.   On: 07/06/2019 16:12   MR BRAIN WO CONTRAST  Addendum Date: 07/06/2019   ADDENDUM REPORT: 07/06/2019 18:10 ADDENDUM: These results were called by telephone at the time of interpretation on 07/06/2019 at 6:10 pm to provider [facility] , who verbally acknowledged these results. Electronically Signed   By: Kellie Simmering DO   On: 07/06/2019 18:10   Result Date: 07/06/2019 CLINICAL DATA:  Ophthalmoplegia; encephalopathy, seizure, abnormal neuro exam. Additional history provided: Prior history of sickle thalassemia, COPD, diabetes and hypertension, presenting with headache and aching all over, altered at times with intermittent shaking of left arm and leg with deviation head to the left while awake, findings felt to reflect partial seizure. EXAM: MRI HEAD WITHOUT CONTRAST TECHNIQUE:  Multiplanar, multiecho pulse sequences of the brain and surrounding structures were obtained without intravenous contrast. COMPARISON:  CT venogram 07/04/2019, noncontrast head CT and CT angiogram head/neck 07/04/2019 FINDINGS: Brain: The patient was unable to tolerate the full examination. As a result only axial and coronal diffusion-weighted imaging, a sagittal T1 weighted sequence, an axial T2* sequence, an axial T2 FLAIR sequence and a coronal T2 weighted sequence were obtained. There is moderate/severe motion degradation of the sagittal T1 weighted sequence and axial T2 FLAIR sequence. There is severe motion degradation of the coronal T2 weighted sequence. There is subtle cortical diffusion-weighted signal abnormality throughout much of the right parietal lobe and extending inferiorly to involve portions of the right occipital lobe and posteromedial right temporal lobe. There are a few more punctate foci of restricted diffusion within the high right parietal lobe (series 5, image 30). Evaluation is limited due to significant motion degradation, but there appears to be corresponding T2/FLAIR hyperintensity at these sites. 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 Multifocal T2/FLAIR hyperintensity within the cerebral white matter and pons is nonspecific but likely reflect sequela of chronic small vessel ischemia. There is suggestion of a thin right-sided subdural hygroma measuring approximately 5 mm in thickness greatest overlying the right frontal lobe (for instance as seen on series 8, image 16). This finding was not definitively present on prior exams. No intracranial mass is identified. No midline shift. No chronic intracranial blood products. Vascular:  Poorly assessed on the acquired sequences. Skull and upper cervical spine: No focal marrow lesion is identified within the limitations of significant motion degradation. Sinuses/Orbits: The orbits and paranasal sinuses are  poorly assessed due to the degree of motion degradation. IMPRESSION: 1. Cortical diffusion-weighted signal abnormality and corresponding T2/FLAIR hyperintensity involving much of the right parietal lobe and extending inferiorly to involve portions of the right occipital lobe and posteromedial right temporal lobe. There are a few additional punctate foci of restricted diffusion within the high right parietal lobe. Findings are suspicious for seizure related changes and/or cerebritis. Acute ischemia cannot be excluded, particularly for the punctate foci within the high right parietal lobe. 2. 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 This finding was not definitively present on prior exams. 3. 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 4. Chronic small vessel ischemic changes within the cerebral white matter and pons. Electronically Signed: By: Kellie Simmering DO On: 07/06/2019 17:57   MR BRAIN W WO CONTRAST  Result Date: 07/08/2019 CLINICAL DATA:  Seizures. EXAM: MRI HEAD WITHOUT AND WITH CONTRAST TECHNIQUE: Multiplanar, multiecho pulse sequences of the brain and surrounding structures were obtained without and with intravenous contrast. CONTRAST:  68m GADAVIST GADOBUTROL 1 MMOL/ML IV SOLN COMPARISON:  Brain MRI 07/06/2019 FINDINGS: Brain: Cortical diffusion abnormality within the right hemisphere has resolved aside from a small area in the right occipital lobe. Areas of encephalomalacia in both frontal lobes are unchanged. There is mild multifocal white matter hyperintensity. There is an old left cerebellar infarct. No chronic microhemorrhage. Normal midline structures. 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 Skull and upper cervical spine: Normal marrow signal. Sinuses/Orbits: Negative. Other: None. IMPRESSION: 1. No acute intracranial abnormality. 2. Cortical diffusion abnormality within the right hemisphere has  resolved aside from a small area in the right occipital lobe. This may indicate postictal change or small area of subacute ischemia. 3. Unchanged areas of encephalomalacia in the frontal lobes bilaterally. Old left cerebellar infarct. Electronically Signed   By: KUlyses JarredM.D.   On: 07/08/2019 00:45   DG Chest Port 1 View  Result Date: 07/07/2019 CLINICAL DATA:  Central line placement EXAM: PORTABLE CHEST 1 VIEW COMPARISON:  Radiographs 09/19/2018 FINDINGS: Endotracheal tube terminates in the mid trachea, 4.5 cm from the carina. Left IJ approach central venous catheter tip terminates at the brachiocephalic-caval confluence. Transesophageal tube tip again seen terminating in the proximal stomach with the side port at the level of the GE junction. Telemetry leads overlie the chest. Stable cardiomegaly with central vascular congestion and diffuse mixed interstitial and patchy airspace opacity concerning for interstitial and alveolar pulmonary edema. Hazy obscuration of the right hemidiaphragm may reflect some trace effusion. No pneumothorax. No acute osseous or soft tissue abnormality. Sclerotic changes of the left humeral head likely reflect sequela of prior avascular necrosis. Additional degenerative changes in the left shoulder and spine. IMPRESSION: 1. Left IJ approach central venous catheter tip at the left brachiocephalic-caval confluence. 2. Endotracheal tube terminates in the mid trachea, 4.5 cm from the carina. 3. Transesophageal tube tip again seen terminating in the proximal stomach with the side port at the level of the GE junction. Consider advancing 3 cm to position in the gastric body. 4. Persistent features suggestive of interstitial and alveolar edema. Likely developing right pleural effusion. Electronically Signed   By: PLovena LeM.D.   On: 07/07/2019 22:32   Portable Chest x-ray  Result Date: 07/07/2019 CLINICAL DATA:  Endotracheal tube placement. EXAM: PORTABLE CHEST 1 VIEW  COMPARISON:  July 04, 2019. FINDINGS: Stable cardiomegaly with central pulmonary vascular congestion. Endotracheal and nasogastric tubes are unchanged in position. Increased bilateral interstitial densities are noted concerning for pulmonary edema. No pneumothorax or pleural effusion is noted. Bony thorax is unremarkable. IMPRESSION: Stable support apparatus. Stable cardiomegaly with central pulmonary vascular congestion. Increased bilateral interstitial densities are noted concerning for pulmonary edema. Electronically Signed   By: Marijo Conception M.D.   On: 07/07/2019 13:50   DG FL GUIDED LUMBAR PUNCTURE  Result Date: 07/07/2019 CLINICAL DATA:  Lumbar puncture. EXAM: DIAGNOSTIC LUMBAR PUNCTURE UNDER FLUOROSCOPIC GUIDANCE FLUOROSCOPY TIME:  Fluoroscopy Time:  0 minutes 24 seconds Radiation Exposure Index (if provided by the fluoroscopic device): 11.1 mGy PROCEDURE: After discussing the risks and benefits of this procedure with the patient's sister informed consent was obtained. Back was sterilely prepped and draped. 22 gauge needle was advanced into the L4-L5 space and clear CSF obtained. 8 cc obtained and sent in 4 separate tubes to the ordered labs. Needle withdrawn. Hemostasis achieved. No complications. IMPRESSION: Successful fluoroscopically directed lumbar puncture. Electronically Signed   By: Marcello Moores  Register   On: 07/07/2019 17:05    Medications:  I have reviewed the patient's current medications. Scheduled: . sodium chloride   Intravenous Once  . acetaminophen  650 mg Oral Once  . chlorhexidine gluconate (MEDLINE KIT)  15 mL Mouth Rinse BID  . Chlorhexidine Gluconate Cloth  6 each Topical Daily  . dexamethasone (DECADRON) injection  10 mg Intravenous Q12H  . dexmedetomidine  1 mcg/kg Intravenous Once  . folic acid  1 mg Intravenous Daily  . free water  100 mL Per Tube Q4H  . insulin aspart  0-20 Units Subcutaneous Q4H  . mouth rinse  15 mL Mouth Rinse 10 times per day  . pantoprazole  sodium  40 mg Per Tube Daily  . senna-docusate  2 tablet Per Tube BID  . sodium chloride flush  10-40 mL Intracatheter Q12H  . tamsulosin  0.4 mg Oral Daily  . thiamine injection  100 mg Intravenous Daily    Assessment/Plan: 54 year old malewith medical history significant forsickle thalassemia, COPD, diabetes and hypertension who presented to the emergency room with a complaint of headache and aching all over.In the ED altered at times and withintermittent shaking of his left arm and leg with deviation of his head to the leftwhile awake. Felt to be partial seizures and started on Keppra. Patient hypothermic and with elevated white blood cell count. Due to possibility of CNS infection patient started on broad spectrum antibiotics to include Acyclovir, Ampicillin and Ceftriaxone. Decadron also initiated.  EEG consistent with right temporoparietal cortical irritation.  MRI personally reviewed and shows diffusion and T2 abnormalities involving the right parietal lobe.  Differential includes cerebritis and/or seizure related changes.  LP performed and initial findings are nonspecific with elevated protein, and [id number] in the setting of current antibiotic administration.  Awaiting further results.  Although infarct is on the differential as well particularly with the punctate lesions shown I agree with radiology that this is less likely the etiology.  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   Patient now intubated and sedated.  EEG repeated prior to intubation showed no further asymmetry between the hemispheres and no further evidence of cortical irritability.  No further clinical seizure activity noted.    Recommendations: 1. Continue Dilantin and Keppra at current doses.  Dilantin level in AM 2. Seizure precautions 3. Echocardiogram to rule out endocarditis or other cardiac source of infection. 4. Case discussed at length with Dr.  Lanney Gins today.  Will continue to follow with you.       LOS: 3 days   Alexis Goodell, MD Neurology [phone] 07/08/2019  10:19 AM

## 2019-07-08 NOTE — Progress Notes (Signed)
Pt. Transported on vent to MRI and back to ICU without incident.

## 2019-07-08 NOTE — Progress Notes (Signed)
CRITICAL CARE PROGRESS NOTE    Name: John Haas MRN: [id number] DOB: 1966-01-20     LOS: 3   SUBJECTIVE FINDINGS & SIGNIFICANT EVENTS   Patient description:   John Haas is a 54 y.o. male with medical history significant for sickle thalassemia, COPD, diabetes and hypertension who presented to the emergency room with a complaint of headache and aching all over.  He apparently went to [facility] ED yesterday but left without being seen.  In the ER tonight he became belligerent at times and at times appeared confused.  Also while in the emergency room he was noted to have intermittent shaking of his left arm and leg with deviation of his head to the left but he remained awake. Patient had an extensive work-up in the emergency room that was significant for white cell count of 22,500 up from his baseline of 17,000 and he had mild hyponatremia of 129.  Creatinine was 1.48 and blood sugar 348.  Flu and Covid test were negative.  He also had extensive imaging with CT venogram of the head, CT angio head and neck with and without contrast that showed no acute findings.  The emergency room provider spoke with hematology at [facility] who did not think the findings had to do with his sickle thalassemia.  He also spoke with neurology at [facility] health who recommended giving [place] for suspected partial seizures.  Patient received Keppra and Ativan in the emergency room.  He was also treated empirically for possible meningitis given his continued complaints of headache and slight worsening of his chronic leukocytosis.   Lines / Drains: Internal jugular central line, PIV x2  Cultures / Sepsis markers: Status post lumbar puncture, blood cultures  Antibiotics: Acyclovir, ampicillin, Rocephin,   Protocols / Consultants: PCCM, neurology,  hematology/oncology, infectious disease, hospitalist service   Events: 07/07/19-patient with recurrent partial seizure activity, evaluated at bedside with neurologist, patient is at high risk of trauma to tongue with possible bleeding/aspiration into airway, currently unable to protect airway, decision to intubate emergently.  Post intubation was able to speak with his sister and discuss care plan.  She is thankful for care, questions were answered sister is agreeable with care plan.  A lumbar puncture was able to be performed after endotracheal intubation.  Patient was placed on propofol and seizure activity has resolved. 07/08/19 -no seizure activity overnight.   PAST MEDICAL HISTORY   Past Medical History:  Diagnosis Date  . COPD (chronic obstructive pulmonary disease) ([place])   . Diabetes mellitus without complication ([place])   . Heart attack ([place])   . Hypertension   . Opioid dependence ([place])    long-term narcotics use for sickle cell associated pain  . Sickle cell anemia ([facility])      SURGICAL HISTORY   Past Surgical History:  Procedure Laterality Date  . ANKLE SURGERY    . KNEE SURGERY    . PORT-A-CATH REMOVAL       FAMILY HISTORY   No family history on file.   SOCIAL HISTORY   Social History   Tobacco Use  . Smoking status: Current Every Day Smoker  . Smokeless tobacco: Never Used  Substance Use Topics  . Alcohol use: No  . Drug use: No     MEDICATIONS   Current Medication:  Current Facility-Administered Medications:  .  0.45 % sodium chloride infusion, , Intravenous, Continuous, Swayze, Ava, DO, Last Rate: 75 mL/hr at 07/08/19 1021, New Bag at 07/08/19 1021 .  0.9 %  sodium chloride  infusion (Manually program via Guardrails IV Fluids), , Intravenous, Once, Swayze, Ava, DO .  acetaminophen (TYLENOL) tablet 650 mg, 650 mg, Oral, Q6H PRN, 650 mg at 07/05/19 1940 **OR** acetaminophen (TYLENOL) suppository 650 mg, 650 mg, Rectal, Q6H PRN, Athena Masse, MD .   acetaminophen (TYLENOL) tablet 650 mg, 650 mg, Oral, Once, Swayze, Ava, DO .  acyclovir (ZOVIRAX) 920 mg in dextrose 5 % 150 mL IVPB, 920 mg, Intravenous, Q8H, Hall, Scott A, RPH, Last Rate: 168.4 mL/hr at 07/08/19 0935, 920 mg at 07/08/19 0935 .  ampicillin (OMNIPEN) 2 g in sodium chloride 0.9 % 100 mL IVPB, 2 g, Intravenous, Q4H, Athena Masse, MD, Stopped at 07/08/19 [phone]  belladonna-opium (B&O) suppository 16.2-30 mg, 1 suppository, Rectal, Q6H PRN, Swayze, Ava, DO .  cefTRIAXone (ROCEPHIN) 2 g in sodium chloride 0.9 % 100 mL IVPB, 2 g, Intravenous, Q12H, Tsosie Billing, MD, Stopped at 07/08/19 0130 .  chlorhexidine gluconate (MEDLINE KIT) (PERIDEX) 0.12 % solution 15 mL, 15 mL, Mouth Rinse, BID, Lanney Gins, Terrin Imparato, MD, 15 mL at 07/08/19 0812 .  Chlorhexidine Gluconate Cloth 2 % PADS 6 each, 6 each, Topical, Daily, Athena Masse, MD, 6 each at 07/06/19 1036 .  dexamethasone (DECADRON) injection 10 mg, 10 mg, Intravenous, Q12H, Omaria Plunk, MD .  dexmedetomidine (PRECEDEX) 400 MCG/100ML (4 mcg/mL) infusion, 0.2-0.7 mcg/kg/hr, Intravenous, Continuous, Swayze, Ava, DO, Stopped at 07/07/19 1922 .  dexmedetomidine (PRECEDEX) bolus via infusion 102.7 mcg, 1 mcg/kg, Intravenous, Once, Swayze, Ava, DO, Stopped at 07/07/19 1923 .  fentaNYL 254mg in NS 254m(1075mml) infusion-PREMIX, 0-400 mcg/hr, Intravenous, Continuous, Jonae Renshaw, MD, Last Rate: 20 mL/hr at 07/08/19 1102, 200 mcg/hr at 07/08/19 1102 .  free water 100 mL, 100 mL, Per Tube, Q4H, KeeDarel Hong NP, 100 mL at 07/08/19 0812 .  insulin aspart (novoLOG) injection 0-20 Units, 0-20 Units, Subcutaneous, Q4H, KeeDarel Hong NP, 3 Units at 07/08/19 [id number] levETIRAcetam (KEPPRA) IVPB 1000 mg/100 mL premix, 1,000 mg, Intravenous, Q12H, ReyAlexis GoodellD, Last Rate: 400 mL/hr at 07/08/19 0819, Rate Verify at 07/08/19 0819 .  MEDLINE mouth rinse, 15 mL, Mouth Rinse, 10 times per day, AleOttie GlazierD, 15 mL at  07/08/19 0925 .  midazolam (VERSED) injection 2 mg, 2 mg, Intravenous, Q15 min PRN, KeeDarel Hong NP, 2 mg at 07/08/19 0052 .  midazolam (VERSED) injection 2 mg, 2 mg, Intravenous, Q2H PRN, KeeDarel Hong NP, 2 mg at 07/08/19 0420 .  norepinephrine (LEVOPHED) 16 mg in 250m24memix infusion, 0-40 mcg/min, Intravenous, Titrated, KeenDarel HongNP, Last Rate: 3.75 mL/hr at 07/08/19 0819, 4 mcg/min at 07/08/19 0819 .  norepinephrine (LEVOPHED) 4-5 MG/250ML-% infusion SOLN, , , ,  .  ondansetron (ZOFRAN) tablet 4 mg, 4 mg, Oral, Q6H PRN **OR** ondansetron (ZOFRAN) injection 4 mg, 4 mg, Intravenous, Q6H PRN, DuncAthena Masse, 4 mg at 07/06/19 0300 .  pantoprazole sodium (PROTONIX) 40 mg/20 mL oral suspension 40 mg, 40 mg, Per Tube, Daily, AlesOttie Glazier, 40 mg at 07/08/19 0913 .  phenytoin (DILANTIN) 150 mg in sodium chloride 0.9 % 100 mL IVPB, 150 mg, Intravenous, Q8H, ReynAlexis Goodell, Stopped at 07/08/19 0557 .  propofol (DIPRIVAN) 1000 MG/100ML infusion, 5-80 mcg/kg/min, Intravenous, Titrated, Pawel Soules, MD, Last Rate: 18.49 mL/hr at 07/08/19 0923, 30 mcg/kg/min at 07/08/19 0923 .  senna-docusate (Senokot-S) tablet 2 tablet, 2 tablet, Per Tube, BID, SimpCharlett NoseH, 2 tablet at 07/08/19 0912 .  sodium chloride flush (NS)  0.9 % injection 10-40 mL, 10-40 mL, Intracatheter, PRN, Bonnell Public Tublu, MD .  sodium chloride flush (NS) 0.9 % injection 10-40 mL, 10-40 mL, Intracatheter, Q12H, Darel Hong D, NP, 10 mL at 07/08/19 0924 .  tamsulosin (FLOMAX) capsule 0.4 mg, 0.4 mg, Oral, Daily, Jamse Arn, Dewaine Oats Tublu, MD, 0.4 mg at 07/08/19 0912 .  ziprasidone (GEODON) injection 20 mg, 20 mg, Intramuscular, Q4H PRN, Swayze, Ava, DO    ALLERGIES   Ketamine, Nubain [nalbuphine hcl], and Stadol [butorphanol]    REVIEW OF SYSTEMS     Unable to obtain due to recurrent seizure activity.  PHYSICAL EXAMINATION   Vital Signs: Temp:  [94.8 F (34.9  C)-99.7 F (37.6 C)] 99.7 F (37.6 C) (05/01 1100) Pulse Rate:  [72-108] 107 (05/01 1100) Resp:  [13-24] 15 (05/01 1100) BP: (84-130)/(53-82) 109/53 (05/01 1100) SpO2:  [94 %-100 %] 94 % (05/01 1100) FiO2 (%):  [40 %] 40 % (05/01 0803)  GENERAL: Appears younger than stated age HEAD: Normocephalic, atraumatic.  EYES: Pupils equal, round, reactive to light.  No scleral icterus.  MOUTH: Moist mucosal membrane. NECK: Supple. No thyromegaly. No nodules. No JVD.  PULMONARY: Mild crackles with decreased breath sounds bilaterally CARDIOVASCULAR: S1 and S2. Regular rate and rhythm. No murmurs, rubs, or gallops.  GASTROINTESTINAL: Soft, nontender, non-distended. No masses. Positive bowel sounds. No hepatosplenomegaly.  MUSCULOSKELETAL: No swelling, clubbing, or edema.  NEUROLOGIC: Mild distress due to acute illness SKIN:intact,warm,dry   PERTINENT DATA     Infusions: . sodium chloride 75 mL/hr at 07/08/19 1021  . acyclovir 920 mg (07/08/19 0935)  . ampicillin (OMNIPEN) IV Stopped (07/08/19 0556)  . cefTRIAXone (ROCEPHIN)  IV Stopped (07/08/19 0130)  . dexmedetomidine (PRECEDEX) IV infusion Stopped (07/07/19 1922)  . fentaNYL infusion INTRAVENOUS 200 mcg/hr (07/08/19 1102)  . levETIRAcetam 400 mL/hr at 07/08/19 0819  . norepinephrine (LEVOPHED) Adult infusion 4 mcg/min (07/08/19 0819)  . norepinephrine    . phenytoin (DILANTIN) IV Stopped (07/08/19 0557)  . propofol (DIPRIVAN) infusion 30 mcg/kg/min (07/08/19 1610)   Scheduled Medications: . sodium chloride   Intravenous Once  . acetaminophen  650 mg Oral Once  . chlorhexidine gluconate (MEDLINE KIT)  15 mL Mouth Rinse BID  . Chlorhexidine Gluconate Cloth  6 each Topical Daily  . dexamethasone (DECADRON) injection  10 mg Intravenous Q12H  . dexmedetomidine  1 mcg/kg Intravenous Once  . free water  100 mL Per Tube Q4H  . insulin aspart  0-20 Units Subcutaneous Q4H  . mouth rinse  15 mL Mouth Rinse 10 times per day  .  pantoprazole sodium  40 mg Per Tube Daily  . senna-docusate  2 tablet Per Tube BID  . sodium chloride flush  10-40 mL Intracatheter Q12H  . tamsulosin  0.4 mg Oral Daily   PRN Medications: acetaminophen **OR** acetaminophen, belladonna-opium, midazolam, midazolam, ondansetron **OR** ondansetron (ZOFRAN) IV, sodium chloride flush, ziprasidone Hemodynamic parameters: CVP:  [11 mmHg] 11 mmHg Intake/Output: 04/30 0701 - 05/01 0700 In: 4921.8 [I.V.:2471.3; IV Piggyback:2450.6] Out: 1000 [Urine:1000]  Ventilator  Settings: Vent Mode: PRVC FiO2 (%):  [40 %] 40 % Set Rate:  [16 bmp] 16 bmp Vt Set:  [500 mL] 500 mL PEEP:  [5 cmH20] 5 cmH20 Plateau Pressure:  [11 cmH20-13 cmH20] 12 cmH20    LAB RESULTS:  Basic Metabolic Panel: Recent Labs  Lab 07/03/19 2216 07/03/19 2216 07/04/19 1832 07/04/19 1832 07/05/19 0503 07/05/19 0503 07/07/19 0237 07/08/19 0600  NA 128*  --  129*  --  135  --  145 150*  K 4.0   < > 4.0   < > 5.1   < > 4.2 3.4*  CL 93*  --  96*  --  105  --  114* 119*  CO2 23  --  22  --  20*  --  23 26  GLUCOSE 443*  --  348*  --  348*  --  297* 160*  BUN 28*  --  22*  --  19  --  21* 21*  CREATININE 1.71*  --  1.48*  --  1.18  --  1.02 1.08  CALCIUM 8.9  --  8.9  --  8.6*  --  8.5* 8.3*  MG  --   --   --   --   --   --   --  2.5*  PHOS  --   --   --   --   --   --   --  2.3*   < > = values in this interval not displayed.   Liver Function Tests: Recent Labs  Lab 07/04/19 1832 07/07/19 0237  AST 27 12*  ALT 17 15  ALKPHOS 116 80  BILITOT 1.6* 1.2  PROT 8.1 6.6  ALBUMIN 3.9 2.9*   No results for input(s): LIPASE, AMYLASE in the last 168 hours. No results for input(s): AMMONIA in the last 168 hours. CBC: Recent Labs  Lab 07/03/19 2216 07/04/19 1832 07/05/19 0743 07/07/19 0237 07/08/19 0345  WBC 19.7* 22.5* 13.3* 19.0* 19.2*  NEUTROABS 13.6* 17.0*  --   --   --   HGB 11.1* 11.6* 11.2* 10.7* 7.9*  HCT 33.8* 35.0* 34.5* 32.5* 24.7*  MCV 65.9* 65.8*  66.5* 66.2* 67.5*  PLT 352 352 307 283 181   Cardiac Enzymes: No results for input(s): CKTOTAL, CKMB, CKMBINDEX, TROPONINI in the last 168 hours. BNP: Invalid input(s): POCBNP CBG: Recent Labs  Lab 07/07/19 1156 07/07/19 1944 07/08/19 0105 07/08/19 0419 07/08/19 0755  GLUCAP 244* 288* 218* 154* 126*       IMAGING RESULTS:  Imaging: EEG  Result Date: 07/07/2019 Alexis Goodell, MD     07/07/2019  2:03 PM ELECTROENCEPHALOGRAM REPORT Patient: John Haas       Room #: IC13A-AA EEG No. ID: [id number] Age: 54 y.o.        Sex: male Requesting Physician: Swayze Report Date:  07/07/2019       Interpreting Physician: Alexis Goodell History: Raffaele Derise is an 54 y.o. male with altered mental status and seizures Medications: Keppra, Dilantin, Acyclovir, Omnipen, Rocephin, Decadron, Insulin, Ativan Conditions of Recording:  This is a 21 channel routine scalp EEG performed with bipolar and monopolar montages arranged in accordance to the international 10/20 system of electrode placement. One channel was dedicated to EKG recording. The patient is in the altered state state. Description:  The background activity is slow and poorly organized.  It consists of a polymorphic delta activity that is diffusely distributed.  There is superimposed fast beta activity noted as well.  There is no noted hemispheric asymmetry. There is no noted epileptiform activity.  There are no incidences of eye deviation or head turning.  Hyperventilation and intermittent photic stimulation were not performed. IMPRESSION: This is an abnormal EEG secondary to general background slowing.  This finding may be seen with a diffuse disturbance that is etiologically nonspecific, but may include a metabolic encephalopathy, among other possibilities.  No epileptiform activity was noted.  The superimposed beta activity noted is consistent with medication effect.  Alexis Goodell, MD Neurology [phone] 07/07/2019, 1:57 PM    EEG  Result Date: 07/06/2019 Alexis Goodell, MD     07/06/2019  4:17 PM ELECTROENCEPHALOGRAM REPORT Patient: Drezden Seitzinger       Room #: IC13A-AA EEG No. ID: [id number] Age: 54 y.o.        Sex: male Requesting Physician: Jamse Arn Report Date:  07/06/2019       Interpreting Physician: Alexis Goodell History: Luby Seamans is an 54 y.o. male with new onset seizures Medications: Acyclovir, Ampicillin, Suboxone, Rocephin, Decadron, Insulin, Keppra, Flomax, Protonix Conditions of Recording:  This is a 21 channel routine scalp EEG performed with bipolar and monopolar montages arranged in accordance to the international 10/20 system of electrode placement. One channel was dedicated to EKG recording. The patient is in the poorly responsive state. Description:  The background activity is slow and poorly organized.  This slow activity consists of a low voltage delta activity that is diffusely distributed.  There is superimposed beta activity noted as well.  The patient is positioned on his left.  Despite this there does appear some intermittent further slowing over the right temporoparietal region.   Frequent sharp waves are noted in this region as well.  The patient has periods when he further turns his head to the left and extensive artifact is noted.  Patient is able to respond to questioning at these times.  Patient also has periods of build up in beta activity over the right hemisphere without change clinically.  Hyperventilation and intermittent photic stimulation were not performed. IMPRESSION: This is an abnormal electroencephalogram secondary to general background slowing with focal slowing and sharp waves intermittently noted in the right temporoparietal region.  This is consistent with the patient's history of focal left sided seizures.  Patient also with episodes of fast beta activity-some with head turning but responsiveness and some with no change in clinical activity.  Difficult clear characterization  but can not rule out focal seizure activity.  Alexis Goodell, MD Neurology [phone] 07/06/2019, 4:04 PM   DG Abd 1 View  Result Date: 07/07/2019 CLINICAL DATA:  Orogastric tube placement. EXAM: ABDOMEN - 1 VIEW COMPARISON:  Same day. FINDINGS: The bowel gas pattern is normal. Distal tip of enteric tube is seen in expected position of proximal stomach. No radio-opaque calculi or other significant radiographic abnormality are seen. IMPRESSION: Distal tip of enteric tube seen in expected position of proximal stomach. Electronically Signed   By: Marijo Conception M.D.   On: 07/07/2019 15:18   DG Abd 1 View  Result Date: 07/07/2019 CLINICAL DATA:  Orogastric tube placement. EXAM: ABDOMEN - 1 VIEW COMPARISON:  None. FINDINGS: The bowel gas pattern is normal. Distal tip of enteric tube is seen in expected position of distal esophagus. No radio-opaque calculi or other significant radiographic abnormality are seen. IMPRESSION: Distal tip of enteric tube is seen in expected position of distal esophagus. No evidence of bowel obstruction or ileus. Electronically Signed   By: Marijo Conception M.D.   On: 07/07/2019 13:50   DG Abd 1 View  Result Date: 07/06/2019 CLINICAL DATA:  Possible heavy metal poisoning EXAM: ABDOMEN - 1 VIEW COMPARISON:  None. FINDINGS: Scattered large and small bowel gas is noted. Mild retained fecal material is noted. No intraluminal radiopacities are seen. Diffuse thickening of the femoral cortex is noted bilaterally in a symmetrical fashion. This may be related to the patient's underlying sickle cell disease or possibly underlying Paget's. No other focal abnormality is seen. IMPRESSION:  Thickened cortex within the femurs in a symmetrical fashion bilaterally likely related to the patient's underlying sickle cell disease or possible Paget's disease. No definitive radiopacities are noted within the bowel to correspond with the given clinical history. Electronically Signed   By: Inez Catalina  M.D.   On: 07/06/2019 16:12   MR BRAIN WO CONTRAST  Addendum Date: 07/06/2019   ADDENDUM REPORT: 07/06/2019 18:10 ADDENDUM: These results were called by telephone at the time of interpretation on 07/06/2019 at 6:10 pm to provider [facility] , who verbally acknowledged these results. Electronically Signed   By: Kellie Simmering DO   On: 07/06/2019 18:10   Result Date: 07/06/2019 CLINICAL DATA:  Ophthalmoplegia; encephalopathy, seizure, abnormal neuro exam. Additional history provided: Prior history of sickle thalassemia, COPD, diabetes and hypertension, presenting with headache and aching all over, altered at times with intermittent shaking of left arm and leg with deviation head to the left while awake, findings felt to reflect partial seizure. EXAM: MRI HEAD WITHOUT CONTRAST TECHNIQUE: Multiplanar, multiecho pulse sequences of the brain and surrounding structures were obtained without intravenous contrast. COMPARISON:  CT venogram 07/04/2019, noncontrast head CT and CT angiogram head/neck 07/04/2019 FINDINGS: Brain: The patient was unable to tolerate the full examination. As a result only axial and coronal diffusion-weighted imaging, a sagittal T1 weighted sequence, an axial T2* sequence, an axial T2 FLAIR sequence and a coronal T2 weighted sequence were obtained. There is moderate/severe motion degradation of the sagittal T1 weighted sequence and axial T2 FLAIR sequence. There is severe motion degradation of the coronal T2 weighted sequence. There is subtle cortical diffusion-weighted signal abnormality throughout much of the right parietal lobe and extending inferiorly to involve portions of the right occipital lobe and posteromedial right temporal lobe. There are a few more punctate foci of restricted diffusion within the high right parietal lobe (series 5, image 30). Evaluation is limited due to significant motion degradation, but there appears to be corresponding T2/FLAIR hyperintensity at these  sites. 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 Multifocal T2/FLAIR hyperintensity within the cerebral white matter and pons is nonspecific but likely reflect sequela of chronic small vessel ischemia. There is suggestion of a thin right-sided subdural hygroma measuring approximately 5 mm in thickness greatest overlying the right frontal lobe (for instance as seen on series 8, image 16). This finding was not definitively present on prior exams. No intracranial mass is identified. No midline shift. No chronic intracranial blood products. Vascular: Poorly assessed on the acquired sequences. Skull and upper cervical spine: No focal marrow lesion is identified within the limitations of significant motion degradation. Sinuses/Orbits: The orbits and paranasal sinuses are poorly assessed due to the degree of motion degradation. IMPRESSION: 1. Cortical diffusion-weighted signal abnormality and corresponding T2/FLAIR hyperintensity involving much of the right parietal lobe and extending inferiorly to involve portions of the right occipital lobe and posteromedial right temporal lobe. There are a few additional punctate foci of restricted diffusion within the high right parietal lobe. Findings are suspicious for seizure related changes and/or cerebritis. Acute ischemia cannot be excluded, particularly for the punctate foci within the high right parietal lobe. 2. 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 This finding was not definitively present on prior exams. 3. 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 4. Chronic small vessel ischemic changes within the cerebral white matter and pons. Electronically Signed: By: Kellie Simmering DO On: 07/06/2019 17:57   MR BRAIN W WO CONTRAST  Result Date: 07/08/2019 CLINICAL DATA:  Seizures. EXAM: MRI HEAD WITHOUT AND WITH CONTRAST TECHNIQUE: Multiplanar, multiecho pulse sequences of the brain and surrounding  structures were obtained without and with intravenous contrast. CONTRAST:  37m GADAVIST GADOBUTROL 1 MMOL/ML IV SOLN COMPARISON:  Brain MRI 07/06/2019 FINDINGS: Brain: Cortical diffusion abnormality within the right hemisphere has resolved aside from a small area in the right occipital lobe. Areas of encephalomalacia in both frontal lobes are unchanged. There is mild multifocal white matter hyperintensity. There is an old left cerebellar infarct. No chronic microhemorrhage. Normal midline structures. 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 Skull and upper cervical spine: Normal marrow signal. Sinuses/Orbits: Negative. Other: None. IMPRESSION: 1. No acute intracranial abnormality. 2. Cortical diffusion abnormality within the right hemisphere has resolved aside from a small area in the right occipital lobe. This may indicate postictal change or small area of subacute ischemia. 3. Unchanged areas of encephalomalacia in the frontal lobes bilaterally. Old left cerebellar infarct. Electronically Signed   By: KUlyses JarredM.D.   On: 07/08/2019 00:45   DG Chest Port 1 View  Result Date: 07/07/2019 CLINICAL DATA:  Central line placement EXAM: PORTABLE CHEST 1 VIEW COMPARISON:  Radiographs 09/19/2018 FINDINGS: Endotracheal tube terminates in the mid trachea, 4.5 cm from the carina. Left IJ approach central venous catheter tip terminates at the brachiocephalic-caval confluence. Transesophageal tube tip again seen terminating in the proximal stomach with the side port at the level of the GE junction. Telemetry leads overlie the chest. Stable cardiomegaly with central vascular congestion and diffuse mixed interstitial and patchy airspace opacity concerning for interstitial and alveolar pulmonary edema. Hazy obscuration of the right hemidiaphragm may reflect some trace effusion. No pneumothorax. No acute osseous or soft tissue abnormality. Sclerotic changes of the left humeral  head likely reflect sequela of prior avascular necrosis. Additional degenerative changes in the left shoulder and spine. IMPRESSION: 1. Left IJ approach central venous catheter tip at the left brachiocephalic-caval confluence. 2. Endotracheal tube terminates in the mid trachea, 4.5 cm from the carina. 3. Transesophageal tube tip again seen terminating in the proximal stomach with the side port at the level of the GE junction. Consider advancing 3 cm to position in the gastric body. 4. Persistent features suggestive of interstitial and alveolar edema. Likely developing right pleural effusion. Electronically Signed   By: PLovena LeM.D.   On: 07/07/2019 22:32   Portable Chest x-ray  Result Date: 07/07/2019 CLINICAL DATA:  Endotracheal tube placement. EXAM: PORTABLE CHEST 1 VIEW COMPARISON:  July 04, 2019. FINDINGS: Stable cardiomegaly with central pulmonary vascular congestion. Endotracheal and nasogastric tubes are unchanged in position. Increased bilateral interstitial densities are noted concerning for pulmonary edema. No pneumothorax or pleural effusion is noted. Bony thorax is unremarkable. IMPRESSION: Stable support apparatus. Stable cardiomegaly with central pulmonary vascular congestion. Increased bilateral interstitial densities are noted concerning for pulmonary edema. Electronically Signed   By: JMarijo ConceptionM.D.   On: 07/07/2019 13:50   DG FL GUIDED LUMBAR PUNCTURE  Result Date: 07/07/2019 CLINICAL DATA:  Lumbar puncture. EXAM: DIAGNOSTIC LUMBAR PUNCTURE UNDER FLUOROSCOPIC GUIDANCE FLUOROSCOPY TIME:  Fluoroscopy Time:  0 minutes 24 seconds Radiation Exposure Index (if provided by the fluoroscopic device): 11.1 mGy PROCEDURE: After discussing the risks and benefits of this procedure with the patient's sister informed consent was obtained. Back was sterilely prepped and draped. 22 gauge needle was advanced into the L4-L5 space and clear CSF obtained. 8 cc obtained and sent in 4 separate tubes  to  the ordered labs. Needle withdrawn. Hemostasis achieved. No complications. IMPRESSION: Successful fluoroscopically directed lumbar puncture. Electronically Signed   By: Marcello Moores  Register   On: 07/07/2019 17:05   _0 @ EEG  Result Date: 07/07/2019 Alexis Goodell, MD     07/07/2019  2:03 PM ELECTROENCEPHALOGRAM REPORT Patient: John Haas       Room #: IC13A-AA EEG No. ID: [id number] Age: 54 y.o.        Sex: male Requesting Physician: Swayze Report Date:  07/07/2019       Interpreting Physician: Alexis Goodell History: John Haas is an 54 y.o. male with altered mental status and seizures Medications: Keppra, Dilantin, Acyclovir, Omnipen, Rocephin, Decadron, Insulin, Ativan Conditions of Recording:  This is a 21 channel routine scalp EEG performed with bipolar and monopolar montages arranged in accordance to the international 10/20 system of electrode placement. One channel was dedicated to EKG recording. The patient is in the altered state state. Description:  The background activity is slow and poorly organized.  It consists of a polymorphic delta activity that is diffusely distributed.  There is superimposed fast beta activity noted as well.  There is no noted hemispheric asymmetry. There is no noted epileptiform activity.  There are no incidences of eye deviation or head turning.  Hyperventilation and intermittent photic stimulation were not performed. IMPRESSION: This is an abnormal EEG secondary to general background slowing.  This finding may be seen with a diffuse disturbance that is etiologically nonspecific, but may include a metabolic encephalopathy, among other possibilities.  No epileptiform activity was noted.  The superimposed beta activity noted is consistent with medication effect.  Alexis Goodell, MD Neurology [phone] 07/07/2019, 1:57 PM   DG Abd 1 View  Result Date: 07/07/2019 CLINICAL DATA:  Orogastric tube placement. EXAM: ABDOMEN - 1 VIEW COMPARISON:  Same day.  FINDINGS: The bowel gas pattern is normal. Distal tip of enteric tube is seen in expected position of proximal stomach. No radio-opaque calculi or other significant radiographic abnormality are seen. IMPRESSION: Distal tip of enteric tube seen in expected position of proximal stomach. Electronically Signed   By: Marijo Conception M.D.   On: 07/07/2019 15:18   DG Abd 1 View  Result Date: 07/07/2019 CLINICAL DATA:  Orogastric tube placement. EXAM: ABDOMEN - 1 VIEW COMPARISON:  None. FINDINGS: The bowel gas pattern is normal. Distal tip of enteric tube is seen in expected position of distal esophagus. No radio-opaque calculi or other significant radiographic abnormality are seen. IMPRESSION: Distal tip of enteric tube is seen in expected position of distal esophagus. No evidence of bowel obstruction or ileus. Electronically Signed   By: Marijo Conception M.D.   On: 07/07/2019 13:50   MR BRAIN W WO CONTRAST  Result Date: 07/08/2019 CLINICAL DATA:  Seizures. EXAM: MRI HEAD WITHOUT AND WITH CONTRAST TECHNIQUE: Multiplanar, multiecho pulse sequences of the brain and surrounding structures were obtained without and with intravenous contrast. CONTRAST:  44m GADAVIST GADOBUTROL 1 MMOL/ML IV SOLN COMPARISON:  Brain MRI 07/06/2019 FINDINGS: Brain: Cortical diffusion abnormality within the right hemisphere has resolved aside from a small area in the right occipital lobe. Areas of encephalomalacia in both frontal lobes are unchanged. There is mild multifocal white matter hyperintensity. There is an old left cerebellar infarct. No chronic microhemorrhage. Normal midline structures. 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 Skull and upper cervical spine: Normal marrow signal. Sinuses/Orbits: Negative. Other: None. IMPRESSION: 1. No acute intracranial abnormality. 2. Cortical diffusion abnormality within  the right hemisphere has resolved aside from a small area in the right  occipital lobe. This may indicate postictal change or small area of subacute ischemia. 3. Unchanged areas of encephalomalacia in the frontal lobes bilaterally. Old left cerebellar infarct. Electronically Signed   By: Ulyses Jarred M.D.   On: 07/08/2019 00:45   DG Chest Port 1 View  Result Date: 07/07/2019 CLINICAL DATA:  Central line placement EXAM: PORTABLE CHEST 1 VIEW COMPARISON:  Radiographs 09/19/2018 FINDINGS: Endotracheal tube terminates in the mid trachea, 4.5 cm from the carina. Left IJ approach central venous catheter tip terminates at the brachiocephalic-caval confluence. Transesophageal tube tip again seen terminating in the proximal stomach with the side port at the level of the GE junction. Telemetry leads overlie the chest. Stable cardiomegaly with central vascular congestion and diffuse mixed interstitial and patchy airspace opacity concerning for interstitial and alveolar pulmonary edema. Hazy obscuration of the right hemidiaphragm may reflect some trace effusion. No pneumothorax. No acute osseous or soft tissue abnormality. Sclerotic changes of the left humeral head likely reflect sequela of prior avascular necrosis. Additional degenerative changes in the left shoulder and spine. IMPRESSION: 1. Left IJ approach central venous catheter tip at the left brachiocephalic-caval confluence. 2. Endotracheal tube terminates in the mid trachea, 4.5 cm from the carina. 3. Transesophageal tube tip again seen terminating in the proximal stomach with the side port at the level of the GE junction. Consider advancing 3 cm to position in the gastric body. 4. Persistent features suggestive of interstitial and alveolar edema. Likely developing right pleural effusion. Electronically Signed   By: Lovena Le M.D.   On: 07/07/2019 22:32   Portable Chest x-ray  Result Date: 07/07/2019 CLINICAL DATA:  Endotracheal tube placement. EXAM: PORTABLE CHEST 1 VIEW COMPARISON:  July 04, 2019. FINDINGS: Stable  cardiomegaly with central pulmonary vascular congestion. Endotracheal and nasogastric tubes are unchanged in position. Increased bilateral interstitial densities are noted concerning for pulmonary edema. No pneumothorax or pleural effusion is noted. Bony thorax is unremarkable. IMPRESSION: Stable support apparatus. Stable cardiomegaly with central pulmonary vascular congestion. Increased bilateral interstitial densities are noted concerning for pulmonary edema. Electronically Signed   By: Marijo Conception M.D.   On: 07/07/2019 13:50   DG FL GUIDED LUMBAR PUNCTURE  Result Date: 07/07/2019 CLINICAL DATA:  Lumbar puncture. EXAM: DIAGNOSTIC LUMBAR PUNCTURE UNDER FLUOROSCOPIC GUIDANCE FLUOROSCOPY TIME:  Fluoroscopy Time:  0 minutes 24 seconds Radiation Exposure Index (if provided by the fluoroscopic device): 11.1 mGy PROCEDURE: After discussing the risks and benefits of this procedure with the patient's sister informed consent was obtained. Back was sterilely prepped and draped. 22 gauge needle was advanced into the L4-L5 space and clear CSF obtained. 8 cc obtained and sent in 4 separate tubes to the ordered labs. Needle withdrawn. Hemostasis achieved. No complications. IMPRESSION: Successful fluoroscopically directed lumbar puncture. Electronically Signed   By: Marcello Moores  Register   On: 07/07/2019 17:05     ASSESSMENT AND PLAN    -Multidisciplinary rounds held today  Recurrent partial seizures with status epilepticus  -Status post endotracheal intubation for airway protection -Currently on propofol for sedation with complete resolution of seizure activity -Neurology on case-appreciate input continue Dilantin and Keppra -Status post lumbar puncture CSF studies in progress -Infectious disease on case continue with antimicrobials as per ID -MRI brain repeat with -resolution of previous diffusion signal -possible PRES related as this is known to recur with SCD and occurs with headache, visual disturbances and  seizure activity all of which  were present on admission.   Encephalopathy  -Present on admission -Possibly due to illicit drug use as patient has history of cocaine and heroin abuse as well as numerous admissions for severe opiate dependence.  Patient admits to active illicit drug use. -Additional etiologies include inflammatory, vaso-occlusive due to underlying sickle cell/thalassemia, infectious, vasospastic, seizure related. -Multiple consultants on case appreciate collaboration with such a complicated case -Additional testing is in progress ICU telemetry monitoring -Decreasing dexamethasone to 10 mg twice daily from 10 mg 4 times daily -CSF profile with atypical profile and elevated protein potentially viral related versus inflammatory/autoimmune, currently on acyclovir discussed with Dr. Steva Ready planning to DC Zovirax, oligoclonal bands and bio fire testing in process   Sickle cell beta thalassemia   -Hematology/oncology on case-appreciate input   -Monitor hemoglobin if less than 8 transfuse 1 unit PRBC  -No indication for Plex at this time  - there has been drop in Hb overnight, will obtain US spleen and testing for hemolysis   Right foot infected diabetic ulcer  -Status post culture with GPC positive -ID on case continue antimicrobials as per ID -Patient had [facility] foot [facility] without osteo-February 2021   ID -continue IV abx as prescibed -follow up cultures  GI/Nutrition GI PROPHYLAXIS as indicated DIET-->TF's as tolerated Constipation protocol as indicated  ENDO - ICU hypoglycemic\Hyperglycemia protocol -check FSBS per protocol   ELECTROLYTES -follow labs as needed -replace as needed -pharmacy consultation   DVT/GI PRX ordered -SCDs  TRANSFUSIONS AS NEEDED MONITOR FSBS ASSESS the need for LABS as needed   Critical care provider statement:    Critical care time (minutes):  109   Critical care time was exclusive of:  Separately billable procedures and  treating other patients   Critical care was necessary to treat or prevent imminent or life-threatening deterioration of the following conditions:   Status epilepticus, partial seizures, sickle cell anemia, thalassemia, opioid dependence, cerebritis, encephalopathy, history of illicit drug use, diabetic foot ulcers, neuropathy, multiple comorbid conditions.   Critical care was time spent personally by me on the following activities:  Development of treatment plan with patient or surrogate, discussions with consultants, evaluation of patient's response to treatment, examination of patient, obtaining history from patient or surrogate, ordering and performing treatments and interventions, ordering and review of laboratory studies and re-evaluation of patient's condition.  I assumed direction of critical care for this patient from another provider in my specialty: no    This document was prepared using Dragon voice recognition software and may include unintentional dictation errors.    Ottie Glazier, M.D.  [facility] [place]

## 2019-07-08 NOTE — Progress Notes (Signed)
*  PRELIMINARY RESULTS* Echocardiogram 2D Echocardiogram has been performed. Definity IV Contrast used on this study. John Haas [name] 07/08/2019, 11:47 AM

## 2019-07-09 LAB — PHENYTOIN LEVEL, TOTAL: Phenytoin Lvl: 6.8 ug/mL — ABNORMAL LOW (ref 10.0–20.0)

## 2019-07-09 LAB — PROCALCITONIN: Procalcitonin: <0.1 ng/mL

## 2019-07-09 LAB — BASIC METABOLIC PANEL
Anion gap: 5 (ref 5–15)
BUN: 17 mg/dL (ref 6–20)
CO2: 25 mmol/L (ref 22–32)
Calcium: 8 mg/dL — ABNORMAL LOW (ref 8.9–10.3)
Chloride: 119 mmol/L — ABNORMAL HIGH (ref 98–111)
Creatinine, Ser: 1.25 mg/dL — ABNORMAL HIGH (ref 0.61–1.24)
GFR calc Af Amer: >60 mL/min (ref >60)
GFR calc non Af Amer: >60 mL/min (ref >60)
Glucose, Bld: 224 mg/dL — ABNORMAL HIGH (ref 70–99)
Potassium: 4.3 mmol/L (ref 3.5–5.1)
Sodium: 149 mmol/L — ABNORMAL HIGH (ref 135–145)

## 2019-07-09 LAB — CULTURE, BLOOD (ROUTINE X 2)
Culture: NO GROWTH
Culture: NO GROWTH
Special Requests: ADEQUATE
Special Requests: ADEQUATE

## 2019-07-09 LAB — URINE CULTURE: Culture: NO GROWTH

## 2019-07-09 LAB — CBC
HCT: 33.5 % — ABNORMAL LOW (ref 39.0–52.0)
Hemoglobin: 10.7 g/dL — ABNORMAL LOW (ref 13.0–17.0)
MCH: 22.2 pg — ABNORMAL LOW (ref 26.0–34.0)
MCHC: 31.9 g/dL (ref 30.0–36.0)
MCV: 69.5 fL — ABNORMAL LOW (ref 80.0–100.0)
Platelets: 200 10*3/uL (ref 150–400)
RBC: 4.82 MIL/uL (ref 4.22–5.81)
RDW: 18.2 % — ABNORMAL HIGH (ref 11.5–15.5)
WBC: 18.6 10*3/uL — ABNORMAL HIGH (ref 4.0–10.5)
nRBC: 3.7 % — ABNORMAL HIGH (ref 0.0–0.2)

## 2019-07-09 LAB — GLUCOSE, CAPILLARY
Glucose-Capillary: 223 mg/dL — ABNORMAL HIGH (ref 70–99)
Glucose-Capillary: 204 mg/dL — ABNORMAL HIGH (ref 70–99)
Glucose-Capillary: 151 mg/dL — ABNORMAL HIGH (ref 70–99)
Glucose-Capillary: 120 mg/dL — ABNORMAL HIGH (ref 70–99)
Glucose-Capillary: 146 mg/dL — ABNORMAL HIGH (ref 70–99)
Glucose-Capillary: 157 mg/dL — ABNORMAL HIGH (ref 70–99)

## 2019-07-09 MED ORDER — FREE WATER
200.0000 mL | Status: DC
  Administered 2019-07-09 – 2019-07-10 (×7): 200 mL

## 2019-07-09 NOTE — Progress Notes (Signed)
Pharmacy Electrolyte Monitoring Consult:  Pharmacy consulted to assist in monitoring and replacing electrolytes in this 54 y.o. male admitted on 07/04/2019 with Fall, Headache, Eye Problem, and Hyperglycemia   Labs:  Sodium (mmol/L)  Date Value  07/09/2019 149 (H)   Potassium (mmol/L)  Date Value  07/09/2019 4.3   Magnesium (mg/dL)  Date Value  47/65/4650 2.5 (H)   Phosphorus (mg/dL)  Date Value  35/46/5681 2.3 (L)   Calcium (mg/dL)  Date Value  27/51/7001 8.0 (L)   Albumin (g/dL)  Date Value  74/94/4967 2.6 (L)    Assessment/Plan: 1. Electrolytes: Patient on 1/2NS at 75 ml/hr. Free water flushes increased for hypernatremia. No further replacement warranted. Will obtain all electrolytes with am labs. Will replace to maintain potassium ~ 4 and goal magnesium ~ 2.   2. Glucose: Dexamethasone 10 mg q6h > q12h. Levemir previously discontinued. Patient on SSI. Follow glucose trend with reduction in steroids. Will adjust as clinically indicated.   3. Constipation: regimen includes fentanyl infusion. Tube feeds not being initiated at this time. Patient on senna/docusate 2 tabs VT BID.   Pharmacy will continue to monitor and adjust per consult.    Pricilla Riffle, PharmD 07/09/2019 9:35 AM

## 2019-07-09 NOTE — Progress Notes (Signed)
Hematology/Oncology Consult note [facility]  Telephone:([phone] [phone]  Patient Care Team: System, Pcp Not In as PCP - General   Name of the patient: John Haas  [id number]  [birth year]   Date of visit: 07/09/2019   Interval history-patient is intubated today with plans of extubation in a day or 2. He is currently off sedation and moans but not awake and coherent. Does not respond meaningfully to verbal commands    Review of systems- Review of Systems  Unable to perform ROS: Mental acuity      Allergies  Allergen Reactions  . Ketamine Hives  . Nubain [Nalbuphine Hcl] Hives  . Stadol [Butorphanol] Hives     Past Medical History:  Diagnosis Date  . COPD (chronic obstructive pulmonary disease) ([place])   . Diabetes mellitus without complication ([place])   . Heart attack ([place])   . Hypertension   . 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 Sickle cell anemia ([facility])      Past Surgical History:  Procedure Laterality Date  . ANKLE SURGERY    . KNEE SURGERY    . PORT-A-CATH REMOVAL      Social History   Socioeconomic History  . Marital status: Single    Spouse name: Not on file  . Number of children: Not on file  . Years of education: Not on file  . Highest education level: Not on file  Occupational History  . Not on file  Tobacco Use  . Smoking status: Current Every Day Smoker  . Smokeless tobacco: Never Used  Substance and Sexual Activity  . Alcohol use: No  . Drug use: No  . Sexual activity: Not on file  Other Topics Concern  . Not on file  Social History Narrative  . Not on file   Social Determinants of Health   Financial Resource Strain:   . Difficulty of Paying Living Expenses:   Food Insecurity:   . Worried About Charity fundraiser in the Last Year:   . Arboriculturist in the Last Year:   Transportation Needs:   . Film/video editor (Medical):   Marland Kitchen Lack  of Transportation (Non-Medical):   Physical Activity:   . Days of Exercise per Week:   . Minutes of Exercise per Session:   Stress:   . Feeling of Stress :   Social Connections:   . Frequency of Communication with Friends and Family:   . Frequency of Social Gatherings with Friends and Family:   . Attends Religious Services:   . Active Member of Clubs or Organizations:   . Attends Archivist Meetings:   Marland Kitchen Marital Status:   Intimate Partner Violence:   . Fear of Current or Ex-Partner:   . Emotionally Abused:   Marland Kitchen Physically Abused:   . Sexually Abused:     No family history on file.   Current Facility-Administered Medications:  .  0.45 % sodium chloride infusion, , Intravenous, Continuous, Swayze, Ava, DO, Last Rate: 75 mL/hr at 07/09/19 0953, Rate Verify at 07/09/19 0953 .  acetaminophen (TYLENOL) tablet 650 mg, 650 mg, Oral, Q6H PRN, 650 mg at 07/05/19 1940 **OR** acetaminophen (TYLENOL) suppository 650 mg, 650 mg, Rectal, Q6H PRN, Judd Gaudier V, MD .  ampicillin (OMNIPEN) 2 g in sodium chloride 0.9 % 100 mL IVPB, 2 g, Intravenous, Q4H, Athena Masse, MD, Last Rate: 300 mL/hr at 07/09/19 0511, 2 g  at 07/09/19 0511 .  belladonna-opium (B&O) suppository 16.2-30 mg, 1 suppository, Rectal, Q6H PRN, Swayze, Ava, DO .  cefTRIAXone (ROCEPHIN) 2 g in sodium chloride 0.9 % 100 mL IVPB, 2 g, Intravenous, Q12H, Tsosie Billing, MD, Stopped at 07/09/19 0040 .  chlorhexidine gluconate (MEDLINE KIT) (PERIDEX) 0.12 % solution 15 mL, 15 mL, Mouth Rinse, BID, Lanney Gins, Fuad, MD, 15 mL at 07/09/19 0901 .  Chlorhexidine Gluconate Cloth 2 % PADS 6 each, 6 each, Topical, Daily, Athena Masse, MD, 6 each at 07/06/19 1036 .  dexamethasone (DECADRON) injection 10 mg, 10 mg, Intravenous, Q12H, Ottie Glazier, MD, 10 mg at 07/09/19 0935 .  dexmedetomidine (PRECEDEX) 400 MCG/100ML (4 mcg/mL) infusion, 0.2-0.7 mcg/kg/hr, Intravenous, Continuous, Swayze, Ava, DO, Stopped at 07/07/19  1922 .  dexmedetomidine (PRECEDEX) bolus via infusion 102.7 mcg, 1 mcg/kg, Intravenous, Once, Swayze, Ava, DO, Stopped at 07/07/19 1923 .  fentaNYL 2563mg in NS 25105m(1027mml) infusion-PREMIX, 0-400 mcg/hr, Intravenous, Continuous, AleOttie GlazierD, Stopped at 07/09/19 0857 .  folic acid injection 1 mg, 1 mg, Intravenous, Daily, AleLanney Ginsuad, MD, 1 mg at 07/09/19 0930 .  free water 200 mL, 200 mL, Per Tube, Q4H, KeeDarel Hong NP, 200 mL at 07/09/19 0941 .  insulin aspart (novoLOG) injection 0-20 Units, 0-20 Units, Subcutaneous, Q4H, KeeDarel Hong NP, 7 Units at 07/09/19 [phone] levETIRAcetam (KEPPRA) IVPB 1000 mg/100 mL premix, 1,000 mg, Intravenous, Q12H, ReyAlexis GoodellD, Last Rate: 400 mL/hr at 07/09/19 0925, 1,000 mg at 07/09/19 0925 .  MEDLINE mouth rinse, 15 mL, Mouth Rinse, 10 times per day, AleOttie GlazierD, 15 mL at 07/09/19 0609 .  midazolam (VERSED) injection 2 mg, 2 mg, Intravenous, Q15 min PRN, KeeDarel Hong NP, 2 mg at 07/08/19 0052 .  midazolam (VERSED) injection 2 mg, 2 mg, Intravenous, Q2H PRN, KeeDarel Hong NP, 2 mg at 07/08/19 2009 .  norepinephrine (LEVOPHED) 16 mg in 250m41memix infusion, 0-40 mcg/min, Intravenous, Titrated, KeenBradly Bienenstock, Stopped at 07/09/19 [phone] (MYCOSTATIN) 100000 UNIT/ML suspension 500,000 Units, 5 mL, Oral, QID, KeenDarel HongNP, 500,000 Units at 07/09/19 0941 .  ondansetron (ZOFRAN) tablet 4 mg, 4 mg, Oral, Q6H PRN **OR** ondansetron (ZOFRAN) injection 4 mg, 4 mg, Intravenous, Q6H PRN, DuncAthena Masse, 4 mg at 07/06/19 0300 .  pantoprazole sodium (PROTONIX) 40 mg/20 mL oral suspension 40 mg, 40 mg, Per Tube, Daily, AlesOttie Glazier, 40 mg at 07/09/19 0940 .  phenytoin (DILANTIN) 150 mg in sodium chloride 0.9 % 100 mL IVPB, 150 mg, Intravenous, Q8H, ReynAlexis Goodell, Last Rate: 206 mL/hr at 07/09/19 0629, 150 mg at 07/09/19 0629 .  propofol (DIPRIVAN) 1000 MG/100ML infusion, 5-80  mcg/kg/min, Intravenous, Titrated, AlesOttie Glazier, Stopped at 07/09/19 0857 .  senna-docusate (Senokot-S) tablet 2 tablet, 2 tablet, Per Tube, BID, SimpCharlett NoseH, 2 tablet at 07/09/19 0941 .  sodium chloride flush (NS) 0.9 % injection 10-40 mL, 10-40 mL, Intracatheter, PRN, ChatBonnell Publiclu, MD .  sodium chloride flush (NS) 0.9 % injection 10-40 mL, 10-40 mL, Intracatheter, Q12H, KeenDarel HongNP, 40 mL at 07/09/19 0955 .  tamsulosin (FLOMAX) capsule 0.4 mg, 0.4 mg, Oral, Daily, ChatBonnell Publiclu, MD, 0.4 mg at 07/09/19 0941 .  thiamine (B-1) injection 100 mg, 100 mg, Intravenous, Daily, AlesLanney Ginsad, MD, 100 mg at 07/09/19 0936 .  ziprasidone (GEODON) injection 20 mg, 20 mg, Intramuscular, Q4H PRN, Swayze, Ava, DO  Physical exam:  Vitals:  07/09/19 0718 07/09/19 0800 07/09/19 0859 07/09/19 0900  BP:  107/68  105/70  Pulse:  77 77 77  Resp:  _0 Temp:   97.7 F (36.5 C)   TempSrc:   Oral   SpO2: 95% 95% 95% 93%  Weight:      Height:       Physical Exam Constitutional:      Comments: Intubated but not sedated   Abdominal:     General: There is no distension.     Palpations: Abdomen is soft.     Comments: Foley draining clear urine  Musculoskeletal:     Comments: Right toe diabetic ulcer with dressing in place      CMP Latest Ref Rng & Units 07/09/2019  Glucose 70 - 99 mg/dL 224(H)  BUN 6 - 20 mg/dL 17  Creatinine 0.61 - 1.24 mg/dL 1.25(H)  Sodium 135 - 145 mmol/L 149(H)  Potassium 3.5 - 5.1 mmol/L 4.3  Chloride 98 - 111 mmol/L 119(H)  CO2 22 - 32 mmol/L 25  Calcium 8.9 - 10.3 mg/dL 8.0(L)  Total Protein 6.5 - 8.1 g/dL -  Total Bilirubin 0.3 - 1.2 mg/dL -  Alkaline Phos 38 - 126 U/L -  AST 15 - 41 U/L -  ALT 0 - 44 U/L -   CBC Latest Ref Rng & Units 07/09/2019  WBC 4.0 - 10.5 K/uL 18.6(H)  Hemoglobin 13.0 - 17.0 g/dL 10.7(L)  Hematocrit 39.0 - 52.0 % 33.5(L)  Platelets 150 - 400 K/uL 200    _1 @  EEG  Result  Date: 07/07/2019 Alexis Goodell, MD     07/07/2019  2:03 PM ELECTROENCEPHALOGRAM REPORT Patient: Owyn Raulston       Room #: IC13A-AA EEG No. ID: [id number] Age: 54 y.o.        Sex: male Requesting Physician: Swayze Report Date:  07/07/2019       Interpreting Physician: Alexis Goodell History: Luisalberto Beegle is an 54 y.o. male with altered mental status and seizures Medications: Keppra, Dilantin, Acyclovir, Omnipen, Rocephin, Decadron, Insulin, Ativan Conditions of Recording:  This is a 21 channel routine scalp EEG performed with bipolar and monopolar montages arranged in accordance to the international 10/20 system of electrode placement. One channel was dedicated to EKG recording. The patient is in the altered state state. Description:  The background activity is slow and poorly organized.  It consists of a polymorphic delta activity that is diffusely distributed.  There is superimposed fast beta activity noted as well.  There is no noted hemispheric asymmetry. There is no noted epileptiform activity.  There are no incidences of eye deviation or head turning.  Hyperventilation and intermittent photic stimulation were not performed. IMPRESSION: This is an abnormal EEG secondary to general background slowing.  This finding may be seen with a diffuse disturbance that is etiologically nonspecific, but may include a metabolic encephalopathy, among other possibilities.  No epileptiform activity was noted.  The superimposed beta activity noted is consistent with medication effect.  Alexis Goodell, MD Neurology [phone] 07/07/2019, 1:57 PM   EEG  Result Date: 07/06/2019 Alexis Goodell, MD     07/06/2019  4:17 PM ELECTROENCEPHALOGRAM REPORT Patient: Shey Bartmess       Room #: IC13A-AA EEG No. ID: [id number] Age: 54 y.o.        Sex: male Requesting Physician: Jamse Arn Report Date:  07/06/2019       Interpreting Physician: Alexis Goodell History: Geoffery Aultman is an 54 y.o. male with new onset seizures  Medications: Acyclovir, Ampicillin, Suboxone, Rocephin, Decadron, Insulin, Keppra, Flomax, Protonix Conditions of Recording:  This is a 21 channel routine scalp EEG performed with bipolar and monopolar montages arranged in accordance to the international 10/20 system of electrode placement. One channel was dedicated to EKG recording. The patient is in the poorly responsive state. Description:  The background activity is slow and poorly organized.  This slow activity consists of a low voltage delta activity that is diffusely distributed.  There is superimposed beta activity noted as well.  The patient is positioned on his left.  Despite this there does appear some intermittent further slowing over the right temporoparietal region.   Frequent sharp waves are noted in this region as well.  The patient has periods when he further turns his head to the left and extensive artifact is noted.  Patient is able to respond to questioning at these times.  Patient also has periods of build up in beta activity over the right hemisphere without change clinically.  Hyperventilation and intermittent photic stimulation were not performed. IMPRESSION: This is an abnormal electroencephalogram secondary to general background slowing with focal slowing and sharp waves intermittently noted in the right temporoparietal region.  This is consistent with the patient's history of focal left sided seizures.  Patient also with episodes of fast beta activity-some with head turning but responsiveness and some with no change in clinical activity.  Difficult clear characterization but can not rule out focal seizure activity.  Alexis Goodell, MD Neurology [phone] 07/06/2019, 4:04 PM   CT Angio Head W or Wo Contrast  Result Date: 07/04/2019 CLINICAL DATA:  Weakness, syncope. EXAM: CT ANGIOGRAPHY HEAD AND NECK TECHNIQUE: Multidetector CT imaging of the head and neck was performed using the standard protocol during bolus administration of  intravenous contrast. Multiplanar CT image reconstructions and MIPs were obtained to evaluate the vascular anatomy. Carotid stenosis measurements (when applicable) are obtained utilizing NASCET criteria, using the distal internal carotid diameter as the denominator. CONTRAST:  66m OMNIPAQUE IOHEXOL 350 MG/ML SOLN COMPARISON:  None. FINDINGS: CT HEAD FINDINGS Brain: There is no mass, hemorrhage or extra-axial collection. The size and configuration of the ventricles and extra-axial CSF spaces are normal. 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 The brain parenchyma is normal. Skull: The visualized skull base, calvarium and extracranial soft tissues are normal. Sinuses/Orbits: No fluid levels or advanced mucosal thickening of the visualized paranasal sinuses. No mastoid or middle ear effusion. The orbits are normal. CTA NECK FINDINGS SKELETON: There is no bony spinal canal stenosis. No lytic or blastic lesion. OTHER NECK: Normal pharynx, larynx and major salivary glands. No cervical lymphadenopathy. Unremarkable thyroid gland. UPPER CHEST: No pneumothorax or pleural effusion. No nodules or masses. AORTIC ARCH: There is no calcific atherosclerosis of the aortic arch. There is no aneurysm, dissection or hemodynamically significant stenosis of the visualized portion of the aorta. Conventional 3 vessel aortic branching pattern. The visualized proximal subclavian arteries are widely patent. RIGHT CAROTID SYSTEM: Normal without aneurysm, dissection or stenosis. LEFT CAROTID SYSTEM: Normal without aneurysm, dissection or stenosis. VERTEBRAL ARTERIES: Codominant configuration. Both origins are clearly patent. There is no dissection, occlusion or flow-limiting stenosis to the skull base (V1-V3 segments). CTA HEAD FINDINGS POSTERIOR CIRCULATION: --Vertebral arteries: Normal V4 segments. --Posterior inferior cerebellar arteries (PICA): Patent origins from the vertebral arteries. --Anterior inferior cerebellar arteries  (AICA): Patent origins from the basilar artery. --Basilar artery: Normal. --Superior cerebellar arteries: Normal. --Posterior cerebral arteries: Normal. Both originate from the basilar artery. Posterior communicating arteries (p-comm) are  diminutive or absent. ANTERIOR CIRCULATION: --Intracranial internal carotid arteries: Normal. --Anterior cerebral arteries (ACA): Normal. Both A1 segments are present. Patent anterior communicating artery (a-comm). --Middle cerebral arteries (MCA): Normal. VENOUS SINUSES: As permitted by contrast timing, patent. ANATOMIC VARIANTS: None Review of the MIP images confirms the above findings. IMPRESSION: 1. No emergent large vessel occlusion or high-grade stenosis. 2. 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 Electronically Signed   By: Ulyses Jarred M.D.   On: 07/04/2019 21:25   DG Abd 1 View  Result Date: 07/07/2019 CLINICAL DATA:  Orogastric tube placement. EXAM: ABDOMEN - 1 VIEW COMPARISON:  Same day. FINDINGS: The bowel gas pattern is normal. Distal tip of enteric tube is seen in expected position of proximal stomach. No radio-opaque calculi or other significant radiographic abnormality are seen. IMPRESSION: Distal tip of enteric tube seen in expected position of proximal stomach. Electronically Signed   By: Marijo Conception M.D.   On: 07/07/2019 15:18   DG Abd 1 View  Result Date: 07/07/2019 CLINICAL DATA:  Orogastric tube placement. EXAM: ABDOMEN - 1 VIEW COMPARISON:  None. FINDINGS: The bowel gas pattern is normal. Distal tip of enteric tube is seen in expected position of distal esophagus. No radio-opaque calculi or other significant radiographic abnormality are seen. IMPRESSION: Distal tip of enteric tube is seen in expected position of distal esophagus. No evidence of bowel obstruction or ileus. Electronically Signed   By: Marijo Conception M.D.   On: 07/07/2019 13:50   DG Abd 1 View  Result Date: 07/06/2019 CLINICAL DATA:  Possible heavy metal poisoning  EXAM: ABDOMEN - 1 VIEW COMPARISON:  None. FINDINGS: Scattered large and small bowel gas is noted. Mild retained fecal material is noted. No intraluminal radiopacities are seen. Diffuse thickening of the femoral cortex is noted bilaterally in a symmetrical fashion. This may be related to the patient's underlying sickle cell disease or possibly underlying Paget's. No other focal abnormality is seen. IMPRESSION: Thickened cortex within the femurs in a symmetrical fashion bilaterally likely related to the patient's underlying sickle cell disease or possible Paget's disease. No definitive radiopacities are noted within the bowel to correspond with the given clinical history. Electronically Signed   By: Inez Catalina M.D.   On: 07/06/2019 16:12   CT Head Wo Contrast  Result Date: 07/04/2019 CLINICAL DATA:  54 year old male with history of headache. Multiple falls. Seizures. EXAM: CT HEAD WITHOUT CONTRAST TECHNIQUE: Contiguous axial images were obtained from the base of the skull through the vertex without intravenous contrast. COMPARISON:  Head CT 07/03/2019. FINDINGS: Brain: Patchy and confluent areas of decreased attenuation are noted throughout the deep and periventricular white matter of the cerebral hemispheres bilaterally, compatible with chronic microvascular ischemic disease. No evidence of acute infarction, hemorrhage, hydrocephalus, extra-axial collection or mass lesion/mass effect. Vascular: No hyperdense vessel or unexpected calcification. Skull: Normal. Negative for fracture or focal lesion. Sinuses/Orbits: No acute finding. Other: None. IMPRESSION: 1. No acute intracranial abnormalities. 2. Chronic microvascular ischemic changes in the cerebral white matter, as above. Electronically Signed   By: Vinnie Langton M.D.   On: 07/04/2019 18:13   CT Head Wo Contrast  Result Date: 07/03/2019 CLINICAL DATA:  Recent fall with headaches EXAM: CT HEAD WITHOUT CONTRAST TECHNIQUE: Contiguous axial images were  obtained from the base of the skull through the vertex without intravenous contrast. COMPARISON:  None. FINDINGS: Brain: Mild atrophic changes are noted. Areas of prior ischemia are seen in the frontal lobes bilaterally. No findings to suggest acute hemorrhage,  acute infarction or space-occupying mass lesion are seen. Vascular: No hyperdense vessel or unexpected calcification. Skull: Normal. Negative for fracture or focal lesion. Sinuses/Orbits: No acute finding. Other: None. IMPRESSION: Mild atrophic changes and areas of prior ischemia without acute abnormality. Electronically Signed   By: Inez Catalina M.D.   On: 07/03/2019 23:13   CT Angio Neck W and/or Wo Contrast  Result Date: 07/04/2019 CLINICAL DATA:  Weakness, syncope. EXAM: CT ANGIOGRAPHY HEAD AND NECK TECHNIQUE: Multidetector CT imaging of the head and neck was performed using the standard protocol during bolus administration of intravenous contrast. Multiplanar CT image reconstructions and MIPs were obtained to evaluate the vascular anatomy. Carotid stenosis measurements (when applicable) are obtained utilizing NASCET criteria, using the distal internal carotid diameter as the denominator. CONTRAST:  58m OMNIPAQUE IOHEXOL 350 MG/ML SOLN COMPARISON:  None. FINDINGS: CT HEAD FINDINGS Brain: There is no mass, hemorrhage or extra-axial collection. The size and configuration of the ventricles and extra-axial CSF spaces are normal. 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 The brain parenchyma is normal. Skull: The visualized skull base, calvarium and extracranial soft tissues are normal. Sinuses/Orbits: No fluid levels or advanced mucosal thickening of the visualized paranasal sinuses. No mastoid or middle ear effusion. The orbits are normal. CTA NECK FINDINGS SKELETON: There is no bony spinal canal stenosis. No lytic or blastic lesion. OTHER NECK: Normal pharynx, larynx and major salivary glands. No cervical lymphadenopathy. Unremarkable thyroid  gland. UPPER CHEST: No pneumothorax or pleural effusion. No nodules or masses. AORTIC ARCH: There is no calcific atherosclerosis of the aortic arch. There is no aneurysm, dissection or hemodynamically significant stenosis of the visualized portion of the aorta. Conventional 3 vessel aortic branching pattern. The visualized proximal subclavian arteries are widely patent. RIGHT CAROTID SYSTEM: Normal without aneurysm, dissection or stenosis. LEFT CAROTID SYSTEM: Normal without aneurysm, dissection or stenosis. VERTEBRAL ARTERIES: Codominant configuration. Both origins are clearly patent. There is no dissection, occlusion or flow-limiting stenosis to the skull base (V1-V3 segments). CTA HEAD FINDINGS POSTERIOR CIRCULATION: --Vertebral arteries: Normal V4 segments. --Posterior inferior cerebellar arteries (PICA): Patent origins from the vertebral arteries. --Anterior inferior cerebellar arteries (AICA): Patent origins from the basilar artery. --Basilar artery: Normal. --Superior cerebellar arteries: Normal. --Posterior cerebral arteries: Normal. Both originate from the basilar artery. Posterior communicating arteries (p-comm) are diminutive or absent. ANTERIOR CIRCULATION: --Intracranial internal carotid arteries: Normal. --Anterior cerebral arteries (ACA): Normal. Both A1 segments are present. Patent anterior communicating artery (a-comm). --Middle cerebral arteries (MCA): Normal. VENOUS SINUSES: As permitted by contrast timing, patent. ANATOMIC VARIANTS: None Review of the MIP images confirms the above findings. IMPRESSION: 1. No emergent large vessel occlusion or high-grade stenosis. 2. 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 Electronically Signed   By: KUlyses JarredM.D.   On: 07/04/2019 21:25   MR BRAIN WO CONTRAST  Addendum Date: 07/06/2019   ADDENDUM REPORT: 07/06/2019 18:10 ADDENDUM: These results were called by telephone at the time of interpretation on 07/06/2019 at 6:10 pm to [facility], who verbally acknowledged these results. Electronically Signed   By: KKellie SimmeringDO   On: 07/06/2019 18:10   Result Date: 07/06/2019 CLINICAL DATA:  Ophthalmoplegia; encephalopathy, seizure, abnormal neuro exam. Additional history provided: Prior history of sickle thalassemia, COPD, diabetes and hypertension, presenting with headache and aching all over, altered at times with intermittent shaking of left arm and leg with deviation head to the left while awake, findings felt to reflect partial seizure. EXAM: MRI HEAD WITHOUT CONTRAST TECHNIQUE: Multiplanar, multiecho pulse  sequences of the brain and surrounding structures were obtained without intravenous contrast. COMPARISON:  CT venogram 07/04/2019, noncontrast head CT and CT angiogram head/neck 07/04/2019 FINDINGS: Brain: The patient was unable to tolerate the full examination. As a result only axial and coronal diffusion-weighted imaging, a sagittal T1 weighted sequence, an axial T2* sequence, an axial T2 FLAIR sequence and a coronal T2 weighted sequence were obtained. There is moderate/severe motion degradation of the sagittal T1 weighted sequence and axial T2 FLAIR sequence. There is severe motion degradation of the coronal T2 weighted sequence. There is subtle cortical diffusion-weighted signal abnormality throughout much of the right parietal lobe and extending inferiorly to involve portions of the right occipital lobe and posteromedial right temporal lobe. There are a few more punctate foci of restricted diffusion within the high right parietal lobe (series 5, image 30). Evaluation is limited due to significant motion degradation, but there appears to be corresponding T2/FLAIR hyperintensity at these sites. 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 Multifocal T2/FLAIR hyperintensity within the cerebral white matter and pons is nonspecific but likely reflect sequela of chronic small vessel ischemia. There is  suggestion of a thin right-sided subdural hygroma measuring approximately 5 mm in thickness greatest overlying the right frontal lobe (for instance as seen on series 8, image 16). This finding was not definitively present on prior exams. No intracranial mass is identified. No midline shift. No chronic intracranial blood products. Vascular: Poorly assessed on the acquired sequences. Skull and upper cervical spine: No focal marrow lesion is identified within the limitations of significant motion degradation. Sinuses/Orbits: The orbits and paranasal sinuses are poorly assessed due to the degree of motion degradation. IMPRESSION: 1. Cortical diffusion-weighted signal abnormality and corresponding T2/FLAIR hyperintensity involving much of the right parietal lobe and extending inferiorly to involve portions of the right occipital lobe and posteromedial right temporal lobe. There are a few additional punctate foci of restricted diffusion within the high right parietal lobe. Findings are suspicious for seizure related changes and/or cerebritis. Acute ischemia cannot be excluded, particularly for the punctate foci within the high right parietal lobe. 2. 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 This finding was not definitively present on prior exams. 3. 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 4. Chronic small vessel ischemic changes within the cerebral white matter and pons. Electronically Signed: By: Kellie Simmering DO On: 07/06/2019 17:57   MR BRAIN W WO CONTRAST  Result Date: 07/08/2019 CLINICAL DATA:  Seizures. EXAM: MRI HEAD WITHOUT AND WITH CONTRAST TECHNIQUE: Multiplanar, multiecho pulse sequences of the brain and surrounding structures were obtained without and with intravenous contrast. CONTRAST:  12m GADAVIST GADOBUTROL 1 MMOL/ML IV SOLN COMPARISON:  Brain MRI 07/06/2019 FINDINGS: Brain: Cortical diffusion abnormality within the right hemisphere has resolved  aside from a small area in the right occipital lobe. Areas of encephalomalacia in both frontal lobes are unchanged. There is mild multifocal white matter hyperintensity. There is an old left cerebellar infarct. No chronic microhemorrhage. Normal midline structures. 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 Skull and upper cervical spine: Normal marrow signal. Sinuses/Orbits: Negative. Other: None. IMPRESSION: 1. No acute intracranial abnormality. 2. Cortical diffusion abnormality within the right hemisphere has resolved aside from a small area in the right occipital lobe. This may indicate postictal change or small area of subacute ischemia. 3. Unchanged areas of encephalomalacia in the frontal lobes bilaterally. Old left cerebellar infarct. Electronically Signed   By: KCletus GashD.  On: 07/08/2019 00:45   DG Chest Port 1 View  Result Date: 07/08/2019 CLINICAL DATA:  Headache, aching all over, COPD, smoker EXAM: PORTABLE CHEST 1 VIEW COMPARISON:  Portable exam 1146 hours compared to 07/07/2019 FINDINGS: Tip of endotracheal tube projects 6.1 cm above carina. Nasogastric tube extends into stomach. Enlargement of cardiac silhouette. Stable mediastinal contours. Scattered infiltrates in both lungs, favor mild pulmonary edema over infection. Subsegmental atelectasis RIGHT base. No pleural effusion or pneumothorax. IMPRESSION: Enlargement of cardiac silhouette with BILATERAL pulmonary infiltrates question pulmonary edema. RIGHT basilar atelectasis. Electronically Signed   By: Lavonia Dana M.D.   On: 07/08/2019 12:57   DG Chest Port 1 View  Result Date: 07/07/2019 CLINICAL DATA:  Central line placement EXAM: PORTABLE CHEST 1 VIEW COMPARISON:  Radiographs 09/19/2018 FINDINGS: Endotracheal tube terminates in the mid trachea, 4.5 cm from the carina. Left IJ approach central venous catheter tip terminates at the brachiocephalic-caval confluence. Transesophageal tube tip  again seen terminating in the proximal stomach with the side port at the level of the GE junction. Telemetry leads overlie the chest. Stable cardiomegaly with central vascular congestion and diffuse mixed interstitial and patchy airspace opacity concerning for interstitial and alveolar pulmonary edema. Hazy obscuration of the right hemidiaphragm may reflect some trace effusion. No pneumothorax. No acute osseous or soft tissue abnormality. Sclerotic changes of the left humeral head likely reflect sequela of prior avascular necrosis. Additional degenerative changes in the left shoulder and spine. IMPRESSION: 1. Left IJ approach central venous catheter tip at the left brachiocephalic-caval confluence. 2. Endotracheal tube terminates in the mid trachea, 4.5 cm from the carina. 3. Transesophageal tube tip again seen terminating in the proximal stomach with the side port at the level of the GE junction. Consider advancing 3 cm to position in the gastric body. 4. Persistent features suggestive of interstitial and alveolar edema. Likely developing right pleural effusion. Electronically Signed   By: Lovena Le M.D.   On: 07/07/2019 22:32   Portable Chest x-ray  Result Date: 07/07/2019 CLINICAL DATA:  Endotracheal tube placement. EXAM: PORTABLE CHEST 1 VIEW COMPARISON:  July 04, 2019. FINDINGS: Stable cardiomegaly with central pulmonary vascular congestion. Endotracheal and nasogastric tubes are unchanged in position. Increased bilateral interstitial densities are noted concerning for pulmonary edema. No pneumothorax or pleural effusion is noted. Bony thorax is unremarkable. IMPRESSION: Stable support apparatus. Stable cardiomegaly with central pulmonary vascular congestion. Increased bilateral interstitial densities are noted concerning for pulmonary edema. Electronically Signed   By: Marijo Conception M.D.   On: 07/07/2019 13:50   DG Chest Portable 1 View  Result Date: 07/04/2019 CLINICAL DATA:  Chest pain EXAM:  PORTABLE CHEST 1 VIEW COMPARISON:  10/04/2018, 12/13/2017 FINDINGS: Cardiomegaly. Mild diffuse coarse chronic interstitial opacity. No acute focal airspace disease or effusion. Aortic atherosclerosis. No pneumothorax. IMPRESSION: Cardiomegaly. No edema or infiltrate. Mild diffuse coarse chronic interstitial opacity Electronically Signed   By: Donavan Foil M.D.   On: 07/04/2019 18:19   ECHOCARDIOGRAM COMPLETE  Result Date: 07/08/2019    ECHOCARDIOGRAM REPORT   Patient Name:   LASEAN GORNIAK Date of Exam: 07/08/2019 Medical Rec #:  [id number]       Height:       75.0 in Accession #:    [id number]      Weight:       226.4 lb Date of Birth:  [birth year]        BSA:          2.313 m Patient Age:  53 years        BP:           96/62 mmHg Patient Gender: M               HR:           105 bpm. Exam Location:  [facility] Procedure: 2D Echo and Intracardiac Opacification Agent Indications:     Acute Respiratory Insufficiency 518.82/ R06.89  History:         Patient has no prior history of Echocardiogram examinations.  Sonographer:     Arville Go RDCS Referring Phys:  [id number] Bradly Bienenstock Diagnosing Phys: Skeet Latch MD  Sonographer Comments: Technically challenging study due to limited acoustic windows, Technically difficult study due to poor echo windows, no subcostal window and echo performed with patient supine and on artificial respirator. Image acquisition challenging due to patient body habitus. IMPRESSIONS  1. Left ventricular ejection fraction, by estimation, is 55 to 60%. The left ventricle has normal function. The left ventricle has no regional wall motion abnormalities. There is mild concentric left ventricular hypertrophy. Left ventricular diastolic parameters are consistent with Grade I diastolic dysfunction (impaired relaxation).  2. Right ventricular systolic function is normal. The right ventricular size is normal. There is normal pulmonary artery systolic pressure.  3. The mitral valve is normal in  structure. Trivial mitral valve regurgitation. No evidence of mitral stenosis.  4. The aortic valve is normal in structure. Aortic valve regurgitation is not visualized. Mild aortic valve stenosis. Aortic valve area, by VTI measures 1.68 cm. Aortic valve mean gradient measures 10.0 mmHg. Aortic valve Vmax measures 2.22 m/s.  5. The inferior vena cava is normal in size with greater than 50% respiratory variability, suggesting right atrial pressure of 3 mmHg. FINDINGS  Left Ventricle: Left ventricular ejection fraction, by estimation, is 55 to 60%. The left ventricle has normal function. The left ventricle has no regional wall motion abnormalities. Definity contrast agent was given IV to delineate the left ventricular  endocardial borders. The left ventricular internal cavity size was normal in size. There is mild concentric left ventricular hypertrophy. Left ventricular diastolic parameters are consistent with Grade I diastolic dysfunction (impaired relaxation). Indeterminate filling pressures. Right Ventricle: The right ventricular size is normal. No increase in right ventricular wall thickness. Right ventricular systolic function is normal. There is normal pulmonary artery systolic pressure. The tricuspid regurgitant velocity is 1.59 m/s, and  with an assumed right atrial pressure of 10 mmHg, the estimated right ventricular systolic pressure is 45.4 mmHg. Left Atrium: Left atrial size was normal in size. Right Atrium: Right atrial size was normal in size. Pericardium: There is no evidence of pericardial effusion. Mitral Valve: The mitral valve is normal in structure. Normal mobility of the mitral valve leaflets. Trivial mitral valve regurgitation. No evidence of mitral valve stenosis. Tricuspid Valve: The tricuspid valve is normal in structure. Tricuspid valve regurgitation is trivial. No evidence of tricuspid stenosis. Aortic Valve: The aortic valve is normal in structure.. There is mild thickening and mild  calcification of the aortic valve. Aortic valve regurgitation is not visualized. Mild aortic stenosis is present. There is mild thickening of the aortic valve. There is mild calcification of the aortic valve. Aortic valve mean gradient measures 10.0 mmHg. Aortic valve peak gradient measures 19.7 mmHg. Aortic valve area, by VTI measures 1.68 cm. Pulmonic Valve: The pulmonic valve was normal in structure. Pulmonic valve regurgitation is not visualized. No evidence of pulmonic stenosis. Aorta: The aortic root is  normal in size and structure. Venous: The inferior vena cava is normal in size with greater than 50% respiratory variability, suggesting right atrial pressure of 3 mmHg. IAS/Shunts: No atrial level shunt detected by color flow Doppler.  LEFT VENTRICLE PLAX 2D LVIDd:         5.01 cm  Diastology LVIDs:         3.30 cm  LV e' lateral:   6.31 cm/s LV PW:         1.30 cm  LV E/e' lateral: 13.4 LV IVS:        1.37 cm  LV e' medial:    8.05 cm/s LVOT diam:     2.10 cm  LV E/e' medial:  10.5 LV SV:         63 LV SV Index:   27 LVOT Area:     3.46 cm  RIGHT VENTRICLE RV Basal diam:  3.37 cm RV S prime:     19.80 cm/s LEFT ATRIUM           Index       RIGHT ATRIUM           Index LA diam:      4.40 cm 1.90 cm/m  RA Area:     15.70 cm LA Vol (A4C): 44.9 ml 19.41 ml/m RA Volume:   42.90 ml  18.55 ml/m  AORTIC VALVE AV Area (Vmax):    1.49 cm AV Area (Vmean):   1.70 cm AV Area (VTI):     1.68 cm AV Vmax:           222.00 cm/s AV Vmean:          144.000 cm/s AV VTI:            0.373 m AV Peak Grad:      19.7 mmHg AV Mean Grad:      10.0 mmHg LVOT Vmax:         95.30 cm/s LVOT Vmean:        70.600 cm/s LVOT VTI:          0.181 m LVOT/AV VTI ratio: 0.49  AORTA Ao Root diam: 3.50 cm MITRAL VALVE                TRICUSPID VALVE MV Area (PHT): 5.84 cm     TR Peak grad:   10.1 mmHg MV Decel Time: 130 msec     TR Vmax:        159.00 cm/s MV E velocity: 84.80 cm/s MV A velocity: 106.00 cm/s  SHUNTS MV E/A ratio:  0.80          Systemic VTI:  0.18 m                             Systemic Diam: 2.10 cm Skeet Latch MD Electronically signed by Skeet Latch MD Signature Date/Time: 07/08/2019/12:08:49 PM    Final    CT VENOGRAM HEAD  Result Date: 07/04/2019 CLINICAL DATA:  Syncope and weakness EXAM: CT VENOGRAM HEAD TECHNIQUE: Vena graphic images of the head were obtained following the administration of intravenous contrast material. Multiplanar reformats and maximum intensity projections were constructed. CONTRAST:  56m OMNIPAQUE IOHEXOL 350 MG/ML SOLN COMPARISON:  CTA head neck same day FINDINGS: Superior sagittal sinus: Normal. Straight sinus: Normal. Inferior sagittal sinus, vein of Galen and internal cerebral veins: Normal. Transverse sinuses: Normal. Sigmoid sinuses: Normal. Visualized jugular veins: Normal. IMPRESSION: Normal head CT venogram.  Electronically Signed   By: Ulyses Jarred M.D.   On: 07/04/2019 21:27   US SPLEEN (ABDOMEN LIMITED)  Result Date: 07/08/2019 CLINICAL DATA:  Sickle cell disease, beta thalassemia EXAM: ULTRASOUND ABDOMEN LIMITED COMPARISON:  None. FINDINGS: Spleen atrophic, 4.4 cm length. Heterogeneous splenic echogenicity without focal mass. No LEFT upper quadrant free fluid. 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 No LEFT upper quadrant free fluid. Electronically Signed   By: Lavonia Dana M.D.   On: 07/08/2019 14:04   DG FL GUIDED LUMBAR PUNCTURE  Result Date: 07/07/2019 CLINICAL DATA:  Lumbar puncture. EXAM: DIAGNOSTIC LUMBAR PUNCTURE UNDER FLUOROSCOPIC GUIDANCE FLUOROSCOPY TIME:  Fluoroscopy Time:  0 minutes 24 seconds Radiation Exposure Index (if provided by the fluoroscopic device): 11.1 mGy PROCEDURE: After discussing the risks and benefits of this procedure with the patient's sister informed consent was obtained. Back was sterilely prepped and draped. 22 gauge needle was advanced into the L4-L5 space and clear CSF  obtained. 8 cc obtained and sent in 4 separate tubes to the ordered labs. Needle withdrawn. Hemostasis achieved. No complications. IMPRESSION: Successful fluoroscopically directed lumbar puncture. 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  Acute encephalopathy: ID and neurology on board.  Initially MRI showedCortical diffusion-weighted signal abnormality to the right parietal lobe and new additional punctate foci of restricted diffusion within the high right parietal lobe.  Findings concerning for seizure related changes and/or cerebritis.  Patient was given broad-spectrum antibiotics as well as acyclovir.  Lumbar puncture so far has not shown obvious infectious etiology and antibiotics have been deescalated.  Repeat MRI shows no acute intracranial abnormality and cartilage will diffusion of normality appears to be resolving aside from a small area in the right occipital lobe.  This may indicate posttreatment change of small area of subacute ischemia.  I spoke with Dr. Lanney Gins this morning.  Based on clinical presentation this may be PRES precipitating a seizure event.  This has been reported in some cases of sickle cell patients especially those who are on renal suppressants which the patient currently is not on any.  Plan is to wean him off sedation and see if he can be extubated.  Clinically if patient shows continued improvement, this could be PRES.   However if there is a concern for ischemic event with no clinical improvement in mental status- which I will defer to neurology-it would be reasonable to transfer the patient to a tertiary center which can offer exchange transfusion if need be to keep Hb S<30%.   2.  Sickle cell beta thalassemia: The patient is able to be extubated and can be switched back to outpatient  buprenorphine that would be ideal. Would minimize the use of opioids.  Labs can be normal and patients may still have sickle cell crisis. Transfuse, leucoreduced PRBC's if hb <8. No need for irradiated blood products. This was recommended yesterday and was given to patient with improvement of his hb  3. Leucocytosis- he has baseline leucocytosis with wbc that fluctuates between 11- 14. currenlt wbc between 18-19 likely steroid induced as well as reactive.    Visit Diagnosis 1. Chronic pain syndrome   2. New onset seizure ([place])   3. Severe headache   4. Other elevated white blood cell (WBC) count  5. Encephalopathy   6. Heavy metal poison. screen   7. Endotracheal tube present   8. Encounter for nasogastric (NG) tube placement   9. Encounter for imaging study to confirm orogastric (OG) tube placement   10. Seizure ([place])   11. Encounter for central line placement   12. Sickle cell beta thalassemia ([facility])   13. Abnormal CXR      Dr. Randa Evens, MD, MPH [facility] [id number] 07/09/2019 Not otherwise examined today.

## 2019-07-09 NOTE — Progress Notes (Addendum)
PROGRESS NOTE  John Haas ZOX:[id number] DOB: 11/22/1965 DOA: 07/04/2019 PCP: System, Pcp Not In  Brief History   John Haas a 54 y.o.malewith medical history significant forsickle thalassemia, COPD, diabetes and hypertension who presented to the emergency room with a complaint of headache and aching all over.He apparently went to [facility] ED yesterday but left without being seen. In the ER tonight he became belligerent at times and at times appeared confused. Also while in the emergency room he was noted to have intermittent shaking of his left arm and leg with deviation of his head to the left but he remained awake and alert during these episodes.  Patient had an extensive work-up in the emergency room that was significant for white cell count of 22,500 up from his baseline of 17,000 and he had mild hyponatremia of 129. Creatinine was 1.48 and blood sugar 348. Flu and Covid test were negative. He also had extensive imaging with CT venogram of the head, CT angio head and neck with and without contrast that showed no acute findings. The emergency room provider spoke with hematology at [facility] who did not think the findings had to do with his sickle thalassemia. He also spoke with neurology at [facility] health who recommended giving [name] for suspected partial seizures. Patient received Keppra and Ativan in the emergency room.He was also treated empirically for possible meningitis given his continued complaints of headache and slight worsening of his chronic leukocytosis.   In the course of the last 24 hours the patient became for more agitated. He also complained of headache pain. Narcotic pain medications, anxiolytics, and antipsychotics were used to try to resolve his agitation which became so severe finally as to cause a danger to the patient and caregivers alike. The patient was placed on a precedex drip. PCCM was consulted and the patient was intubated. The family was contacted and  permission was obtained for a lumbar puncture.   LP is not strongly supportive of bacterial meningitis or HSV. Await CSF meningitis/encephalitix PCR panel. DC acyclovir if negative. The patient was transfused with one unit PRBC's this morning due to recommendation of hematology, oncology.   MRI brain was repeated. It does not show cerebritis. Possible demonstrates post ictal changes. 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  On 07/09/2019 the patient was weaned off of precedex. PCCM plans to perform a trial off of mechanical ventilation later today. PCR for HSV off of CSF is negative for HSV. ID has stopped acyclovir. Antibiotics will be continued until PCR for bacterial pathogens returns.  Consultants  . Infectious disease . Neurology . PCCM . Interventional radiology . Hematology  Procedures  . Intubation and mechanical ventilation . Lumbar Puncture.  Antibiotics   Anti-infectives (From admission, onward)   Start     Dose/Rate Route Frequency Ordered Stop   07/07/19 1215  cefTRIAXone (ROCEPHIN) 2 g in sodium chloride 0.9 % 100 mL IVPB     2 g 200 mL/hr over 30 Minutes Intravenous Every 12 hours 07/07/19 1210     07/05/19 1000  cefTRIAXone (ROCEPHIN) 2 g in sodium chloride 0.9 % 100 mL IVPB  Status:  Discontinued     2 g 200 mL/hr over 30 Minutes Intravenous Every 12 hours 07/05/19 0051 07/06/19 1833   07/05/19 1000  vancomycin (VANCOREADY) IVPB 750 mg/150 mL  Status:  Discontinued     750 mg 150 mL/hr over 60 Minutes Intravenous Every 12 hours 07/05/19 0429 07/05/19 0858   07/05/19 1000  vancomycin (VANCOREADY) IVPB 1500  mg/300 mL  Status:  Discontinued     1,500 mg 150 mL/hr over 120 Minutes Intravenous Every 12 hours 07/05/19 0858 07/05/19 0934   07/05/19 1000  vancomycin (VANCOCIN) IVPB 1000 mg/200 mL premix  Status:  Discontinued     1,000 mg 200 mL/hr over 60 Minutes Intravenous Every 8 hours 07/05/19 0934 07/05/19 1252   07/05/19 0930  acyclovir  (ZOVIRAX) 920 mg in dextrose 5 % 150 mL IVPB  Status:  Discontinued     920 mg 168.4 mL/hr over 60 Minutes Intravenous Every 8 hours 07/05/19 0424 07/09/19 0004   07/05/19 0200  ampicillin (OMNIPEN) 2 g in sodium chloride 0.9 % 100 mL IVPB  Status:  Discontinued     2 g 300 mL/hr over 20 Minutes Intravenous Every 4 hours 07/05/19 0051 07/09/19 1625   07/05/19 0130  acyclovir (ZOVIRAX) 1,100 mg in dextrose 5 % 250 mL IVPB  Status:  Discontinued     1,100 mg 272 mL/hr over 60 Minutes Intravenous Every 8 hours 07/05/19 0046 07/05/19 0424   07/04/19 2100  vancomycin (VANCOREADY) IVPB 2000 mg/400 mL     2,000 mg 200 mL/hr over 120 Minutes Intravenous  Once 07/04/19 2020 07/05/19 0156   07/04/19 2030  cefTRIAXone (ROCEPHIN) 2 g in sodium chloride 0.9 % 100 mL IVPB     2 g 200 mL/hr over 30 Minutes Intravenous  Once 07/04/19 2018 07/05/19 0126      Subjective  The patient is intubated, ventilated, and sedated at the time of my visit. Sister is at bedside.  Objective   Vitals:  Vitals:   07/09/19 1605 07/09/19 1609  BP:    Pulse: (!) 115   Resp: 11   Temp: 97.9 F (36.6 C)   SpO2: 98% 98%   Exam:  Constitutional:  . The patient is intubated, ventilated, and sedated. No acute distress. Respiratory:  . No increased work of breathing. . No wheezes, rales, or rhonchi . No tactile fremitus Cardiovascular:  . Regular rate and rhythm . No murmurs, ectopy, or gallups. . No lateral PMI. No thrills. Abdomen:  . Abdomen is soft, non-tender, non-distended . No hernias, masses, or organomegaly . Normoactive bowel sounds.  Musculoskeletal:  . No cyanosis, clubbing, or edema Skin:  . No rashes, lesions, ulcers . palpation of skin: no induration or nodules Neurologic:  . Unable to evaluate as the patient is unable to cooperate with exam. Psychiatric:  Unable to evaluate as the patient is unable to cooperate with exam.  I have personally reviewed the following:   Today's Data  .  Vitals, BMP, CBC  Micro Data  . Blood Culture x 2 No growth. . 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 . Echocardiogram with EF of 55 - 60%. No wall motion abnormalities, Grade I diastolic dysfunction . CXR: Enlarged heart with bilateral pulmonary infiltrates.  Scheduled Meds: . chlorhexidine gluconate (MEDLINE KIT)  15 mL Mouth Rinse BID  . Chlorhexidine Gluconate Cloth  6 each Topical Daily  . dexamethasone (DECADRON) injection  10 mg Intravenous Q12H  . folic acid  1 mg Intravenous Daily  . free water  200 mL Per Tube Q4H  . insulin aspart  0-20 Units Subcutaneous Q4H  . mouth rinse  15 mL Mouth Rinse 10 times per day  . nystatin  5 mL Oral QID  . pantoprazole sodium  40 mg Per Tube Daily  . senna-docusate  2 tablet Per Tube BID  .  sodium chloride flush  10-40 mL Intracatheter Q12H  . tamsulosin  0.4 mg Oral Daily  . thiamine injection  100 mg Intravenous Daily   Continuous Infusions: . sodium chloride 75 mL/hr at 07/09/19 1600  . cefTRIAXone (ROCEPHIN)  IV 2 g (07/09/19 1349)  . levETIRAcetam 1,000 mg (07/09/19 0925)  . phenytoin (DILANTIN) IV 150 mg (07/09/19 1427)  . propofol (DIPRIVAN) infusion 15 mcg/kg/min (07/09/19 1600)    Principal Problem:   Suspect Meningitis Active Problems:   Sickle thalassemia disease (HCC)   Partial seizure ([place])   Headache   COPD with acute bronchitis (HCC)   Type 2 diabetes mellitus without complication ([place])   Acute confusion   Recurrent seizures (HCC)   Encephalopathy   LOS: 4 days   A & P  Likely focal/partial left-sided seizures: Pt now on Precedex drip, but will be weanedoff this morning. He will be given a triall off of mechanical ventilation later today as well. S/P LP to help rule out infectious causes and investigate other possible causes including autoimmune cerebritis or vasculidities.Patient's presentation has been confusing.  Patient appears to have  left-sided seizures associated with left gaze preference and urination although patient is awake during these episodes and this is followed by a postictal state.  In between these episodes patient is awake alert and uncooperative, often aggressive and cursing.    Very much appreciate ongoing input from neurology, infectious disease and hematology.  Patient underwent EEG on 07/06/2019 and seems to be having focal left-sided seizures.  MRI done on 07/06/2019 shows severe motion artifact however does also show restricted diffusion in the right parietal/occipital and temporal lobes which is consistent with edema secondary to seizure and/or cerebritis.  Ischemia is also in the differential although it is less likely. Cause/focus of seizures is unclear.   Patient was seen by Dr. Doy Mince of neurology who has increased his Keppra dose and also has written for as needed Ativan for treatment of seizures as needed.   Infectious disease has been consulted and patient has been broadly covered for acyclovir, ceftriaxone and ampicillin. However patient has repeatedly refused LP so definitive diagnosis has not been made.Dr. Delaine Lame does not feel that infectious etiology is behind the seizures and agitation/pain. On 07/09/2019 the patient's acyclovir was discontinued after PCR for HSV came back negative. Antibiotics for bacterial meningitis will be continued until PCR for bacterial pathogens is returned.  Hematology has also been consulted but they do not feel this is likely sickle cell/cell crisis given relatively stable outpatient course to date but they continue to follow closely.  MRI also noted to have a right subdural hygroma 5 mm, no mass-effect that appears to be new. No sign of cerebritis on recent MRI. Possible post ictal changes present.  Chronic pain/substance abuse: Continue Suboxone per home doses. There also has been some discussion that the patient had used some "street heroine". It is unknown what  substances he may be reacting to.  Today the plan is to wean the patient off of precedex and give him a trial off of mechanical ventilation. I greatly appreciate the assistance of PCCM, neurology, and infectious disease in this very challenging patient.  DM2: Sugars are under reasonable but not optimal control on SSI as written.  COPD: No evidence for acute flare.  Sickle cell thalassemia disease: As noted above, followed closely by Dr. Janese Banks hematology Patient does not appear to be in crisis per her evaluation  Hypernatremia: Improving slowly. More free water cauttiously.  I have  seen and examined this patient myself. I have spent 40 minutes in his evaluation and care. I have discussed the patient in detail with his sister who was at bedside. All questions answered to the best of my ability.  DVT prophylaxis: SCD Code Status: Full Family Communication: Have discussed the patient in detail with his sister and brother. All questions answered to the best of my ability. Disposition Plan:   Patient is from: Home  Anticipated Discharge Location: TBD  Barriers to Discharge: Acute ongoing seizures/agitation without diagnosis of definitive cause..  Is patient medically stable for Discharge: No    , DO [facility] Direct contact: see [email or web address]  7PM-7AM contact night coverage as above 07/09/2019, 5:35 PM  LOS: 2 days

## 2019-07-09 NOTE — Progress Notes (Signed)
ID CSF -HSV DNA negative- DC acyclovir Await meningitis panel ( PCR ) to result to further de-escalate antibiotics

## 2019-07-09 NOTE — Progress Notes (Signed)
CRITICAL CARE PROGRESS NOTE    Name: John Haas MRN: [id number] DOB: [birth year]     LOS: 4   SUBJECTIVE FINDINGS & SIGNIFICANT EVENTS   Patient description:   John Haas is a 54 y.o. male with medical history significant for sickle thalassemia, COPD, diabetes and hypertension who presented to the emergency room with a complaint of headache and aching all over.  He apparently went to [facility] ED yesterday but left without being seen.  In the ER tonight he became belligerent at times and at times appeared confused.  Also while in the emergency room he was noted to have intermittent shaking of his left arm and leg with deviation of his head to the left but he remained awake. Patient had an extensive work-up in the emergency room that was significant for white cell count of 22,500 up from his baseline of 17,000 and he had mild hyponatremia of 129.  Creatinine was 1.48 and blood sugar 348.  Flu and Covid test were negative.  He also had extensive imaging with CT venogram of the head, CT angio head and neck with and without contrast that showed no acute findings.  The emergency room provider spoke with hematology at [facility] who did not think the findings had to do with his sickle thalassemia.  He also spoke with neurology at [facility] health who recommended giving [place] for suspected partial seizures.  Patient received Keppra and Ativan in the emergency room.  He was also treated empirically for possible meningitis given his continued complaints of headache and slight worsening of his chronic leukocytosis.   Lines / Drains: Internal jugular central line, PIV x2  Cultures / Sepsis markers: Status post lumbar puncture, blood cultures  Antibiotics: Acyclovir, ampicillin, Rocephin,   Protocols / Consultants: PCCM, neurology,  hematology/oncology, infectious disease, hospitalist service   Events: 07/07/19-patient with recurrent partial seizure activity, evaluated at bedside with neurologist, patient is at high risk of trauma to tongue with possible bleeding/aspiration into airway, currently unable to protect airway, decision to intubate emergently.  Post intubation was able to speak with his sister and discuss care plan.  She is thankful for care, questions were answered sister is agreeable with care plan.  A lumbar puncture was able to be performed after endotracheal intubation.  Patient was placed on propofol and seizure activity has resolved. 07/08/19 -no seizure activity overnight. 07/09/19- plan to wean sedation with trial to liberate from MV today.    PAST MEDICAL HISTORY   Past Medical History:  Diagnosis Date  . COPD (chronic obstructive pulmonary disease) ([place])   . Diabetes mellitus without complication ([place])   . Heart attack ([place])   . Hypertension   . Opioid dependence ([place])    long-term narcotics use for sickle cell associated pain  . Sickle cell anemia ([facility])      SURGICAL HISTORY   Past Surgical History:  Procedure Laterality Date  . ANKLE SURGERY    . KNEE SURGERY    . PORT-A-CATH REMOVAL       FAMILY HISTORY   No family history on file.   SOCIAL HISTORY   Social History   Tobacco Use  . Smoking status: Current Every Day Smoker  . Smokeless tobacco: Never Used  Substance Use Topics  . Alcohol use: No  . Drug use: No     MEDICATIONS   Current Medication:  Current Facility-Administered Medications:  .  0.45 % sodium chloride infusion, , Intravenous, Continuous, Swayze, Ava, DO, Last Rate: 75 mL/hr at 07/09/19  0600, Rate Verify at 07/09/19 0600 .  acetaminophen (TYLENOL) tablet 650 mg, 650 mg, Oral, Q6H PRN, 650 mg at 07/05/19 1940 **OR** acetaminophen (TYLENOL) suppository 650 mg, 650 mg, Rectal, Q6H PRN, Judd Gaudier V, MD .  ampicillin (OMNIPEN) 2 g in sodium chloride 0.9 %  100 mL IVPB, 2 g, Intravenous, Q4H, Athena Masse, MD, Last Rate: 300 mL/hr at 07/09/19 0511, 2 g at 07/09/19 0511 .  belladonna-opium (B&O) suppository 16.2-30 mg, 1 suppository, Rectal, Q6H PRN, Swayze, Ava, DO .  cefTRIAXone (ROCEPHIN) 2 g in sodium chloride 0.9 % 100 mL IVPB, 2 g, Intravenous, Q12H, Tsosie Billing, MD, Stopped at 07/09/19 0040 .  chlorhexidine gluconate (MEDLINE KIT) (PERIDEX) 0.12 % solution 15 mL, 15 mL, Mouth Rinse, BID, Lanney Gins, Matilde Pottenger, MD, 15 mL at 07/09/19 0901 .  Chlorhexidine Gluconate Cloth 2 % PADS 6 each, 6 each, Topical, Daily, Athena Masse, MD, 6 each at 07/06/19 1036 .  dexamethasone (DECADRON) injection 10 mg, 10 mg, Intravenous, Q12H, Ottie Glazier, MD, 10 mg at 07/08/19 2201 .  dexmedetomidine (PRECEDEX) 400 MCG/100ML (4 mcg/mL) infusion, 0.2-0.7 mcg/kg/hr, Intravenous, Continuous, Swayze, Ava, DO, Stopped at 07/07/19 1922 .  dexmedetomidine (PRECEDEX) bolus via infusion 102.7 mcg, 1 mcg/kg, Intravenous, Once, Swayze, Ava, DO, Stopped at 07/07/19 1923 .  fentaNYL 2565mg in NS 2512m(1062mml) infusion-PREMIX, 0-400 mcg/hr, Intravenous, Continuous, Coltrane Tugwell, MD, Last Rate: 30 mL/hr at 07/09/19 0600, 300 mcg/hr at 07/09/19 0600 .  folic acid injection 1 mg, 1 mg, Intravenous, Daily, AleLanney Ginsuad, MD, 1 mg at 07/08/19 1313 .  free water 200 mL, 200 mL, Per Tube, Q4H, KeeDarel Hong NP .  insulin aspart (novoLOG) injection 0-20 Units, 0-20 Units, Subcutaneous, Q4H, KeeDarel Hong NP, 7 Units at 07/09/19 0458 .  levETIRAcetam (KEPPRA) IVPB 1000 mg/100 mL premix, 1,000 mg, Intravenous, Q12H, ReyAlexis GoodellD, Stopped at 07/08/19 2003 .  MEDLINE mouth rinse, 15 mL, Mouth Rinse, 10 times per day, AleOttie GlazierD, 15 mL at 07/09/19 0609 .  midazolam (VERSED) injection 2 mg, 2 mg, Intravenous, Q15 min PRN, KeeDarel Hong NP, 2 mg at 07/08/19 0052 .  midazolam (VERSED) injection 2 mg, 2 mg, Intravenous, Q2H PRN, KeeDarel Hong, NP, 2 mg at 07/08/19 2009 .  norepinephrine (LEVOPHED) 16 mg in 250m58memix infusion, 0-40 mcg/min, Intravenous, Titrated, KeenDarel HongNP, Last Rate: 1.88 mL/hr at 07/09/19 0600, 2 mcg/min at 07/09/19 0600 .  nystatin (MYCOSTATIN) 100000 UNIT/ML suspension 500,000 Units, 5 mL, Oral, QID, KeenDarel HongNP, 500,000 Units at 07/09/19 0052 .  ondansetron (ZOFRAN) tablet 4 mg, 4 mg, Oral, Q6H PRN **OR** ondansetron (ZOFRAN) injection 4 mg, 4 mg, Intravenous, Q6H PRN, DuncAthena Masse, 4 mg at 07/06/19 0300 .  pantoprazole sodium (PROTONIX) 40 mg/20 mL oral suspension 40 mg, 40 mg, Per Tube, Daily, AlesOttie Glazier, 40 mg at 07/08/19 0913 .  phenytoin (DILANTIN) 150 mg in sodium chloride 0.9 % 100 mL IVPB, 150 mg, Intravenous, Q8H, ReynAlexis Goodell, Last Rate: 206 mL/hr at 07/09/19 0629, 150 mg at 07/09/19 0629 .  propofol (DIPRIVAN) 1000 MG/100ML infusion, 5-80 mcg/kg/min, Intravenous, Titrated, Mikaela Hilgeman, MD, Last Rate: 30.8 mL/hr at 07/09/19 0817, 49.984 mcg/kg/min at 07/09/19 0817 .  senna-docusate (Senokot-S) tablet 2 tablet, 2 tablet, Per Tube, BID, SimpCharlett NoseH, 2 tablet at 07/08/19 2259 .  sodium chloride flush (NS) 0.9 % injection 10-40 mL, 10-40 mL, Intracatheter, PRN, ChatVashti Hey .  sodium chloride  flush (NS) 0.9 % injection 10-40 mL, 10-40 mL, Intracatheter, Q12H, Darel Hong D, NP, 10 mL at 07/08/19 2214 .  tamsulosin (FLOMAX) capsule 0.4 mg, 0.4 mg, Oral, Daily, Bonnell Public Tublu, MD, 0.4 mg at 07/08/19 0912 .  thiamine (B-1) injection 100 mg, 100 mg, Intravenous, Daily, Lanney Gins, Craven Crean, MD, 100 mg at 07/08/19 1157 .  ziprasidone (GEODON) injection 20 mg, 20 mg, Intramuscular, Q4H PRN, Swayze, Ava, DO    ALLERGIES   Ketamine, Nubain [nalbuphine hcl], and Stadol [butorphanol]    REVIEW OF SYSTEMS     Unable to obtain due to recurrent seizure activity.  PHYSICAL EXAMINATION   Vital Signs: Temp:  [96.7 F  (35.9 C)-99.7 F (37.6 C)] 97.7 F (36.5 C) (05/02 0859) Pulse Rate:  [74-114] 77 (05/02 0859) Resp:  [6-27] 16 (05/02 0859) BP: (91-164)/(42-105) 107/68 (05/02 0800) SpO2:  [89 %-98 %] 95 % (05/02 0859) FiO2 (%):  [40 %] 40 % (05/02 0718)  GENERAL: Appears younger than stated age. HEAD: Normocephalic, atraumatic.  EYES: Pupils equal, round, reactive to light.  No scleral icterus.  MOUTH: Moist mucosal membrane.+OG tube NECK: Supple. No thyromegaly. No nodules. No JVD.  PULMONARY: Mild crackles with decreased breath sounds bilaterally CARDIOVASCULAR: S1 and S2. Regular rate and rhythm. No murmurs, rubs, or gallops.  GASTROINTESTINAL: Soft, nontender, non-distended. No masses. Positive bowel sounds. No hepatosplenomegaly.  MUSCULOSKELETAL: No swelling, clubbing, or edema.  NEUROLOGIC: GCS4T SKIN:intact,warm,dry   PERTINENT DATA     Infusions: . sodium chloride 75 mL/hr at 07/09/19 0600  . ampicillin (OMNIPEN) IV 2 g (07/09/19 0511)  . cefTRIAXone (ROCEPHIN)  IV Stopped (07/09/19 0040)  . dexmedetomidine (PRECEDEX) IV infusion Stopped (07/07/19 1922)  . fentaNYL infusion INTRAVENOUS 300 mcg/hr (07/09/19 0600)  . levETIRAcetam Stopped (07/08/19 2003)  . norepinephrine (LEVOPHED) Adult infusion 2 mcg/min (07/09/19 0600)  . phenytoin (DILANTIN) IV 150 mg (07/09/19 0629)  . propofol (DIPRIVAN) infusion 49.984 mcg/kg/min (07/09/19 0817)   Scheduled Medications: . chlorhexidine gluconate (MEDLINE KIT)  15 mL Mouth Rinse BID  . Chlorhexidine Gluconate Cloth  6 each Topical Daily  . dexamethasone (DECADRON) injection  10 mg Intravenous Q12H  . dexmedetomidine  1 mcg/kg Intravenous Once  . folic acid  1 mg Intravenous Daily  . free water  200 mL Per Tube Q4H  . insulin aspart  0-20 Units Subcutaneous Q4H  . mouth rinse  15 mL Mouth Rinse 10 times per day  . nystatin  5 mL Oral QID  . pantoprazole sodium  40 mg Per Tube Daily  . senna-docusate  2 tablet Per Tube BID  . sodium  chloride flush  10-40 mL Intracatheter Q12H  . tamsulosin  0.4 mg Oral Daily  . thiamine injection  100 mg Intravenous Daily   PRN Medications: acetaminophen **OR** acetaminophen, belladonna-opium, midazolam, midazolam, ondansetron **OR** ondansetron (ZOFRAN) IV, sodium chloride flush, ziprasidone Hemodynamic parameters: CVP:  [5 mmHg-12 mmHg] 5 mmHg Intake/Output: 05/01 0701 - 05/02 0700 In: 4517.7 [I.V.:3110.5; IV Piggyback:1407.2] Out: 2325 [Urine:2275; Emesis/NG output:50]  Ventilator  Settings: Vent Mode: PRVC FiO2 (%):  [40 %] 40 % Set Rate:  [16 bmp] 16 bmp Vt Set:  [500 mL] 500 mL PEEP:  [5 cmH20] 5 cmH20 Plateau Pressure:  [14 cmH20-20 cmH20] 14 cmH20    LAB RESULTS:  Basic Metabolic Panel: Recent Labs  Lab 07/04/19 1832 07/04/19 1832 07/05/19 0503 07/05/19 0503 07/07/19 0237 07/07/19 0237 07/08/19 0600 07/09/19 0344  NA 129*  --  135  --  145  --  150* 149*  K 4.0   < > 5.1   < > 4.2   < > 3.4* 4.3  CL 96*  --  105  --  114*  --  119* 119*  CO2 22  --  20*  --  23  --  26 25  GLUCOSE 348*  --  348*  --  297*  --  160* 224*  BUN 22*  --  19  --  21*  --  21* 17  CREATININE 1.48*  --  1.18  --  1.02  --  1.08 1.25*  CALCIUM 8.9  --  8.6*  --  8.5*  --  8.3* 8.0*  MG  --   --   --   --   --   --  2.5*  --   PHOS  --   --   --   --   --   --  2.3*  --    < > = values in this interval not displayed.   Liver Function Tests: Recent Labs  Lab 07/04/19 1832 07/07/19 0237 07/08/19 1139  AST 27 12* 12*  ALT _0 ALKPHOS 116 80 71  BILITOT 1.6* 1.2 0.9  PROT 8.1 6.6 5.5*  ALBUMIN 3.9 2.9* 2.6*   No results for input(s): LIPASE, AMYLASE in the last 168 hours. No results for input(s): AMMONIA in the last 168 hours. CBC: Recent Labs  Lab 07/03/19 2216 07/03/19 2216 07/04/19 1832 07/04/19 1832 07/05/19 0743 07/07/19 0237 07/08/19 0345 07/08/19 2250 07/09/19 0344  WBC 19.7*   < > 22.5*  --  13.3* 19.0* 19.2*  --  18.6*  NEUTROABS 13.6*  --   17.0*  --   --   --   --   --   --   HGB 11.1*   < > 11.6*   < > 11.2* 10.7* 7.9* 9.7* 10.7*  HCT 33.8*   < > 35.0*   < > 34.5* 32.5* 24.7* 30.6* 33.5*  MCV 65.9*   < > 65.8*  --  66.5* 66.2* 67.5*  --  69.5*  PLT 352   < > 352  --  307 283 181  --  200   < > = values in this interval not displayed.   Cardiac Enzymes: No results for input(s): CKTOTAL, CKMB, CKMBINDEX, TROPONINI in the last 168 hours. BNP: Invalid input(s): POCBNP CBG: Recent Labs  Lab 07/08/19 1558 07/08/19 1927 07/08/19 2349 07/09/19 0440 07/09/19 0713  GLUCAP 152* 122* 143* 223* 204*       IMAGING RESULTS:  Imaging: EEG  Result Date: 07/07/2019 Alexis Goodell, MD     07/07/2019  2:03 PM ELECTROENCEPHALOGRAM REPORT Patient: Raiford Noble       Room #: IC13A-AA EEG No. ID: [id number] Age: 54 y.o.        Sex: male Requesting Physician: Swayze Report Date:  07/07/2019       Interpreting Physician: Alexis Goodell History: Author Hatlestad is an 54 y.o. male with altered mental status and seizures Medications: Keppra, Dilantin, Acyclovir, Omnipen, Rocephin, Decadron, Insulin, Ativan Conditions of Recording:  This is a 21 channel routine scalp EEG performed with bipolar and monopolar montages arranged in accordance to the international 10/20 system of electrode placement. One channel was dedicated to EKG recording. The patient is in the altered state state. Description:  The background activity is slow and poorly organized.  It consists of a polymorphic delta activity that is diffusely distributed.  There is  superimposed fast beta activity noted as well.  There is no noted hemispheric asymmetry. There is no noted epileptiform activity.  There are no incidences of eye deviation or head turning.  Hyperventilation and intermittent photic stimulation were not performed. IMPRESSION: This is an abnormal EEG secondary to general background slowing.  This finding may be seen with a diffuse disturbance that is etiologically  nonspecific, but may include a metabolic encephalopathy, among other possibilities.  No epileptiform activity was noted.  The superimposed beta activity noted is consistent with medication effect.  Alexis Goodell, MD Neurology [phone] 07/07/2019, 1:57 PM   DG Abd 1 View  Result Date: 07/07/2019 CLINICAL DATA:  Orogastric tube placement. EXAM: ABDOMEN - 1 VIEW COMPARISON:  Same day. FINDINGS: The bowel gas pattern is normal. Distal tip of enteric tube is seen in expected position of proximal stomach. No radio-opaque calculi or other significant radiographic abnormality are seen. IMPRESSION: Distal tip of enteric tube seen in expected position of proximal stomach. Electronically Signed   By: Marijo Conception M.D.   On: 07/07/2019 15:18   DG Abd 1 View  Result Date: 07/07/2019 CLINICAL DATA:  Orogastric tube placement. EXAM: ABDOMEN - 1 VIEW COMPARISON:  None. FINDINGS: The bowel gas pattern is normal. Distal tip of enteric tube is seen in expected position of distal esophagus. No radio-opaque calculi or other significant radiographic abnormality are seen. IMPRESSION: Distal tip of enteric tube is seen in expected position of distal esophagus. No evidence of bowel obstruction or ileus. Electronically Signed   By: Marijo Conception M.D.   On: 07/07/2019 13:50   MR BRAIN W WO CONTRAST  Result Date: 07/08/2019 CLINICAL DATA:  Seizures. EXAM: MRI HEAD WITHOUT AND WITH CONTRAST TECHNIQUE: Multiplanar, multiecho pulse sequences of the brain and surrounding structures were obtained without and with intravenous contrast. CONTRAST:  47m GADAVIST GADOBUTROL 1 MMOL/ML IV SOLN COMPARISON:  Brain MRI 07/06/2019 FINDINGS: Brain: Cortical diffusion abnormality within the right hemisphere has resolved aside from a small area in the right occipital lobe. Areas of encephalomalacia in both frontal lobes are unchanged. There is mild multifocal white matter hyperintensity. There is an old left cerebellar infarct. No chronic  microhemorrhage. Normal midline structures. 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 Skull and upper cervical spine: Normal marrow signal. Sinuses/Orbits: Negative. Other: None. IMPRESSION: 1. No acute intracranial abnormality. 2. Cortical diffusion abnormality within the right hemisphere has resolved aside from a small area in the right occipital lobe. This may indicate postictal change or small area of subacute ischemia. 3. Unchanged areas of encephalomalacia in the frontal lobes bilaterally. Old left cerebellar infarct. Electronically Signed   By: KUlyses JarredM.D.   On: 07/08/2019 00:45   DG Chest Port 1 View  Result Date: 07/08/2019 CLINICAL DATA:  Headache, aching all over, COPD, smoker EXAM: PORTABLE CHEST 1 VIEW COMPARISON:  Portable exam 1146 hours compared to 07/07/2019 FINDINGS: Tip of endotracheal tube projects 6.1 cm above carina. Nasogastric tube extends into stomach. Enlargement of cardiac silhouette. Stable mediastinal contours. Scattered infiltrates in both lungs, favor mild pulmonary edema over infection. Subsegmental atelectasis RIGHT base. No pleural effusion or pneumothorax. IMPRESSION: Enlargement of cardiac silhouette with BILATERAL pulmonary infiltrates question pulmonary edema. RIGHT basilar atelectasis. Electronically Signed   By: MLavonia DanaM.D.   On: 07/08/2019 12:57   DG Chest Port 1 View  Result Date: 07/07/2019 CLINICAL DATA:  Central line placement EXAM: PORTABLE CHEST 1 VIEW COMPARISON:  Radiographs 09/19/2018  FINDINGS: Endotracheal tube terminates in the mid trachea, 4.5 cm from the carina. Left IJ approach central venous catheter tip terminates at the brachiocephalic-caval confluence. Transesophageal tube tip again seen terminating in the proximal stomach with the side port at the level of the GE junction. Telemetry leads overlie the chest. Stable cardiomegaly with central vascular congestion and diffuse mixed interstitial and  patchy airspace opacity concerning for interstitial and alveolar pulmonary edema. Hazy obscuration of the right hemidiaphragm may reflect some trace effusion. No pneumothorax. No acute osseous or soft tissue abnormality. Sclerotic changes of the left humeral head likely reflect sequela of prior avascular necrosis. Additional degenerative changes in the left shoulder and spine. IMPRESSION: 1. Left IJ approach central venous catheter tip at the left brachiocephalic-caval confluence. 2. Endotracheal tube terminates in the mid trachea, 4.5 cm from the carina. 3. Transesophageal tube tip again seen terminating in the proximal stomach with the side port at the level of the GE junction. Consider advancing 3 cm to position in the gastric body. 4. Persistent features suggestive of interstitial and alveolar edema. Likely developing right pleural effusion. Electronically Signed   By: Lovena Le M.D.   On: 07/07/2019 22:32   Portable Chest x-ray  Result Date: 07/07/2019 CLINICAL DATA:  Endotracheal tube placement. EXAM: PORTABLE CHEST 1 VIEW COMPARISON:  July 04, 2019. FINDINGS: Stable cardiomegaly with central pulmonary vascular congestion. Endotracheal and nasogastric tubes are unchanged in position. Increased bilateral interstitial densities are noted concerning for pulmonary edema. No pneumothorax or pleural effusion is noted. Bony thorax is unremarkable. IMPRESSION: Stable support apparatus. Stable cardiomegaly with central pulmonary vascular congestion. Increased bilateral interstitial densities are noted concerning for pulmonary edema. Electronically Signed   By: Marijo Conception M.D.   On: 07/07/2019 13:50   ECHOCARDIOGRAM COMPLETE  Result Date: 07/08/2019    ECHOCARDIOGRAM REPORT   Patient Name:   DAMASCUS FELDPAUSCH Date of Exam: 07/08/2019 Medical Rec #:  [id number]       Height:       75.0 in Accession #:    [id number]      Weight:       226.4 lb Date of Birth:  [birth year]        BSA:          2.313 m Patient Age:     52 years        BP:           96/62 mmHg Patient Gender: M               HR:           105 bpm. Exam Location:  [facility] Procedure: 2D Echo and Intracardiac Opacification Agent Indications:     Acute Respiratory Insufficiency 518.82/ R06.89  History:         Patient has no prior history of Echocardiogram examinations.  Sonographer:     Arville Go RDCS Referring Phys:  [id number] Bradly Bienenstock Diagnosing Phys: Skeet Latch MD  Sonographer Comments: Technically challenging study due to limited acoustic windows, Technically difficult study due to poor echo windows, no subcostal window and echo performed with patient supine and on artificial respirator. Image acquisition challenging due to patient body habitus. IMPRESSIONS  1. Left ventricular ejection fraction, by estimation, is 55 to 60%. The left ventricle has normal function. The left ventricle has no regional wall motion abnormalities. There is mild concentric left ventricular hypertrophy. Left ventricular diastolic parameters are consistent with Grade I diastolic dysfunction (impaired relaxation).  2. Right ventricular  systolic function is normal. The right ventricular size is normal. There is normal pulmonary artery systolic pressure.  3. The mitral valve is normal in structure. Trivial mitral valve regurgitation. No evidence of mitral stenosis.  4. The aortic valve is normal in structure. Aortic valve regurgitation is not visualized. Mild aortic valve stenosis. Aortic valve area, by VTI measures 1.68 cm. Aortic valve mean gradient measures 10.0 mmHg. Aortic valve Vmax measures 2.22 m/s.  5. The inferior vena cava is normal in size with greater than 50% respiratory variability, suggesting right atrial pressure of 3 mmHg. FINDINGS  Left Ventricle: Left ventricular ejection fraction, by estimation, is 55 to 60%. The left ventricle has normal function. The left ventricle has no regional wall motion abnormalities. Definity contrast agent was given IV to  delineate the left ventricular  endocardial borders. The left ventricular internal cavity size was normal in size. There is mild concentric left ventricular hypertrophy. Left ventricular diastolic parameters are consistent with Grade I diastolic dysfunction (impaired relaxation). Indeterminate filling pressures. Right Ventricle: The right ventricular size is normal. No increase in right ventricular wall thickness. Right ventricular systolic function is normal. There is normal pulmonary artery systolic pressure. The tricuspid regurgitant velocity is 1.59 m/s, and  with an assumed right atrial pressure of 10 mmHg, the estimated right ventricular systolic pressure is 64.3 mmHg. Left Atrium: Left atrial size was normal in size. Right Atrium: Right atrial size was normal in size. Pericardium: There is no evidence of pericardial effusion. Mitral Valve: The mitral valve is normal in structure. Normal mobility of the mitral valve leaflets. Trivial mitral valve regurgitation. No evidence of mitral valve stenosis. Tricuspid Valve: The tricuspid valve is normal in structure. Tricuspid valve regurgitation is trivial. No evidence of tricuspid stenosis. Aortic Valve: The aortic valve is normal in structure.. There is mild thickening and mild calcification of the aortic valve. Aortic valve regurgitation is not visualized. Mild aortic stenosis is present. There is mild thickening of the aortic valve. There is mild calcification of the aortic valve. Aortic valve mean gradient measures 10.0 mmHg. Aortic valve peak gradient measures 19.7 mmHg. Aortic valve area, by VTI measures 1.68 cm. Pulmonic Valve: The pulmonic valve was normal in structure. Pulmonic valve regurgitation is not visualized. No evidence of pulmonic stenosis. Aorta: The aortic root is normal in size and structure. Venous: The inferior vena cava is normal in size with greater than 50% respiratory variability, suggesting right atrial pressure of 3 mmHg. IAS/Shunts: No  atrial level shunt detected by color flow Doppler.  LEFT VENTRICLE PLAX 2D LVIDd:         5.01 cm  Diastology LVIDs:         3.30 cm  LV e' lateral:   6.31 cm/s LV PW:         1.30 cm  LV E/e' lateral: 13.4 LV IVS:        1.37 cm  LV e' medial:    8.05 cm/s LVOT diam:     2.10 cm  LV E/e' medial:  10.5 LV SV:         63 LV SV Index:   27 LVOT Area:     3.46 cm  RIGHT VENTRICLE RV Basal diam:  3.37 cm RV S prime:     19.80 cm/s LEFT ATRIUM           Index       RIGHT ATRIUM           Index LA diam:  4.40 cm 1.90 cm/m  RA Area:     15.70 cm LA Vol (A4C): 44.9 ml 19.41 ml/m RA Volume:   42.90 ml  18.55 ml/m  AORTIC VALVE AV Area (Vmax):    1.49 cm AV Area (Vmean):   1.70 cm AV Area (VTI):     1.68 cm AV Vmax:           222.00 cm/s AV Vmean:          144.000 cm/s AV VTI:            0.373 m AV Peak Grad:      19.7 mmHg AV Mean Grad:      10.0 mmHg LVOT Vmax:         95.30 cm/s LVOT Vmean:        70.600 cm/s LVOT VTI:          0.181 m LVOT/AV VTI ratio: 0.49  AORTA Ao Root diam: 3.50 cm MITRAL VALVE                TRICUSPID VALVE MV Area (PHT): 5.84 cm     TR Peak grad:   10.1 mmHg MV Decel Time: 130 msec     TR Vmax:        159.00 cm/s MV E velocity: 84.80 cm/s MV A velocity: 106.00 cm/s  SHUNTS MV E/A ratio:  0.80         Systemic VTI:  0.18 m                             Systemic Diam: 2.10 cm Skeet Latch MD Electronically signed by Skeet Latch MD Signature Date/Time: 07/08/2019/12:08:49 PM    Final    US SPLEEN (ABDOMEN LIMITED)  Result Date: 07/08/2019 CLINICAL DATA:  Sickle cell disease, beta thalassemia EXAM: ULTRASOUND ABDOMEN LIMITED COMPARISON:  None. FINDINGS: Spleen atrophic, 4.4 cm length. Heterogeneous splenic echogenicity without focal mass. No LEFT upper quadrant free fluid. 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 No LEFT upper quadrant free fluid. Electronically Signed   By: Lavonia Dana M.D.   On:  07/08/2019 14:04   DG FL GUIDED LUMBAR PUNCTURE  Result Date: 07/07/2019 CLINICAL DATA:  Lumbar puncture. EXAM: DIAGNOSTIC LUMBAR PUNCTURE UNDER FLUOROSCOPIC GUIDANCE FLUOROSCOPY TIME:  Fluoroscopy Time:  0 minutes 24 seconds Radiation Exposure Index (if provided by the fluoroscopic device): 11.1 mGy PROCEDURE: After discussing the risks and benefits of this procedure with the patient's sister informed consent was obtained. Back was sterilely prepped and draped. 22 gauge needle was advanced into the L4-L5 space and clear CSF obtained. 8 cc obtained and sent in 4 separate tubes to the ordered labs. Needle withdrawn. Hemostasis achieved. No complications. IMPRESSION: Successful fluoroscopically directed lumbar puncture. Electronically Signed   By: Marcello Moores  Register   On: 07/07/2019 17:05   _0 @ DG Chest Port 1 View  Result Date: 07/08/2019 CLINICAL DATA:  Headache, aching all over, COPD, smoker EXAM: PORTABLE CHEST 1 VIEW COMPARISON:  Portable exam 1146 hours compared to 07/07/2019 FINDINGS: Tip of endotracheal tube projects 6.1 cm above carina. Nasogastric tube extends into stomach. Enlargement of cardiac silhouette. Stable mediastinal contours. Scattered infiltrates in both lungs, favor mild pulmonary edema over infection. Subsegmental atelectasis RIGHT base. No pleural effusion or pneumothorax. IMPRESSION: Enlargement of cardiac silhouette with BILATERAL pulmonary infiltrates question pulmonary edema. RIGHT basilar atelectasis. Electronically Signed   By: Crist Infante.D.  On: 07/08/2019 12:57   ECHOCARDIOGRAM COMPLETE  Result Date: 07/08/2019    ECHOCARDIOGRAM REPORT   Patient Name:   NAVEN GIAMBALVO Date of Exam: 07/08/2019 Medical Rec #:  [id number]       Height:       75.0 in Accession #:    [id number]      Weight:       226.4 lb Date of Birth:  1965/10/09        BSA:          2.313 m Patient Age:    12 years        BP:           96/62 mmHg Patient Gender: M               HR:           105 bpm.  Exam Location:  [facility] Procedure: 2D Echo and Intracardiac Opacification Agent Indications:     Acute Respiratory Insufficiency 518.82/ R06.89  History:         Patient has no prior history of Echocardiogram examinations.  Sonographer:     Arville Go RDCS Referring Phys:  [id number] Bradly Bienenstock Diagnosing Phys: Skeet Latch MD  Sonographer Comments: Technically challenging study due to limited acoustic windows, Technically difficult study due to poor echo windows, no subcostal window and echo performed with patient supine and on artificial respirator. Image acquisition challenging due to patient body habitus. IMPRESSIONS  1. Left ventricular ejection fraction, by estimation, is 55 to 60%. The left ventricle has normal function. The left ventricle has no regional wall motion abnormalities. There is mild concentric left ventricular hypertrophy. Left ventricular diastolic parameters are consistent with Grade I diastolic dysfunction (impaired relaxation).  2. Right ventricular systolic function is normal. The right ventricular size is normal. There is normal pulmonary artery systolic pressure.  3. The mitral valve is normal in structure. Trivial mitral valve regurgitation. No evidence of mitral stenosis.  4. The aortic valve is normal in structure. Aortic valve regurgitation is not visualized. Mild aortic valve stenosis. Aortic valve area, by VTI measures 1.68 cm. Aortic valve mean gradient measures 10.0 mmHg. Aortic valve Vmax measures 2.22 m/s.  5. The inferior vena cava is normal in size with greater than 50% respiratory variability, suggesting right atrial pressure of 3 mmHg. FINDINGS  Left Ventricle: Left ventricular ejection fraction, by estimation, is 55 to 60%. The left ventricle has normal function. The left ventricle has no regional wall motion abnormalities. Definity contrast agent was given IV to delineate the left ventricular  endocardial borders. The left ventricular internal cavity size was  normal in size. There is mild concentric left ventricular hypertrophy. Left ventricular diastolic parameters are consistent with Grade I diastolic dysfunction (impaired relaxation). Indeterminate filling pressures. Right Ventricle: The right ventricular size is normal. No increase in right ventricular wall thickness. Right ventricular systolic function is normal. There is normal pulmonary artery systolic pressure. The tricuspid regurgitant velocity is 1.59 m/s, and  with an assumed right atrial pressure of 10 mmHg, the estimated right ventricular systolic pressure is 35.4 mmHg. Left Atrium: Left atrial size was normal in size. Right Atrium: Right atrial size was normal in size. Pericardium: There is no evidence of pericardial effusion. Mitral Valve: The mitral valve is normal in structure. Normal mobility of the mitral valve leaflets. Trivial mitral valve regurgitation. No evidence of mitral valve stenosis. Tricuspid Valve: The tricuspid valve is normal in structure. Tricuspid valve regurgitation is trivial. No evidence  of tricuspid stenosis. Aortic Valve: The aortic valve is normal in structure.. There is mild thickening and mild calcification of the aortic valve. Aortic valve regurgitation is not visualized. Mild aortic stenosis is present. There is mild thickening of the aortic valve. There is mild calcification of the aortic valve. Aortic valve mean gradient measures 10.0 mmHg. Aortic valve peak gradient measures 19.7 mmHg. Aortic valve area, by VTI measures 1.68 cm. Pulmonic Valve: The pulmonic valve was normal in structure. Pulmonic valve regurgitation is not visualized. No evidence of pulmonic stenosis. Aorta: The aortic root is normal in size and structure. Venous: The inferior vena cava is normal in size with greater than 50% respiratory variability, suggesting right atrial pressure of 3 mmHg. IAS/Shunts: No atrial level shunt detected by color flow Doppler.  LEFT VENTRICLE PLAX 2D LVIDd:         5.01 cm   Diastology LVIDs:         3.30 cm  LV e' lateral:   6.31 cm/s LV PW:         1.30 cm  LV E/e' lateral: 13.4 LV IVS:        1.37 cm  LV e' medial:    8.05 cm/s LVOT diam:     2.10 cm  LV E/e' medial:  10.5 LV SV:         63 LV SV Index:   27 LVOT Area:     3.46 cm  RIGHT VENTRICLE RV Basal diam:  3.37 cm RV S prime:     19.80 cm/s LEFT ATRIUM           Index       RIGHT ATRIUM           Index LA diam:      4.40 cm 1.90 cm/m  RA Area:     15.70 cm LA Vol (A4C): 44.9 ml 19.41 ml/m RA Volume:   42.90 ml  18.55 ml/m  AORTIC VALVE AV Area (Vmax):    1.49 cm AV Area (Vmean):   1.70 cm AV Area (VTI):     1.68 cm AV Vmax:           222.00 cm/s AV Vmean:          144.000 cm/s AV VTI:            0.373 m AV Peak Grad:      19.7 mmHg AV Mean Grad:      10.0 mmHg LVOT Vmax:         95.30 cm/s LVOT Vmean:        70.600 cm/s LVOT VTI:          0.181 m LVOT/AV VTI ratio: 0.49  AORTA Ao Root diam: 3.50 cm MITRAL VALVE                TRICUSPID VALVE MV Area (PHT): 5.84 cm     TR Peak grad:   10.1 mmHg MV Decel Time: 130 msec     TR Vmax:        159.00 cm/s MV E velocity: 84.80 cm/s MV A velocity: 106.00 cm/s  SHUNTS MV E/A ratio:  0.80         Systemic VTI:  0.18 m                             Systemic Diam: 2.10 cm Skeet Latch MD Electronically signed by Skeet Latch MD Signature Date/Time: 07/08/2019/12:08:49 PM  Final    US SPLEEN (ABDOMEN LIMITED)  Result Date: 07/08/2019 CLINICAL DATA:  Sickle cell disease, beta thalassemia EXAM: ULTRASOUND ABDOMEN LIMITED COMPARISON:  None. FINDINGS: Spleen atrophic, 4.4 cm length. Heterogeneous splenic echogenicity without focal mass. No LEFT upper quadrant free fluid. 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 No LEFT upper quadrant free fluid. Electronically Signed   By: Lavonia Dana M.D.   On: 07/08/2019 14:04     ASSESSMENT AND PLAN    -Multidisciplinary rounds held today  Recurrent partial  seizures with status epilepticus-  - likely due to PRES - (patient with sickle cell came in with accelerated HTN prior to admission, visual deficits, severe headache with seizure activity, reversible defect on MRI, and increased protein on CSF) - will de-escalate antimicrobials per guidance from ID team -Neurology on case - appreciate input -continue dilantin and keppra  -Status post endotracheal intubation for airway protection- will plan for extubation today post weaning SBT trial.  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   -Additional etiologies include inflammatory, vaso-occlusive due to underlying sickle cell/thalassemia, infectious, vasospastic, seizure related. -Multiple consultants on case appreciate collaborationICU telemetry monitoring -Decreasing dexamethasone to 10 mg twice daily from 10 mg 4 times daily -CSF profile reviewed   Sickle cell beta thalassemia   -Hematology/oncology on case-appreciate input   -Monitor hemoglobin if less than 8 transfuse 1 unit PRBC  -No indication for Plex at this time  - there has been drop in Hb overnight, will obtain US spleen and testing for hemolysis   Right foot infected diabetic ulcer  -Status post culture with GPC positive -ID on case continue antimicrobials as per ID -Patient had MRI foot at [facility] without osteo-February 2021   ID -continue IV abx as prescibed -follow up cultures  GI/Nutrition GI PROPHYLAXIS as indicated DIET-->TF's as tolerated Constipation protocol as indicated  ENDO - ICU hypoglycemic\Hyperglycemia protocol -check FSBS per protocol   ELECTROLYTES -follow labs as needed -replace  as needed -pharmacy consultation   DVT/GI PRX ordered -SCDs  TRANSFUSIONS AS NEEDED MONITOR FSBS ASSESS the need for LABS as needed   Critical care provider statement:    Critical care time (minutes):  35   Critical care time was exclusive of:  Separately billable procedures and treating other patients   Critical care was necessary to treat or prevent imminent or life-threatening deterioration of the following conditions:   Status epilepticus, partial seizures, sickle cell anemia, thalassemia, opioid dependence, cerebritis, encephalopathy, history of illicit drug use, diabetic foot ulcers, neuropathy, multiple comorbid conditions.   Critical care was time spent personally by me on the following activities:  Development of treatment plan with patient or surrogate, discussions with consultants, evaluation of patient's response to treatment, examination of patient, obtaining history from patient or surrogate, ordering and performing treatments and interventions, ordering and review of laboratory studies and re-evaluation of patient's condition.  I assumed direction of critical care for this patient from another provider in my specialty: no    This document was prepared using Dragon voice recognition software and may include unintentional dictation errors.    Ottie Glazier, M.D.  Division of Pulmonary & [place]  [facility] - [place]

## 2019-07-09 NOTE — Progress Notes (Signed)
Subjective:   No reported seizure activity. On Keppra and dilantin. Currently off sedation since 9 AM   Objective: Current vital signs: BP 105/70   Pulse 77   Temp 97.7 F (36.5 C) (Oral)   Resp 13   Ht '6\' 3"'  (1.905 m)   Wt 102.7 kg   SpO2 93%   BMI 28.30 kg/m  Vital signs in last 24 hours: Temp:  [96.7 F (35.9 C)-98.8 F (37.1 C)] 97.7 F (36.5 C) (05/02 0859) Pulse Rate:  [74-106] 77 (05/02 0900) Resp:  [6-23] 13 (05/02 0900) BP: (93-135)/(42-105) 105/70 (05/02 0900) SpO2:  [89 %-96 %] 93 % (05/02 0900) FiO2 (%):  [40 %] 40 % (05/02 0718)  Intake/Output from previous day: 05/01 0701 - 05/02 0700 In: 4517.7 [I.V.:3110.5; IV Piggyback:1407.2] Out: 2325 [Urine:2275; Emesis/NG output:50] Intake/Output this shift: Total I/O In: 1472.5 [I.V.:472.5; NG/GT:800; IV Piggyback:200] Out: 700 [Urine:700] Nutritional status:  Diet Order    None      Neurologic Exam: Mental Status: Lethargic.  Arouses with sternal rub but provides very little conversation.  Withdraws from pain  Cranial Nerves: II: Pupils reactive bilaterally III,IV,VI: Oculocephalic response present bilaterally. NO gaze preference V,VII: corneal reflex present bilaterally Motor: Moves all extremities by withdrawing from pain    Lab Results: Basic Metabolic Panel: Recent Labs  Lab 07/04/19 1832 07/04/19 1832 07/05/19 0503 07/05/19 0503 07/07/19 0237 07/08/19 0600 07/09/19 0344  NA 129*  --  135  --  145 150* 149*  K 4.0  --  5.1  --  4.2 3.4* 4.3  CL 96*  --  105  --  114* 119* 119*  CO2 22  --  20*  --  '23 26 25  ' GLUCOSE 348*  --  348*  --  297* 160* 224*  BUN 22*  --  19  --  21* 21* 17  CREATININE 1.48*  --  1.18  --  1.02 1.08 1.25*  CALCIUM 8.9   < > 8.6*   < > 8.5* 8.3* 8.0*  MG  --   --   --   --   --  2.5*  --   PHOS  --   --   --   --   --  2.3*  --    < > = values in this interval not displayed.    Liver Function Tests: Recent Labs  Lab 07/04/19 1832 07/07/19 0237  07/08/19 1139  AST 27 12* 12*  ALT '17 15 11  ' ALKPHOS 116 80 71  BILITOT 1.6* 1.2 0.9  PROT 8.1 6.6 5.5*  ALBUMIN 3.9 2.9* 2.6*   No results for input(s): LIPASE, AMYLASE in the last 168 hours. No results for input(s): AMMONIA in the last 168 hours.  CBC: Recent Labs  Lab 07/03/19 2216 07/03/19 2216 07/04/19 1832 07/04/19 1832 07/05/19 0743 07/07/19 0237 07/08/19 0345 07/08/19 2250 07/09/19 0344  WBC 19.7*   < > 22.5*  --  13.3* 19.0* 19.2*  --  18.6*  NEUTROABS 13.6*  --  17.0*  --   --   --   --   --   --   HGB 11.1*   < > 11.6*   < > 11.2* 10.7* 7.9* 9.7* 10.7*  HCT 33.8*   < > 35.0*   < > 34.5* 32.5* 24.7* 30.6* 33.5*  MCV 65.9*   < > 65.8*  --  66.5* 66.2* 67.5*  --  69.5*  PLT 352   < > 352  --  307 283 181  --  200   < > = values in this interval not displayed.    Cardiac Enzymes: No results for input(s): CKTOTAL, CKMB, CKMBINDEX, TROPONINI in the last 168 hours.  Lipid Panel: Recent Labs  Lab 07/07/19 1435  TRIG 88    CBG: Recent Labs  Lab 07/08/19 1927 07/08/19 2349 07/09/19 0440 07/09/19 0713 07/09/19 1138  GLUCAP 122* 143* 223* 204* 151*    Microbiology: Results for orders placed or performed during the hospital encounter of 07/04/19  Blood culture (routine x 2)     Status: None   Collection Time: 07/04/19  8:24 PM   Specimen: BLOOD  Result Value Ref Range Status   Specimen Description BLOOD RIGHT ANTECUBITAL  Final   Special Requests   Final    BOTTLES DRAWN AEROBIC AND ANAEROBIC Blood Culture adequate volume   Culture   Final    NO GROWTH 5 DAYS Performed at [facility], [address], [place], [place] [address]    Report Status 07/09/2019 FINAL  Final  Blood culture (routine x 2)     Status: None   Collection Time: 07/04/19  8:27 PM   Specimen: BLOOD  Result Value Ref Range Status   Specimen Description BLOOD LEFT ANTECUBITAL  Final   Special Requests   Final    BOTTLES DRAWN AEROBIC AND ANAEROBIC Blood Culture  adequate volume   Culture   Final    NO GROWTH 5 DAYS Performed at [facility], [address] [place], [place] [address]    Report Status 07/09/2019 FINAL  Final  Respiratory Panel by RT PCR (Flu A&B, Covid) - Nasopharyngeal Swab     Status: None   Collection Time: 07/04/19  9:34 PM   Specimen: Nasopharyngeal Swab  Result Value Ref Range Status   SARS Coronavirus 2 by RT PCR NEGATIVE NEGATIVE Final    Comment: (NOTE) SARS-CoV-2 target nucleic acids are NOT DETECTED. The SARS-CoV-2 RNA is generally detectable in upper respiratoy specimens during the acute phase of infection. The lowest concentration of SARS-CoV-2 viral copies this assay can detect is 131 copies/mL. A negative result does not preclude SARS-Cov-2 infection and should not be used as the sole basis for treatment or other patient management decisions. A negative result may occur with  improper specimen collection/handling, submission of specimen other than nasopharyngeal swab, presence of viral mutation(s) within the areas targeted by this assay, and inadequate number of viral copies (<131 copies/mL). A negative result must be combined with clinical observations, patient history, and epidemiological information. The expected result is Negative. Fact Sheet for Patients:  PinkCheek.be Fact Sheet for Healthcare Providers:  GravelBags.it This test is not yet ap proved or cleared by the [place] FDA and  has been authorized for detection and/or diagnosis of SARS-CoV-2 by FDA under an Emergency Use Authorization (EUA). This EUA will remain  in effect (meaning this test can be used) for the duration of the COVID-19 declaration under Section 564(b)(1) of the Act, 21 U.S.C. section 360bbb-3(b)(1), unless the authorization is terminated or revoked sooner.    Influenza A by PCR NEGATIVE NEGATIVE Final   Influenza B by PCR NEGATIVE NEGATIVE Final     Comment: (NOTE) The Xpert Xpress SARS-CoV-2/FLU/RSV assay is intended as an aid in  the diagnosis of influenza from Nasopharyngeal swab specimens and  should not be used as a sole basis for treatment. Nasal washings and  aspirates are unacceptable for Xpert Xpress SARS-CoV-2/FLU/RSV  testing. Fact Sheet for  Patients: PinkCheek.be Fact Sheet for Healthcare Providers: GravelBags.it This test is not yet approved or cleared by the [place] FDA and  has been authorized for detection and/or diagnosis of SARS-CoV-2 by  FDA under an Emergency Use Authorization (EUA). This EUA will remain  in effect (meaning this test can be used) for the duration of the  Covid-19 declaration under Section 564(b)(1) of the Act, 21  U.S.C. section 360bbb-3(b)(1), unless the authorization is  terminated or revoked. Performed at [facility], [address] [place], [place] [address]   MRSA PCR Screening     Status: None   Collection Time: 07/05/19  2:54 AM   Specimen: Nasal Mucosa; Nasopharyngeal  Result Value Ref Range Status   MRSA by PCR NEGATIVE NEGATIVE Final    Comment:        The GeneXpert MRSA Assay (FDA approved for NASAL specimens only), is one component of a comprehensive MRSA colonization surveillance program. It is not intended to diagnose MRSA infection nor to guide or monitor treatment for MRSA infections. Performed at [facility], [address], [place], [place] [address]   Aerobic Culture (superficial specimen)     Status: None (Preliminary result)   Collection Time: 07/06/19  8:32 PM   Specimen: Wound  Result Value Ref Range Status   Specimen Description   Final    WOUND Performed at [facility], [address], [place], [place] [address]    Special Requests   Final    NONE Performed at [facility], [address] [place], [place] [address]    Gram Stain   Final    RARE  WBC PRESENT, PREDOMINANTLY PMN RARE GRAM POSITIVE COCCI IN PAIRS    Culture   Final    MODERATE DIPHTHEROIDS(CORYNEBACTERIUM SPECIES) Standardized susceptibility testing for this organism is not available. CULTURE REINCUBATED FOR BETTER GROWTH Performed at [facility], [place] [address] [place], [place] [address]    Report Status PENDING  Incomplete  Anaerobic culture     Status: None (Preliminary result)   Collection Time: 07/07/19  4:26 PM   Specimen: PATH Cytology CSF; Cerebrospinal Fluid  Result Value Ref Range Status   Specimen Description   Final    CSF Performed at [facility], [address], [place], [place] [address]    Special Requests   Final    NONE Performed at [facility], [address] [place], [place] [address]    Gram Stain   Final    NO WBC SEEN NO ORGANISMS SEEN CYTOSPIN SMEAR Performed at [facility], [place] [address], [place], [place] [address]    Culture PENDING  Incomplete   Report Status PENDING  Incomplete  CSF culture     Status: None (Preliminary result)   Collection Time: 07/07/19  4:26 PM   Specimen: PATH Cytology CSF; Cerebrospinal Fluid  Result Value Ref Range Status   Specimen Description CSF  Final   Special Requests NONE  Final   Gram Stain   Final    NO ORGANISMS SEEN WBC SEEN RED BLOOD CELLS PRESENT Performed at [facility], [address] [place], [place] [address]    Culture PENDING  Incomplete   Report Status PENDING  Incomplete  Culture, fungus without smear     Status: None (Preliminary result)   Collection Time: 07/07/19  4:26 PM   Specimen: PATH Cytology CSF; Cerebrospinal Fluid  Result Value Ref Range Status   Specimen Description  Final    CSF Performed at [facility], [address] [place], [place] [address]    Special Requests   Final    NONE Performed at [facility], [address] [place], [place] [address]    Culture   Final    NO FUNGUS  ISOLATED AFTER 1 DAY Performed at [facility], [place] [address], [place], [place] [address]    Report Status PENDING  Incomplete  CULTURE, BLOOD (ROUTINE X 2) w Reflex to ID Panel     Status: None (Preliminary result)   Collection Time: 07/08/19  5:30 AM   Specimen: BLOOD  Result Value Ref Range Status   Specimen Description BLOOD BLOOD LEFT HAND  Final   Special Requests   Final    BOTTLES DRAWN AEROBIC AND ANAEROBIC Blood Culture adequate volume   Culture   Final    NO GROWTH 1 DAY Performed at [facility], [address] [place], [place] [address]    Report Status PENDING  Incomplete  Urine Culture     Status: None   Collection Time: 07/08/19  5:46 AM   Specimen: Urine, Random  Result Value Ref Range Status   Specimen Description   Final    URINE, RANDOM Performed at [facility], [address] [place], [place] [address]    Special Requests   Final    NONE Performed at [facility], [address] [place], [place] [address]    Culture   Final    NO GROWTH Performed at [facility], [place] [address], [place], [place] [address]    Report Status 07/09/2019 FINAL  Final  CULTURE, BLOOD (ROUTINE X 2) w Reflex to ID Panel     Status: None (Preliminary result)   Collection Time: 07/08/19 11:05 AM   Specimen: BLOOD  Result Value Ref Range Status   Specimen Description BLOOD BLOOD RIGHT HAND  Final   Special Requests   Final    BOTTLES DRAWN AEROBIC AND ANAEROBIC Blood Culture adequate volume   Culture   Final    NO GROWTH < 24 HOURS Performed at [facility], [address], [place], [place] [address]    Report Status PENDING  Incomplete    Coagulation Studies: Recent Labs    07/07/19 1428  LABPROT 13.7  INR 1.1    Imaging: EEG  Result Date: 07/07/2019 Alexis Goodell, MD     07/07/2019  2:03 PM ELECTROENCEPHALOGRAM REPORT Patient: John Haas       Room #: IC13A-AA EEG No. ID: [id number] Age: 54 y.o.         Sex: male Requesting Physician: Swayze Report Date:  07/07/2019       Interpreting Physician: Alexis Goodell History: Cara Aguino is an 54 y.o. male with altered mental status and seizures Medications: Keppra, Dilantin, Acyclovir, Omnipen, Rocephin, Decadron, Insulin, Ativan Conditions of Recording:  This is a 21 channel routine scalp EEG performed with bipolar and monopolar montages arranged in accordance to the international 10/20 system of electrode placement. One channel was dedicated to EKG recording. The patient is in the altered state state. Description:  The background activity is slow and poorly organized.  It consists of a polymorphic delta activity that is diffusely distributed.  There is superimposed fast beta activity noted as well.  There is no noted hemispheric asymmetry. There is no noted epileptiform activity.  There are no incidences of eye deviation or head turning.  Hyperventilation and intermittent  photic stimulation were not performed. IMPRESSION: This is an abnormal EEG secondary to general background slowing.  This finding may be seen with a diffuse disturbance that is etiologically nonspecific, but may include a metabolic encephalopathy, among other possibilities.  No epileptiform activity was noted.  The superimposed beta activity noted is consistent with medication effect.  Alexis Goodell, MD Neurology [phone] 07/07/2019, 1:57 PM   DG Abd 1 View  Result Date: 07/07/2019 CLINICAL DATA:  Orogastric tube placement. EXAM: ABDOMEN - 1 VIEW COMPARISON:  Same day. FINDINGS: The bowel gas pattern is normal. Distal tip of enteric tube is seen in expected position of proximal stomach. No radio-opaque calculi or other significant radiographic abnormality are seen. IMPRESSION: Distal tip of enteric tube seen in expected position of proximal stomach. Electronically Signed   By: Marijo Conception M.D.   On: 07/07/2019 15:18   DG Abd 1 View  Result Date: 07/07/2019 CLINICAL DATA:   Orogastric tube placement. EXAM: ABDOMEN - 1 VIEW COMPARISON:  None. FINDINGS: The bowel gas pattern is normal. Distal tip of enteric tube is seen in expected position of distal esophagus. No radio-opaque calculi or other significant radiographic abnormality are seen. IMPRESSION: Distal tip of enteric tube is seen in expected position of distal esophagus. No evidence of bowel obstruction or ileus. Electronically Signed   By: Marijo Conception M.D.   On: 07/07/2019 13:50   MR BRAIN W WO CONTRAST  Result Date: 07/08/2019 CLINICAL DATA:  Seizures. EXAM: MRI HEAD WITHOUT AND WITH CONTRAST TECHNIQUE: Multiplanar, multiecho pulse sequences of the brain and surrounding structures were obtained without and with intravenous contrast. CONTRAST:  67m GADAVIST GADOBUTROL 1 MMOL/ML IV SOLN COMPARISON:  Brain MRI 07/06/2019 FINDINGS: Brain: Cortical diffusion abnormality within the right hemisphere has resolved aside from a small area in the right occipital lobe. Areas of encephalomalacia in both frontal lobes are unchanged. There is mild multifocal white matter hyperintensity. There is an old left cerebellar infarct. No chronic microhemorrhage. Normal midline structures. 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 Skull and upper cervical spine: Normal marrow signal. Sinuses/Orbits: Negative. Other: None. IMPRESSION: 1. No acute intracranial abnormality. 2. Cortical diffusion abnormality within the right hemisphere has resolved aside from a small area in the right occipital lobe. This may indicate postictal change or small area of subacute ischemia. 3. Unchanged areas of encephalomalacia in the frontal lobes bilaterally. Old left cerebellar infarct. Electronically Signed   By: KUlyses JarredM.D.   On: 07/08/2019 00:45   DG Chest Port 1 View  Result Date: 07/08/2019 CLINICAL DATA:  Headache, aching all over, COPD, smoker EXAM: PORTABLE CHEST 1 VIEW COMPARISON:  Portable exam 1146  hours compared to 07/07/2019 FINDINGS: Tip of endotracheal tube projects 6.1 cm above carina. Nasogastric tube extends into stomach. Enlargement of cardiac silhouette. Stable mediastinal contours. Scattered infiltrates in both lungs, favor mild pulmonary edema over infection. Subsegmental atelectasis RIGHT base. No pleural effusion or pneumothorax. IMPRESSION: Enlargement of cardiac silhouette with BILATERAL pulmonary infiltrates question pulmonary edema. RIGHT basilar atelectasis. Electronically Signed   By: MLavonia DanaM.D.   On: 07/08/2019 12:57   DG Chest Port 1 View  Result Date: 07/07/2019 CLINICAL DATA:  Central line placement EXAM: PORTABLE CHEST 1 VIEW COMPARISON:  Radiographs 09/19/2018 FINDINGS: Endotracheal tube terminates in the mid trachea, 4.5 cm from the carina. Left IJ approach central venous catheter tip terminates at the brachiocephalic-caval confluence. Transesophageal tube tip again seen terminating in the proximal stomach  with the side port at the level of the GE junction. Telemetry leads overlie the chest. Stable cardiomegaly with central vascular congestion and diffuse mixed interstitial and patchy airspace opacity concerning for interstitial and alveolar pulmonary edema. Hazy obscuration of the right hemidiaphragm may reflect some trace effusion. No pneumothorax. No acute osseous or soft tissue abnormality. Sclerotic changes of the left humeral head likely reflect sequela of prior avascular necrosis. Additional degenerative changes in the left shoulder and spine. IMPRESSION: 1. Left IJ approach central venous catheter tip at the left brachiocephalic-caval confluence. 2. Endotracheal tube terminates in the mid trachea, 4.5 cm from the carina. 3. Transesophageal tube tip again seen terminating in the proximal stomach with the side port at the level of the GE junction. Consider advancing 3 cm to position in the gastric body. 4. Persistent features suggestive of interstitial and alveolar  edema. Likely developing right pleural effusion. Electronically Signed   By: Lovena Le M.D.   On: 07/07/2019 22:32   Portable Chest x-ray  Result Date: 07/07/2019 CLINICAL DATA:  Endotracheal tube placement. EXAM: PORTABLE CHEST 1 VIEW COMPARISON:  July 04, 2019. FINDINGS: Stable cardiomegaly with central pulmonary vascular congestion. Endotracheal and nasogastric tubes are unchanged in position. Increased bilateral interstitial densities are noted concerning for pulmonary edema. No pneumothorax or pleural effusion is noted. Bony thorax is unremarkable. IMPRESSION: Stable support apparatus. Stable cardiomegaly with central pulmonary vascular congestion. Increased bilateral interstitial densities are noted concerning for pulmonary edema. Electronically Signed   By: Marijo Conception M.D.   On: 07/07/2019 13:50   ECHOCARDIOGRAM COMPLETE  Result Date: 07/08/2019    ECHOCARDIOGRAM REPORT   Patient Name:   ZACKARIE CHASON Date of Exam: 07/08/2019 Medical Rec #:  [id number]       Height:       75.0 in Accession #:    [id number]      Weight:       226.4 lb Date of Birth:  01/09/1966        BSA:          2.313 m Patient Age:    48 years        BP:           96/62 mmHg Patient Gender: M               HR:           105 bpm. Exam Location:  [facility] Procedure: 2D Echo and Intracardiac Opacification Agent Indications:     Acute Respiratory Insufficiency 518.82/ R06.89  History:         Patient has no prior history of Echocardiogram examinations.  Sonographer:     Arville Go RDCS Referring Phys:  [id number] Bradly Bienenstock Diagnosing Phys: Skeet Latch MD  Sonographer Comments: Technically challenging study due to limited acoustic windows, Technically difficult study due to poor echo windows, no subcostal window and echo performed with patient supine and on artificial respirator. Image acquisition challenging due to patient body habitus. IMPRESSIONS  1. Left ventricular ejection fraction, by estimation, is 55 to 60%. The  left ventricle has normal function. The left ventricle has no regional wall motion abnormalities. There is mild concentric left ventricular hypertrophy. Left ventricular diastolic parameters are consistent with Grade I diastolic dysfunction (impaired relaxation).  2. Right ventricular systolic function is normal. The right ventricular size is normal. There is normal pulmonary artery systolic pressure.  3. The mitral valve is normal in structure. Trivial mitral valve regurgitation. No evidence of mitral stenosis.  4. The aortic valve is normal in structure. Aortic valve regurgitation is not visualized. Mild aortic valve stenosis. Aortic valve area, by VTI measures 1.68 cm. Aortic valve mean gradient measures 10.0 mmHg. Aortic valve Vmax measures 2.22 m/s.  5. The inferior vena cava is normal in size with greater than 50% respiratory variability, suggesting right atrial pressure of 3 mmHg. FINDINGS  Left Ventricle: Left ventricular ejection fraction, by estimation, is 55 to 60%. The left ventricle has normal function. The left ventricle has no regional wall motion abnormalities. Definity contrast agent was given IV to delineate the left ventricular  endocardial borders. The left ventricular internal cavity size was normal in size. There is mild concentric left ventricular hypertrophy. Left ventricular diastolic parameters are consistent with Grade I diastolic dysfunction (impaired relaxation). Indeterminate filling pressures. Right Ventricle: The right ventricular size is normal. No increase in right ventricular wall thickness. Right ventricular systolic function is normal. There is normal pulmonary artery systolic pressure. The tricuspid regurgitant velocity is 1.59 m/s, and  with an assumed right atrial pressure of 10 mmHg, the estimated right ventricular systolic pressure is 54.0 mmHg. Left Atrium: Left atrial size was normal in size. Right Atrium: Right atrial size was normal in size. Pericardium: There is no  evidence of pericardial effusion. Mitral Valve: The mitral valve is normal in structure. Normal mobility of the mitral valve leaflets. Trivial mitral valve regurgitation. No evidence of mitral valve stenosis. Tricuspid Valve: The tricuspid valve is normal in structure. Tricuspid valve regurgitation is trivial. No evidence of tricuspid stenosis. Aortic Valve: The aortic valve is normal in structure.. There is mild thickening and mild calcification of the aortic valve. Aortic valve regurgitation is not visualized. Mild aortic stenosis is present. There is mild thickening of the aortic valve. There is mild calcification of the aortic valve. Aortic valve mean gradient measures 10.0 mmHg. Aortic valve peak gradient measures 19.7 mmHg. Aortic valve area, by VTI measures 1.68 cm. Pulmonic Valve: The pulmonic valve was normal in structure. Pulmonic valve regurgitation is not visualized. No evidence of pulmonic stenosis. Aorta: The aortic root is normal in size and structure. Venous: The inferior vena cava is normal in size with greater than 50% respiratory variability, suggesting right atrial pressure of 3 mmHg. IAS/Shunts: No atrial level shunt detected by color flow Doppler.  LEFT VENTRICLE PLAX 2D LVIDd:         5.01 cm  Diastology LVIDs:         3.30 cm  LV e' lateral:   6.31 cm/s LV PW:         1.30 cm  LV E/e' lateral: 13.4 LV IVS:        1.37 cm  LV e' medial:    8.05 cm/s LVOT diam:     2.10 cm  LV E/e' medial:  10.5 LV SV:         63 LV SV Index:   27 LVOT Area:     3.46 cm  RIGHT VENTRICLE RV Basal diam:  3.37 cm RV S prime:     19.80 cm/s LEFT ATRIUM           Index       RIGHT ATRIUM           Index LA diam:      4.40 cm 1.90 cm/m  RA Area:     15.70 cm LA Vol (A4C): 44.9 ml 19.41 ml/m RA Volume:   42.90 ml  18.55 ml/m  AORTIC VALVE AV Area (  Vmax):    1.49 cm AV Area (Vmean):   1.70 cm AV Area (VTI):     1.68 cm AV Vmax:           222.00 cm/s AV Vmean:          144.000 cm/s AV VTI:            0.373 m AV  Peak Grad:      19.7 mmHg AV Mean Grad:      10.0 mmHg LVOT Vmax:         95.30 cm/s LVOT Vmean:        70.600 cm/s LVOT VTI:          0.181 m LVOT/AV VTI ratio: 0.49  AORTA Ao Root diam: 3.50 cm MITRAL VALVE                TRICUSPID VALVE MV Area (PHT): 5.84 cm     TR Peak grad:   10.1 mmHg MV Decel Time: 130 msec     TR Vmax:        159.00 cm/s MV E velocity: 84.80 cm/s MV A velocity: 106.00 cm/s  SHUNTS MV E/A ratio:  0.80         Systemic VTI:  0.18 m                             Systemic Diam: 2.10 cm Skeet Latch MD Electronically signed by Skeet Latch MD Signature Date/Time: 07/08/2019/12:08:49 PM    Final    US SPLEEN (ABDOMEN LIMITED)  Result Date: 07/08/2019 CLINICAL DATA:  Sickle cell disease, beta thalassemia EXAM: ULTRASOUND ABDOMEN LIMITED COMPARISON:  None. FINDINGS: Spleen atrophic, 4.4 cm length. Heterogeneous splenic echogenicity without focal mass. No LEFT upper quadrant free fluid. 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 No LEFT upper quadrant free fluid. Electronically Signed   By: Lavonia Dana M.D.   On: 07/08/2019 14:04   DG FL GUIDED LUMBAR PUNCTURE  Result Date: 07/07/2019 CLINICAL DATA:  Lumbar puncture. EXAM: DIAGNOSTIC LUMBAR PUNCTURE UNDER FLUOROSCOPIC GUIDANCE FLUOROSCOPY TIME:  Fluoroscopy Time:  0 minutes 24 seconds Radiation Exposure Index (if provided by the fluoroscopic device): 11.1 mGy PROCEDURE: After discussing the risks and benefits of this procedure with the patient's sister informed consent was obtained. Back was sterilely prepped and draped. 22 gauge needle was advanced into the L4-L5 space and clear CSF obtained. 8 cc obtained and sent in 4 separate tubes to the ordered labs. Needle withdrawn. Hemostasis achieved. No complications. IMPRESSION: Successful fluoroscopically directed lumbar puncture. Electronically Signed   By: Marcello Moores  Register   On: 07/07/2019 17:05    Medications:   I have reviewed the patient's current medications. Scheduled: . chlorhexidine gluconate (MEDLINE KIT)  15 mL Mouth Rinse BID  . Chlorhexidine Gluconate Cloth  6 each Topical Daily  . dexamethasone (DECADRON) injection  10 mg Intravenous Q12H  . dexmedetomidine  1 mcg/kg Intravenous Once  . folic acid  1 mg Intravenous Daily  . free water  200 mL Per Tube Q4H  . insulin aspart  0-20 Units Subcutaneous Q4H  . mouth rinse  15 mL Mouth Rinse 10 times per day  . nystatin  5 mL Oral QID  . pantoprazole sodium  40 mg Per Tube Daily  . senna-docusate  2 tablet Per Tube BID  . sodium chloride flush  10-40 mL Intracatheter Q12H  . tamsulosin  0.4  mg Oral Daily  . thiamine injection  100 mg Intravenous Daily    Assessment/Plan: 54 year old malewith medical history significant forsickle thalassemia, COPD, diabetes and hypertension who presented to the emergency room with a complaint of headache and aching all over.In the ED altered at times and withintermittent shaking of his left arm and leg with deviation of his head to the leftwhile awake. Felt to be partial seizures and started on Keppra. Patient hypothermic and with elevated white blood cell count. Due to possibility of CNS infection patient started on broad spectrum antibiotics to include Acyclovir, Ampicillin and Ceftriaxone. Decadron also initiated.   LP done with elevated cells even though had elevated RBC. Has elevated ration of WBC/RBC to point towards suspected infection  On broad spectrum antibiotics.  EEG reassuring on Friday  Agree with holding sedation to see if any improvement and wakefullnes Con't dilantin and Keppra Will likely repeat EEG tomorrow Will follow.

## 2019-07-10 ENCOUNTER — Inpatient Hospital Stay: Payer: Medicare Other

## 2019-07-10 DIAGNOSIS — J209 Acute bronchitis, unspecified: Secondary | ICD-10-CM | POA: Diagnosis not present

## 2019-07-10 DIAGNOSIS — G039 Meningitis, unspecified: Secondary | ICD-10-CM | POA: Diagnosis not present

## 2019-07-10 DIAGNOSIS — J9601 Acute respiratory failure with hypoxia: Secondary | ICD-10-CM

## 2019-07-10 DIAGNOSIS — J440 Chronic obstructive pulmonary disease with acute lower respiratory infection: Secondary | ICD-10-CM

## 2019-07-10 DIAGNOSIS — D649 Anemia, unspecified: Secondary | ICD-10-CM

## 2019-07-10 LAB — PROTEIN ELECTROPHORESIS, SERUM
A/G Ratio: 0.9 (ref 0.7–1.7)
Albumin ELP: 2.9 g/dL (ref 2.9–4.4)
Alpha-1-Globulin: 0.3 g/dL (ref 0.0–0.4)
Alpha-2-Globulin: 0.6 g/dL (ref 0.4–1.0)
Beta Globulin: 1 g/dL (ref 0.7–1.3)
Gamma Globulin: 1.2 g/dL (ref 0.4–1.8)
Globulin, Total: 3.1 g/dL (ref 2.2–3.9)
Total Protein ELP: 6 g/dL (ref 6.0–8.5)

## 2019-07-10 LAB — TYPE AND SCREEN
ABO/RH(D): O NEG
Antibody Screen: NEGATIVE
Unit division: 0
Unit division: 0
Unit division: 0

## 2019-07-10 LAB — BPAM RBC
Blood Product Expiration Date: 202105042359
Blood Product Expiration Date: 202105222359
Blood Product Expiration Date: 202105222359
ISSUE DATE / TIME: 202105011642
Unit Type and Rh: 9500
Unit Type and Rh: 9500
Unit Type and Rh: 9500

## 2019-07-10 LAB — BASIC METABOLIC PANEL
Anion gap: 5 (ref 5–15)
BUN: 16 mg/dL (ref 6–20)
CO2: 26 mmol/L (ref 22–32)
Calcium: 8.1 mg/dL — ABNORMAL LOW (ref 8.9–10.3)
Chloride: 115 mmol/L — ABNORMAL HIGH (ref 98–111)
Creatinine, Ser: 0.98 mg/dL (ref 0.61–1.24)
GFR calc Af Amer: >60 mL/min (ref >60)
GFR calc non Af Amer: >60 mL/min (ref >60)
Glucose, Bld: 199 mg/dL — ABNORMAL HIGH (ref 70–99)
Potassium: 4.1 mmol/L (ref 3.5–5.1)
Sodium: 146 mmol/L — ABNORMAL HIGH (ref 135–145)

## 2019-07-10 LAB — ANCA TITERS
Atypical P-ANCA titer: <1:20 {titer}
C-ANCA: <1:20 {titer}
P-ANCA: <1:20 {titer}

## 2019-07-10 LAB — BLOOD GAS, ARTERIAL
Acid-Base Excess: 3.2 mmol/L — ABNORMAL HIGH (ref 0.0–2.0)
Bicarbonate: 27.9 mmol/L (ref 20.0–28.0)
FIO2: 0.4
MECHVT: 500 mL
Mechanical Rate: 16
O2 Saturation: 98.7 %
PEEP: 5 cmH2O
Patient temperature: 37
RATE: 16 resp/min
pCO2 arterial: 42 mmHg (ref 32.0–48.0)
pH, Arterial: 7.43 (ref 7.350–7.450)
pO2, Arterial: 119 mmHg — ABNORMAL HIGH (ref 83.0–108.0)

## 2019-07-10 LAB — GLUCOSE, CAPILLARY
Glucose-Capillary: 178 mg/dL — ABNORMAL HIGH (ref 70–99)
Glucose-Capillary: 180 mg/dL — ABNORMAL HIGH (ref 70–99)
Glucose-Capillary: 120 mg/dL — ABNORMAL HIGH (ref 70–99)
Glucose-Capillary: 231 mg/dL — ABNORMAL HIGH (ref 70–99)
Glucose-Capillary: 194 mg/dL — ABNORMAL HIGH (ref 70–99)

## 2019-07-10 LAB — CBC
HCT: 31.7 % — ABNORMAL LOW (ref 39.0–52.0)
Hemoglobin: 10.1 g/dL — ABNORMAL LOW (ref 13.0–17.0)
MCH: 22.1 pg — ABNORMAL LOW (ref 26.0–34.0)
MCHC: 31.9 g/dL (ref 30.0–36.0)
MCV: 69.4 fL — ABNORMAL LOW (ref 80.0–100.0)
Platelets: 173 10*3/uL (ref 150–400)
RBC: 4.57 MIL/uL (ref 4.22–5.81)
RDW: 18.6 % — ABNORMAL HIGH (ref 11.5–15.5)
WBC: 19.6 10*3/uL — ABNORMAL HIGH (ref 4.0–10.5)
nRBC: 1.9 % — ABNORMAL HIGH (ref 0.0–0.2)

## 2019-07-10 LAB — AEROBIC CULTURE W GRAM STAIN (SUPERFICIAL SPECIMEN)

## 2019-07-10 LAB — HAPTOGLOBIN: Haptoglobin: 147 mg/dL (ref 29–370)

## 2019-07-10 LAB — MAGNESIUM: Magnesium: 1.9 mg/dL (ref 1.7–2.4)

## 2019-07-10 LAB — PROCALCITONIN: Procalcitonin: <0.1 ng/mL

## 2019-07-10 LAB — VDRL, CSF: VDRL Quant, CSF: NONREACTIVE

## 2019-07-10 LAB — PHOSPHORUS: Phosphorus: 3.5 mg/dL (ref 2.5–4.6)

## 2019-07-10 LAB — TRIGLYCERIDES: Triglycerides: 78 mg/dL (ref <150)

## 2019-07-10 LAB — PREPARE RBC (CROSSMATCH)

## 2019-07-10 LAB — LACTIC ACID, PLASMA: Lactic Acid, Venous: 1.1 mmol/L (ref 0.5–1.9)

## 2019-07-10 MED ORDER — SODIUM CHLORIDE 0.9% FLUSH: 9.0000 mL | INTRAVENOUS | Status: DC | PRN

## 2019-07-10 MED ORDER — HALOPERIDOL LACTATE 5 MG/ML IJ SOLN: 5.0000 mg | Freq: Once | INTRAMUSCULAR | Status: AC

## 2019-07-10 MED ORDER — ONDANSETRON HCL 4 MG/2ML IJ SOLN
4.0000 mg | Freq: Four times a day (QID) | INTRAMUSCULAR | Status: DC | PRN
  Administered 2019-07-11 – 2019-07-12 (×2): 4 mg via INTRAVENOUS
  Filled 2019-07-10 (×2): qty 2

## 2019-07-10 MED ORDER — NICOTINE 21 MG/24HR TD PT24
21.0000 mg | MEDICATED_PATCH | Freq: Every day | TRANSDERMAL | Status: DC
  Administered 2019-07-10: 21 mg via TRANSDERMAL
  Filled 2019-07-10: qty 1

## 2019-07-10 MED ORDER — DIPHENHYDRAMINE HCL 50 MG/ML IJ SOLN
12.5000 mg | Freq: Four times a day (QID) | INTRAMUSCULAR | Status: DC | PRN
  Administered 2019-07-10 – 2019-07-12 (×4): 12.5 mg via INTRAVENOUS
  Filled 2019-07-10 (×3): qty 1

## 2019-07-10 MED ORDER — LORAZEPAM 2 MG/ML IJ SOLN: 1.0000 mg | Freq: Once | INTRAMUSCULAR | Status: AC

## 2019-07-10 MED ORDER — OXYCODONE HCL 5 MG PO TABS
5.0000 mg | ORAL_TABLET | ORAL | Status: DC
  Administered 2019-07-10: 5 mg via ORAL
  Filled 2019-07-10: qty 1

## 2019-07-10 MED ORDER — HYDROMORPHONE 1 MG/ML IV SOLN
INTRAVENOUS | Status: DC
  Filled 2019-07-10: qty 25

## 2019-07-10 MED ORDER — CHLORHEXIDINE GLUCONATE 0.12 % MT SOLN
OROMUCOSAL | Status: AC
  Filled 2019-07-10: qty 15

## 2019-07-10 MED ORDER — NALOXONE HCL 0.4 MG/ML IJ SOLN: 0.4000 mg | INTRAMUSCULAR | Status: DC | PRN

## 2019-07-10 MED ORDER — DEXAMETHASONE SODIUM PHOSPHATE 10 MG/ML IJ SOLN
10.0000 mg | Freq: Every day | INTRAMUSCULAR | Status: DC
  Administered 2019-07-11: 10 mg via INTRAVENOUS
  Filled 2019-07-10: qty 1

## 2019-07-10 MED ORDER — FOLIC ACID 1 MG PO TABS
1.0000 mg | ORAL_TABLET | Freq: Every day | ORAL | Status: DC
  Administered 2019-07-10: 1 mg
  Filled 2019-07-10: qty 1

## 2019-07-10 MED ORDER — DIPHENHYDRAMINE HCL 12.5 MG/5ML PO ELIX
12.5000 mg | ORAL_SOLUTION | Freq: Four times a day (QID) | ORAL | Status: DC | PRN
  Filled 2019-07-10: qty 5

## 2019-07-10 MED ORDER — HYDROMORPHONE 1 MG/ML IV SOLN
INTRAVENOUS | Status: DC
  Administered 2019-07-10: 25 mg via INTRAVENOUS
  Filled 2019-07-10: qty 25

## 2019-07-10 MED ORDER — HALOPERIDOL LACTATE 5 MG/ML IJ SOLN
INTRAMUSCULAR | Status: AC
  Administered 2019-07-10: 5 mg via INTRAVENOUS
  Filled 2019-07-10: qty 1

## 2019-07-10 MED ORDER — HYDROMORPHONE HCL 1 MG/ML IJ SOLN
2.0000 mg | Freq: Once | INTRAMUSCULAR | Status: AC
  Administered 2019-07-10: 2 mg via INTRAVENOUS

## 2019-07-10 MED ORDER — HYDROMORPHONE HCL 1 MG/ML IJ SOLN
INTRAMUSCULAR | Status: AC
  Filled 2019-07-10: qty 2

## 2019-07-10 MED ORDER — FOLIC ACID 1 MG PO TABS: 1.0000 mg | ORAL_TABLET | Freq: Every day | ORAL | Status: DC

## 2019-07-10 MED ORDER — LORAZEPAM 2 MG/ML IJ SOLN
INTRAMUSCULAR | Status: AC
  Administered 2019-07-10: 1 mg via INTRAVENOUS
  Filled 2019-07-10: qty 1

## 2019-07-10 MED ORDER — THIAMINE HCL 100 MG PO TABS: 100.0000 mg | ORAL_TABLET | Freq: Every day | ORAL | Status: DC

## 2019-07-10 MED ORDER — HYDRALAZINE HCL 20 MG/ML IJ SOLN
10.0000 mg | Freq: Four times a day (QID) | INTRAMUSCULAR | Status: DC | PRN
  Administered 2019-07-10: 20 mg via INTRAVENOUS
  Filled 2019-07-10: qty 1

## 2019-07-10 MED ORDER — THIAMINE HCL 100 MG PO TABS
100.0000 mg | ORAL_TABLET | Freq: Every day | ORAL | Status: DC
  Administered 2019-07-10: 100 mg
  Filled 2019-07-10: qty 1

## 2019-07-10 MED ORDER — DEXMEDETOMIDINE HCL IN NACL 400 MCG/100ML IV SOLN
0.4000 ug/kg/h | INTRAVENOUS | Status: DC
  Administered 2019-07-10 – 2019-07-11 (×6): 1.2 ug/kg/h via INTRAVENOUS
  Filled 2019-07-10 (×6): qty 100

## 2019-07-10 MED ORDER — PANTOPRAZOLE SODIUM 40 MG IV SOLR
40.0000 mg | Freq: Every day | INTRAVENOUS | Status: DC
  Administered 2019-07-11 – 2019-07-12 (×2): 40 mg via INTRAVENOUS
  Filled 2019-07-10 (×2): qty 40

## 2019-07-10 MED ORDER — CLONIDINE HCL 0.3 MG/24HR TD PTWK
0.3000 mg | MEDICATED_PATCH | TRANSDERMAL | Status: DC
  Administered 2019-07-10: 0.3 mg via TRANSDERMAL
  Filled 2019-07-10: qty 1

## 2019-07-10 MED ORDER — HYDROMORPHONE 1 MG/ML IV SOLN
INTRAVENOUS | Status: DC
  Filled 2019-07-10 (×2): qty 25

## 2019-07-10 NOTE — Progress Notes (Signed)
Date of Admission:  07/04/2019        Extubated sedated   Medications:  . chlorhexidine      . chlorhexidine gluconate (MEDLINE KIT)  15 mL Mouth Rinse BID  . Chlorhexidine Gluconate Cloth  6 each Topical Daily  . dexamethasone (DECADRON) injection  10 mg Intravenous Q12H  . folic acid  1 mg Per Tube Daily  . free water  200 mL Per Tube Q4H  . insulin aspart  0-20 Units Subcutaneous Q4H  . mouth rinse  15 mL Mouth Rinse 10 times per day  . nystatin  5 mL Oral QID  . oxyCODONE  5 mg Oral Q4H  . pantoprazole sodium  40 mg Per Tube Daily  . senna-docusate  2 tablet Per Tube BID  . sodium chloride flush  10-40 mL Intracatheter Q12H  . thiamine  100 mg Per Tube Daily    Objective: Vital signs in last 24 hours: Temp:  [97.7 F (36.5 C)-99.1 F (37.3 C)] 98 F (36.7 C) (05/03 0744) Pulse Rate:  [73-115] 78 (05/03 0800) Resp:  [9-27] 15 (05/03 0800) BP: (103-162)/(63-89) 156/89 (05/03 0800) SpO2:  [91 %-100 %] 99 % (05/03 0800) FiO2 (%):  [40 %] 40 % (05/03 0744)  PHYSICAL EXAM:  Sedated Left IJ Chest b/l air entry Hss1s2 Abd soft External catheter Lab Results Recent Labs    07/09/19 0344 07/10/19 0338  WBC 18.6* 19.6*  HGB 10.7* 10.1*  HCT 33.5* 31.7*  NA 149* 146*  K 4.3 4.1  CL 119* 115*  CO2 25 26  BUN 17 16  CREATININE 1.25* 0.98   Liver Panel Recent Labs    07/08/19 1139  PROT 5.5*  ALBUMIN 2.6*  AST 12*  ALT 11  ALKPHOS 71  BILITOT 0.9  BILIDIR 0.1  IBILI 0.8   Sedimentation Rate No results for input(s): ESRSEDRATE in the last 72 hours. C-Reactive Protein No results for input(s): CRP in the last 72 hours.  Microbiology:  Studies/Results: DG Chest Port 1 View  Result Date: 07/10/2019 CLINICAL DATA:  Intubation check EXAM: PORTABLE CHEST 1 VIEW COMPARISON:  05/10/2019, 4:49 a.m. FINDINGS: Endotracheal tube is slightly retracted, measuring approximately 6.5 cm above the carina although still below the thoracic inlet. Esophagogastric  tube poorly visualized on underpenetrated examination although appears remain below the diaphragm. Unchanged mild, diffuse interstitial opacity. No new airspace opacity. IMPRESSION: Endotracheal tube is slightly retracted, measuring approximately 6.5 cm above the carina although still below the thoracic inlet. Electronically Signed   By: Eddie Candle M.D.   On: 07/10/2019 11:25   DG Chest Port 1 View  Result Date: 07/10/2019 CLINICAL DATA:  Acute respiratory failure EXAM: PORTABLE CHEST 1 VIEW COMPARISON:  Radiograph 07/04/2019 FINDINGS: *Endotracheal tube in the mid trachea, 5 cm from the carina. *Transesophageal tube curling in the left upper quadrant. Nonvisualization of the side port. *Telemetry leads overlie the chest. Persistent cardiomegaly and prominence of the upper mediastinum. Diffuse interstitial opacity throughout both lungs with a basilar gradient. Progressive elevation the right hemidiaphragm could reflect developing sub pulmonic effusion or right basilar volume loss. No pneumothorax. No acute osseous or soft tissue abnormality. Degenerative changes are present in the imaged spine and shoulders. IMPRESSION: 1. Progressive elevation of the right hemidiaphragm could reflect developing subpulmonic effusion or increasing right basilar volume loss. 2. Diffuse interstitial opacity throughout both lungs with a basilar gradient, could reflect edema or infection. 3. Cardiomegaly. 4. Lines and tubes as above. Electronically Signed   By: March Rummage  [facility] M.D.   On: 07/10/2019 06:53   DG Chest Port 1 View  Result Date: 07/08/2019 CLINICAL DATA:  Headache, aching all over, COPD, smoker EXAM: PORTABLE CHEST 1 VIEW COMPARISON:  Portable exam 1146 hours compared to 07/07/2019 FINDINGS: Tip of endotracheal tube projects 6.1 cm above carina. Nasogastric tube extends into stomach. Enlargement of cardiac silhouette. Stable mediastinal contours. Scattered infiltrates in both lungs, favor mild pulmonary edema over  infection. Subsegmental atelectasis RIGHT base. No pleural effusion or pneumothorax. IMPRESSION: Enlargement of cardiac silhouette with BILATERAL pulmonary infiltrates question pulmonary edema. RIGHT basilar atelectasis. Electronically Signed   By: Lavonia Dana M.D.   On: 07/08/2019 12:57   ECHOCARDIOGRAM COMPLETE  Result Date: 07/08/2019    ECHOCARDIOGRAM REPORT   Patient Name:   John Haas Date of Exam: 07/08/2019 Medical Rec #:  [id number]       Height:       75.0 in Accession #:    [id number]      Weight:       226.4 lb Date of Birth:  1965/09/16        BSA:          2.313 m Patient Age:    54 years        BP:           96/62 mmHg Patient Gender: M               HR:           105 bpm. Exam Location:  [facility] Procedure: 2D Echo and Intracardiac Opacification Agent Indications:     Acute Respiratory Insufficiency 518.82/ R06.89  History:         Patient has no prior history of Echocardiogram examinations.  Sonographer:     Arville Go RDCS Referring Phys:  [id number] Bradly Bienenstock Diagnosing Phys: Skeet Latch MD  Sonographer Comments: Technically challenging study due to limited acoustic windows, Technically difficult study due to poor echo windows, no subcostal window and echo performed with patient supine and on artificial respirator. Image acquisition challenging due to patient body habitus. IMPRESSIONS  1. Left ventricular ejection fraction, by estimation, is 55 to 60%. The left ventricle has normal function. The left ventricle has no regional wall motion abnormalities. There is mild concentric left ventricular hypertrophy. Left ventricular diastolic parameters are consistent with Grade I diastolic dysfunction (impaired relaxation).  2. Right ventricular systolic function is normal. The right ventricular size is normal. There is normal pulmonary artery systolic pressure.  3. The mitral valve is normal in structure. Trivial mitral valve regurgitation. No evidence of mitral stenosis.  4. The aortic  valve is normal in structure. Aortic valve regurgitation is not visualized. Mild aortic valve stenosis. Aortic valve area, by VTI measures 1.68 cm. Aortic valve mean gradient measures 10.0 mmHg. Aortic valve Vmax measures 2.22 m/s.  5. The inferior vena cava is normal in size with greater than 50% respiratory variability, suggesting right atrial pressure of 3 mmHg. FINDINGS  Left Ventricle: Left ventricular ejection fraction, by estimation, is 55 to 60%. The left ventricle has normal function. The left ventricle has no regional wall motion abnormalities. Definity contrast agent was given IV to delineate the left ventricular  endocardial borders. The left ventricular internal cavity size was normal in size. There is mild concentric left ventricular hypertrophy. Left ventricular diastolic parameters are consistent with Grade I diastolic dysfunction (impaired relaxation). Indeterminate filling pressures. Right Ventricle: The right ventricular size is normal. No increase in right ventricular wall  thickness. Right ventricular systolic function is normal. There is normal pulmonary artery systolic pressure. The tricuspid regurgitant velocity is 1.59 m/s, and  with an assumed right atrial pressure of 10 mmHg, the estimated right ventricular systolic pressure is 17.6 mmHg. Left Atrium: Left atrial size was normal in size. Right Atrium: Right atrial size was normal in size. Pericardium: There is no evidence of pericardial effusion. Mitral Valve: The mitral valve is normal in structure. Normal mobility of the mitral valve leaflets. Trivial mitral valve regurgitation. No evidence of mitral valve stenosis. Tricuspid Valve: The tricuspid valve is normal in structure. Tricuspid valve regurgitation is trivial. No evidence of tricuspid stenosis. Aortic Valve: The aortic valve is normal in structure.. There is mild thickening and mild calcification of the aortic valve. Aortic valve regurgitation is not visualized. Mild aortic  stenosis is present. There is mild thickening of the aortic valve. There is mild calcification of the aortic valve. Aortic valve mean gradient measures 10.0 mmHg. Aortic valve peak gradient measures 19.7 mmHg. Aortic valve area, by VTI measures 1.68 cm. Pulmonic Valve: The pulmonic valve was normal in structure. Pulmonic valve regurgitation is not visualized. No evidence of pulmonic stenosis. Aorta: The aortic root is normal in size and structure. Venous: The inferior vena cava is normal in size with greater than 50% respiratory variability, suggesting right atrial pressure of 3 mmHg. IAS/Shunts: No atrial level shunt detected by color flow Doppler.  LEFT VENTRICLE PLAX 2D LVIDd:         5.01 cm  Diastology LVIDs:         3.30 cm  LV e' lateral:   6.31 cm/s LV PW:         1.30 cm  LV E/e' lateral: 13.4 LV IVS:        1.37 cm  LV e' medial:    8.05 cm/s LVOT diam:     2.10 cm  LV E/e' medial:  10.5 LV SV:         63 LV SV Index:   27 LVOT Area:     3.46 cm  RIGHT VENTRICLE RV Basal diam:  3.37 cm RV S prime:     19.80 cm/s LEFT ATRIUM           Index       RIGHT ATRIUM           Index LA diam:      4.40 cm 1.90 cm/m  RA Area:     15.70 cm LA Vol (A4C): 44.9 ml 19.41 ml/m RA Volume:   42.90 ml  18.55 ml/m  AORTIC VALVE AV Area (Vmax):    1.49 cm AV Area (Vmean):   1.70 cm AV Area (VTI):     1.68 cm AV Vmax:           222.00 cm/s AV Vmean:          144.000 cm/s AV VTI:            0.373 m AV Peak Grad:      19.7 mmHg AV Mean Grad:      10.0 mmHg LVOT Vmax:         95.30 cm/s LVOT Vmean:        70.600 cm/s LVOT VTI:          0.181 m LVOT/AV VTI ratio: 0.49  AORTA Ao Root diam: 3.50 cm MITRAL VALVE                TRICUSPID VALVE MV Area (  PHT): 5.84 cm     TR Peak grad:   10.1 mmHg MV Decel Time: 130 msec     TR Vmax:        159.00 cm/s MV E velocity: 84.80 cm/s MV A velocity: 106.00 cm/s  SHUNTS MV E/A ratio:  0.80         Systemic VTI:  0.18 m                             Systemic Diam: 2.10 cm Skeet Latch  MD Electronically signed by Skeet Latch MD Signature Date/Time: 07/08/2019/12:08:49 PM    Final    US SPLEEN (ABDOMEN LIMITED)  Result Date: 07/08/2019 CLINICAL DATA:  Sickle cell disease, beta thalassemia EXAM: ULTRASOUND ABDOMEN LIMITED COMPARISON:  None. FINDINGS: Spleen atrophic, 4.4 cm length. Heterogeneous splenic echogenicity without focal mass. No LEFT upper quadrant free fluid. 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 No LEFT upper quadrant free fluid. Electronically Signed   By: Lavonia Dana M.D.   On: 07/08/2019 14:04     Assessment/Plan: Impression/recommendation Seizures, partial , dysconjugate gaze, temporo/parietal, occipital involvement with severe headache rt side   D.D is varied - toxidrome,  vaso-occlusive because of sickle beta + thalassemia-- PRES is very likely  ... infection unlikely  initially thought to be cerebritis- but  repeat MRI done  with contrast does  not show cerebritis  No cerebral abscess LP shows only 7 cells and high protein and high glucose  ( a dissociation between cells- protein  HSV DNA neg and hence acyclovir was DC on Saturday. Ampicillin was DC Sunday Currently on ceftriaxone - which can be reduced to once a day dose  Anemia- s/o PRBC  Rt foot wound- chronic - saw his podiatrist on 4/16 and being treated as a superficial wound and no osteo- I saw his wound and it does not look overtly infected- this is not the cause of his presentation  and he has no bacteremia.     Discussed the management with care team

## 2019-07-10 NOTE — Progress Notes (Signed)
Nutrition Follow Up Note   DOCUMENTATION CODES:   Morbid obesity  INTERVENTION:   Once diet advanced:  Recommend Ensure Max protein supplement TID, each supplement provides 150kcal and 30g of protein.  Recommend Ocuvite daily for wound healing (provides zinc, vitamin A, vitamin C, Vitamin E, copper, and selenium)  NUTRITION DIAGNOSIS:   Inadequate oral intake related to inability to eat(pt sedated and ventilated) as evidenced by NPO status.  GOAL:   Patient will meet greater than or equal to 90% of their needs  MONITOR:   Diet advancement, Labs, Weight trends, Skin, I & O's  ASSESSMENT:   54 year old male with medical history significant for sickle cell beta thalassemia, COPD, opiod dependence, diabetes and hypertension who is admitted for suspected meningitis.  Pt extubated today. RD will add supplements and vitamins once diet advanced. No new weight since admit; recommend obtain new weight.   Medications reviewed and include: dexamethasone, folic acid, insulin, nicotine, oxycodone, protonix, senokot, thiamine, ceftriaxone, precedex   Labs reviewed: Na 146(H), K 4.1 wnl, P 3.5 wnl, Mg 1.9 wnl Wbc- 19.6(H), Hgb 10.1(L), Hct 31.7(L), MCV 69.4(L), MCH 22.1(L) cbgs- 178, 180, 120 x 24 hrs  Diet Order:   Diet Order    None     EDUCATION NEEDS:   Not appropriate for education at this time  Skin:  Skin Assessment: Reviewed RN Assessment(diabetic foot wound)  Last BM:  pta  Height:   Ht Readings from Last 1 Encounters:  07/05/19 6\' 3"  (1.905 m)    Weight:   Wt Readings from Last 1 Encounters:  07/05/19 102.7 kg    Ideal Body Weight:  89 kg  BMI:  Body mass index is 28.3 kg/m.  Estimated Nutritional Needs:   Kcal:  3000-3300kcal/day  Protein:  150g/day  Fluid:  >2.7L/day  07/07/19 MS, RD, LDN Please refer to [facility] for RD and/or RD on-call/weekend/after hours pager

## 2019-07-10 NOTE — Progress Notes (Signed)
fPharmacy Electrolyte Monitoring Consult:  Pharmacy consulted to assist in monitoring and replacing electrolytes in this 54 y.o. male admitted on 07/04/2019 with Fall, Headache, Eye Problem, and Hyperglycemia   Labs:  Sodium (mmol/L)  Date Value  07/10/2019 146 (H)   Potassium (mmol/L)  Date Value  07/10/2019 4.1   Magnesium (mg/dL)  Date Value  03/47/4259 1.9   Phosphorus (mg/dL)  Date Value  56/38/7564 3.5   Calcium (mg/dL)  Date Value  33/29/5188 8.1 (L)   Albumin (g/dL)  Date Value  41/66/0630 2.6 (L)    Assessment/Plan: Patient extubated this afternoon.   1. Electrolytes: No further replacement warranted. Will obtain all electrolytes with am labs. Will replace to maintain potassium ~ 4 and goal magnesium ~ 2.   2. Glucose: Patient on SSI. Follow glucose trend with reduction in steroids. Will adjust as clinically indicated.   3. Constipation: continue to monitor post extubation.    Pharmacy will continue to monitor and adjust per consult.    Hagop Mccollam L, PharmD 07/10/2019 4:52 PM

## 2019-07-10 NOTE — Progress Notes (Signed)
Subjective:   No reported seizure activity. On Keppra and dilantin. Has been off sedation since 9 AM yesterday and appears agitated with intermittently following commands.    Objective: Current vital signs: BP (!) 156/89 (BP Location: Left Wrist)   Pulse 78   Temp 98 F (36.7 C) (Oral)   Resp 15   Ht '6\' 3"'  (1.905 m)   Wt 102.7 kg   SpO2 99%   BMI 28.30 kg/m  Vital signs in last 24 hours: Temp:  [97.7 F (36.5 C)-99.1 F (37.3 C)] 98 F (36.7 C) (05/03 0744) Pulse Rate:  [73-115] 78 (05/03 0800) Resp:  [9-27] 15 (05/03 0800) BP: (103-162)/(63-89) 156/89 (05/03 0800) SpO2:  [91 %-100 %] 99 % (05/03 0800) FiO2 (%):  [40 %] 40 % (05/03 0744)  Intake/Output from previous day: 05/02 0701 - 05/03 0700 In: 3117.1 [I.V.:1316.9; NG/GT:800; IV Piggyback:1000.2] Out: 3790 [Urine:3615; Emesis/NG output:175] Intake/Output this shift: Total I/O In: 74.6 [I.V.:74.6] Out: 350 [Emesis/NG output:350] Nutritional status:  Diet Order    None      Neurologic Exam: Mental Status: Agitated, arrousable. Follows commands.  Cranial Nerves: II: Pupils reactive bilaterally III,IV,VI: Oculocephalic response present bilaterally. NO gaze preference V,VII: corneal reflex present bilaterally Motor: Moves all extremities on his own   Lab Results: Basic Metabolic Panel: Recent Labs  Lab 07/05/19 0503 07/05/19 0503 07/07/19 0237 07/07/19 0237 07/08/19 0600 07/09/19 0344 07/10/19 0338  NA 135  --  145  --  150* 149* 146*  K 5.1  --  4.2  --  3.4* 4.3 4.1  CL 105  --  114*  --  119* 119* 115*  CO2 20*  --  23  --  '26 25 26  ' GLUCOSE 348*  --  297*  --  160* 224* 199*  BUN 19  --  21*  --  21* 17 16  CREATININE 1.18  --  1.02  --  1.08 1.25* 0.98  CALCIUM 8.6*   < > 8.5*   < > 8.3* 8.0* 8.1*  MG  --   --   --   --  2.5*  --  1.9  PHOS  --   --   --   --  2.3*  --  3.5   < > = values in this interval not displayed.    Liver Function Tests: Recent Labs  Lab 07/04/19 1832  07/07/19 0237 07/08/19 1139  AST 27 12* 12*  ALT '17 15 11  ' ALKPHOS 116 80 71  BILITOT 1.6* 1.2 0.9  PROT 8.1 6.6 5.5*  ALBUMIN 3.9 2.9* 2.6*   No results for input(s): LIPASE, AMYLASE in the last 168 hours. No results for input(s): AMMONIA in the last 168 hours.  CBC: Recent Labs  Lab 07/03/19 2216 07/03/19 2216 07/04/19 1832 07/04/19 1832 07/05/19 0743 07/05/19 0743 07/07/19 0237 07/08/19 0345 07/08/19 2250 07/09/19 0344 07/10/19 0338  WBC 19.7*   < > 22.5*   < > 13.3*  --  19.0* 19.2*  --  18.6* 19.6*  NEUTROABS 13.6*  --  17.0*  --   --   --   --   --   --   --   --   HGB 11.1*   < > 11.6*   < > 11.2*   < > 10.7* 7.9* 9.7* 10.7* 10.1*  HCT 33.8*   < > 35.0*   < > 34.5*   < > 32.5* 24.7* 30.6* 33.5* 31.7*  MCV 65.9*   < >  65.8*   < > 66.5*  --  66.2* 67.5*  --  69.5* 69.4*  PLT 352   < > 352   < > 307  --  283 181  --  200 173   < > = values in this interval not displayed.    Cardiac Enzymes: No results for input(s): CKTOTAL, CKMB, CKMBINDEX, TROPONINI in the last 168 hours.  Lipid Panel: Recent Labs  Lab 07/07/19 1435  TRIG 88    CBG: Recent Labs  Lab 07/09/19 1557 07/09/19 1913 07/09/19 2317 07/10/19 0316 07/10/19 0716  GLUCAP 120* 146* 157* 178* 180*    Microbiology: Results for orders placed or performed during the hospital encounter of 07/04/19  Blood culture (routine x 2)     Status: None   Collection Time: 07/04/19  8:24 PM   Specimen: BLOOD  Result Value Ref Range Status   Specimen Description BLOOD RIGHT ANTECUBITAL  Final   Special Requests   Final    BOTTLES DRAWN AEROBIC AND ANAEROBIC Blood Culture adequate volume   Culture   Final    NO GROWTH 5 DAYS Performed at [facility], [address], [place], [place] [address]    Report Status 07/09/2019 FINAL  Final  Blood culture (routine x 2)     Status: None   Collection Time: 07/04/19  8:27 PM   Specimen: BLOOD  Result Value Ref Range Status   Specimen Description BLOOD  LEFT ANTECUBITAL  Final   Special Requests   Final    BOTTLES DRAWN AEROBIC AND ANAEROBIC Blood Culture adequate volume   Culture   Final    NO GROWTH 5 DAYS Performed at [facility], [address] [place], [place] [address]    Report Status 07/09/2019 FINAL  Final  Respiratory Panel by RT PCR (Flu A&B, Covid) - Nasopharyngeal Swab     Status: None   Collection Time: 07/04/19  9:34 PM   Specimen: Nasopharyngeal Swab  Result Value Ref Range Status   SARS Coronavirus 2 by RT PCR NEGATIVE NEGATIVE Final    Comment: (NOTE) SARS-CoV-2 target nucleic acids are NOT DETECTED. The SARS-CoV-2 RNA is generally detectable in upper respiratoy specimens during the acute phase of infection. The lowest concentration of SARS-CoV-2 viral copies this assay can detect is 131 copies/mL. A negative result does not preclude SARS-Cov-2 infection and should not be used as the sole basis for treatment or other patient management decisions. A negative result may occur with  improper specimen collection/handling, submission of specimen other than nasopharyngeal swab, presence of viral mutation(s) within the areas targeted by this assay, and inadequate number of viral copies (<131 copies/mL). A negative result must be combined with clinical observations, patient history, and epidemiological information. The expected result is Negative. Fact Sheet for Patients:  PinkCheek.be Fact Sheet for Healthcare Providers:  GravelBags.it This test is not yet ap proved or cleared by the [place] FDA and  has been authorized for detection and/or diagnosis of SARS-CoV-2 by FDA under an Emergency Use Authorization (EUA). This EUA will remain  in effect (meaning this test can be used) for the duration of the COVID-19 declaration under Section 564(b)(1) of the Act, 21 U.S.C. section 360bbb-3(b)(1), unless the authorization is terminated or revoked  sooner.    Influenza A by PCR NEGATIVE NEGATIVE Final   Influenza B by PCR NEGATIVE NEGATIVE Final    Comment: (NOTE) The Xpert Xpress SARS-CoV-2/FLU/RSV assay is intended as an aid in  the diagnosis of influenza  from Nasopharyngeal swab specimens and  should not be used as a sole basis for treatment. Nasal washings and  aspirates are unacceptable for Xpert Xpress SARS-CoV-2/FLU/RSV  testing. Fact Sheet for Patients: PinkCheek.be Fact Sheet for Healthcare Providers: GravelBags.it This test is not yet approved or cleared by the [place] FDA and  has been authorized for detection and/or diagnosis of SARS-CoV-2 by  FDA under an Emergency Use Authorization (EUA). This EUA will remain  in effect (meaning this test can be used) for the duration of the  Covid-19 declaration under Section 564(b)(1) of the Act, 21  U.S.C. section 360bbb-3(b)(1), unless the authorization is  terminated or revoked. Performed at [facility], [address] [place], [place] [address]   MRSA PCR Screening     Status: None   Collection Time: 07/05/19  2:54 AM   Specimen: Nasal Mucosa; Nasopharyngeal  Result Value Ref Range Status   MRSA by PCR NEGATIVE NEGATIVE Final    Comment:        The GeneXpert MRSA Assay (FDA approved for NASAL specimens only), is one component of a comprehensive MRSA colonization surveillance program. It is not intended to diagnose MRSA infection nor to guide or monitor treatment for MRSA infections. Performed at [facility], [address], [place], [place] [address]   Aerobic Culture (superficial specimen)     Status: None (Preliminary result)   Collection Time: 07/06/19  8:32 PM   Specimen: Wound  Result Value Ref Range Status   Specimen Description   Final    WOUND Performed at [facility], [address] [place], [place] [address]    Special Requests   Final     NONE Performed at [facility], [address] [place], [place] [address]    Gram Stain   Final    RARE WBC PRESENT, PREDOMINANTLY PMN RARE GRAM POSITIVE COCCI IN PAIRS    Culture   Final    MODERATE DIPHTHEROIDS(CORYNEBACTERIUM SPECIES) Standardized susceptibility testing for this organism is not available. CULTURE REINCUBATED FOR BETTER GROWTH Performed at [facility], [place] [address], [place], [place] [address]    Report Status PENDING  Incomplete  Anaerobic culture     Status: None (Preliminary result)   Collection Time: 07/07/19  4:26 PM   Specimen: PATH Cytology CSF; Cerebrospinal Fluid  Result Value Ref Range Status   Specimen Description   Final    CSF Performed at [facility], [address] [place], [place] [address]    Special Requests   Final    NONE Performed at [facility], [address] [place], [place] [address]    Gram Stain   Final    NO WBC SEEN NO ORGANISMS SEEN CYTOSPIN SMEAR Performed at [facility], [place] [address], [place], [place] [address]    Culture   Final    NO ANAEROBES ISOLATED; CULTURE IN PROGRESS FOR 5 DAYS   Report Status PENDING  Incomplete  CSF culture     Status: None (Preliminary result)   Collection Time: 07/07/19  4:26 PM   Specimen: PATH Cytology CSF; Cerebrospinal Fluid  Result Value Ref Range Status   Specimen Description   Final    CSF Performed at [facility], [address] [place], [place] [address]    Special Requests   Final    NONE Performed at [facility], [address], [place], [place] [address]    Gram Stain   Final  NO ORGANISMS SEEN WBC SEEN RED BLOOD CELLS PRESENT Performed at [facility], [address], [place], [place] [address]    Culture   Final    NO GROWTH < 24 HOURS Performed at [facility], [place] [address], [place], [place] [address]    Report Status PENDING  Incomplete  Culture, fungus without smear      Status: None (Preliminary result)   Collection Time: 07/07/19  4:26 PM   Specimen: PATH Cytology CSF; Cerebrospinal Fluid  Result Value Ref Range Status   Specimen Description   Final    CSF Performed at [facility], [address], [place], [place] [address]    Special Requests   Final    NONE Performed at [facility], [address] [place], [place] [address]    Culture   Final    NO FUNGUS ISOLATED AFTER 2 DAYS Performed at [facility], [place] [address] [place], [place] [address]    Report Status PENDING  Incomplete  CULTURE, BLOOD (ROUTINE X 2) w Reflex to ID Panel     Status: None (Preliminary result)   Collection Time: 07/08/19  5:30 AM   Specimen: BLOOD  Result Value Ref Range Status   Specimen Description BLOOD BLOOD LEFT HAND  Final   Special Requests   Final    BOTTLES DRAWN AEROBIC AND ANAEROBIC Blood Culture adequate volume   Culture   Final    NO GROWTH 2 DAYS Performed at [facility], [address] [place], [place] [address]    Report Status PENDING  Incomplete  Urine Culture     Status: None   Collection Time: 07/08/19  5:46 AM   Specimen: Urine, Random  Result Value Ref Range Status   Specimen Description   Final    URINE, RANDOM Performed at [facility], [address], [place], [place] [address]    Special Requests   Final    NONE Performed at [facility], [address] [place], [place] [address]    Culture   Final    NO GROWTH Performed at [facility], [place] [address], [place], [place] [address]    Report Status 07/09/2019 FINAL  Final  CULTURE, BLOOD (ROUTINE X 2) w Reflex to ID Panel     Status: None (Preliminary result)   Collection Time: 07/08/19 11:05 AM   Specimen: BLOOD  Result Value Ref Range Status   Specimen Description BLOOD BLOOD RIGHT HAND  Final   Special Requests   Final    BOTTLES DRAWN AEROBIC AND ANAEROBIC Blood Culture adequate volume   Culture   Final     NO GROWTH 2 DAYS Performed at [facility], [address] [place], [place] [address]    Report Status PENDING  Incomplete    Coagulation Studies: Recent Labs    07/07/19 1428  LABPROT 13.7  INR 1.1    Imaging: DG Chest Port 1 View  Result Date: 07/10/2019 CLINICAL DATA:  Acute respiratory failure EXAM: PORTABLE CHEST 1 VIEW COMPARISON:  Radiograph 07/04/2019 FINDINGS: *Endotracheal tube in the mid trachea, 5 cm from the carina. *Transesophageal tube curling in the left upper quadrant. Nonvisualization of the side port. *Telemetry leads overlie the chest. Persistent cardiomegaly and prominence of the upper mediastinum. Diffuse interstitial opacity throughout both lungs with a basilar gradient. Progressive elevation the right hemidiaphragm could reflect developing sub pulmonic effusion or right basilar volume loss. No pneumothorax. No acute osseous  or soft tissue abnormality. Degenerative changes are present in the imaged spine and shoulders. IMPRESSION: 1. Progressive elevation of the right hemidiaphragm could reflect developing subpulmonic effusion or increasing right basilar volume loss. 2. Diffuse interstitial opacity throughout both lungs with a basilar gradient, could reflect edema or infection. 3. Cardiomegaly. 4. Lines and tubes as above. Electronically Signed   By: Lovena Le M.D.   On: 07/10/2019 06:53   DG Chest Port 1 View  Result Date: 07/08/2019 CLINICAL DATA:  Headache, aching all over, COPD, smoker EXAM: PORTABLE CHEST 1 VIEW COMPARISON:  Portable exam 1146 hours compared to 07/07/2019 FINDINGS: Tip of endotracheal tube projects 6.1 cm above carina. Nasogastric tube extends into stomach. Enlargement of cardiac silhouette. Stable mediastinal contours. Scattered infiltrates in both lungs, favor mild pulmonary edema over infection. Subsegmental atelectasis RIGHT base. No pleural effusion or pneumothorax. IMPRESSION: Enlargement of cardiac silhouette with BILATERAL  pulmonary infiltrates question pulmonary edema. RIGHT basilar atelectasis. Electronically Signed   By: Lavonia Dana M.D.   On: 07/08/2019 12:57   ECHOCARDIOGRAM COMPLETE  Result Date: 07/08/2019    ECHOCARDIOGRAM REPORT   Patient Name:   John Haas Date of Exam: 07/08/2019 Medical Rec #:  [id number]       Height:       75.0 in Accession #:    [id number]      Weight:       226.4 lb Date of Birth:  10/09/1965        BSA:          2.313 m Patient Age:    54 years        BP:           96/62 mmHg Patient Gender: M               HR:           105 bpm. Exam Location:  [facility] Procedure: 2D Echo and Intracardiac Opacification Agent Indications:     Acute Respiratory Insufficiency 518.82/ R06.89  History:         Patient has no prior history of Echocardiogram examinations.  Sonographer:     Arville Go RDCS Referring Phys:  [id number] Bradly Bienenstock Diagnosing Phys: Skeet Latch MD  Sonographer Comments: Technically challenging study due to limited acoustic windows, Technically difficult study due to poor echo windows, no subcostal window and echo performed with patient supine and on artificial respirator. Image acquisition challenging due to patient body habitus. IMPRESSIONS  1. Left ventricular ejection fraction, by estimation, is 55 to 60%. The left ventricle has normal function. The left ventricle has no regional wall motion abnormalities. There is mild concentric left ventricular hypertrophy. Left ventricular diastolic parameters are consistent with Grade I diastolic dysfunction (impaired relaxation).  2. Right ventricular systolic function is normal. The right ventricular size is normal. There is normal pulmonary artery systolic pressure.  3. The mitral valve is normal in structure. Trivial mitral valve regurgitation. No evidence of mitral stenosis.  4. The aortic valve is normal in structure. Aortic valve regurgitation is not visualized. Mild aortic valve stenosis. Aortic valve area, by VTI measures 1.68 cm.  Aortic valve mean gradient measures 10.0 mmHg. Aortic valve Vmax measures 2.22 m/s.  5. The inferior vena cava is normal in size with greater than 50% respiratory variability, suggesting right atrial pressure of 3 mmHg. FINDINGS  Left Ventricle: Left ventricular ejection fraction, by estimation, is 55 to 60%. The left ventricle has normal function. The left ventricle has no regional  wall motion abnormalities. Definity contrast agent was given IV to delineate the left ventricular  endocardial borders. The left ventricular internal cavity size was normal in size. There is mild concentric left ventricular hypertrophy. Left ventricular diastolic parameters are consistent with Grade I diastolic dysfunction (impaired relaxation). Indeterminate filling pressures. Right Ventricle: The right ventricular size is normal. No increase in right ventricular wall thickness. Right ventricular systolic function is normal. There is normal pulmonary artery systolic pressure. The tricuspid regurgitant velocity is 1.59 m/s, and  with an assumed right atrial pressure of 10 mmHg, the estimated right ventricular systolic pressure is 71.2 mmHg. Left Atrium: Left atrial size was normal in size. Right Atrium: Right atrial size was normal in size. Pericardium: There is no evidence of pericardial effusion. Mitral Valve: The mitral valve is normal in structure. Normal mobility of the mitral valve leaflets. Trivial mitral valve regurgitation. No evidence of mitral valve stenosis. Tricuspid Valve: The tricuspid valve is normal in structure. Tricuspid valve regurgitation is trivial. No evidence of tricuspid stenosis. Aortic Valve: The aortic valve is normal in structure.. There is mild thickening and mild calcification of the aortic valve. Aortic valve regurgitation is not visualized. Mild aortic stenosis is present. There is mild thickening of the aortic valve. There is mild calcification of the aortic valve. Aortic valve mean gradient measures  10.0 mmHg. Aortic valve peak gradient measures 19.7 mmHg. Aortic valve area, by VTI measures 1.68 cm. Pulmonic Valve: The pulmonic valve was normal in structure. Pulmonic valve regurgitation is not visualized. No evidence of pulmonic stenosis. Aorta: The aortic root is normal in size and structure. Venous: The inferior vena cava is normal in size with greater than 50% respiratory variability, suggesting right atrial pressure of 3 mmHg. IAS/Shunts: No atrial level shunt detected by color flow Doppler.  LEFT VENTRICLE PLAX 2D LVIDd:         5.01 cm  Diastology LVIDs:         3.30 cm  LV e' lateral:   6.31 cm/s LV PW:         1.30 cm  LV E/e' lateral: 13.4 LV IVS:        1.37 cm  LV e' medial:    8.05 cm/s LVOT diam:     2.10 cm  LV E/e' medial:  10.5 LV SV:         63 LV SV Index:   27 LVOT Area:     3.46 cm  RIGHT VENTRICLE RV Basal diam:  3.37 cm RV S prime:     19.80 cm/s LEFT ATRIUM           Index       RIGHT ATRIUM           Index LA diam:      4.40 cm 1.90 cm/m  RA Area:     15.70 cm LA Vol (A4C): 44.9 ml 19.41 ml/m RA Volume:   42.90 ml  18.55 ml/m  AORTIC VALVE AV Area (Vmax):    1.49 cm AV Area (Vmean):   1.70 cm AV Area (VTI):     1.68 cm AV Vmax:           222.00 cm/s AV Vmean:          144.000 cm/s AV VTI:            0.373 m AV Peak Grad:      19.7 mmHg AV Mean Grad:      10.0 mmHg LVOT Vmax:  95.30 cm/s LVOT Vmean:        70.600 cm/s LVOT VTI:          0.181 m LVOT/AV VTI ratio: 0.49  AORTA Ao Root diam: 3.50 cm MITRAL VALVE                TRICUSPID VALVE MV Area (PHT): 5.84 cm     TR Peak grad:   10.1 mmHg MV Decel Time: 130 msec     TR Vmax:        159.00 cm/s MV E velocity: 84.80 cm/s MV A velocity: 106.00 cm/s  SHUNTS MV E/A ratio:  0.80         Systemic VTI:  0.18 m                             Systemic Diam: 2.10 cm Skeet Latch MD Electronically signed by Skeet Latch MD Signature Date/Time: 07/08/2019/12:08:49 PM    Final    US SPLEEN (ABDOMEN LIMITED)  Result Date:  07/08/2019 CLINICAL DATA:  Sickle cell disease, beta thalassemia EXAM: ULTRASOUND ABDOMEN LIMITED COMPARISON:  None. FINDINGS: Spleen atrophic, 4.4 cm length. Heterogeneous splenic echogenicity without focal mass. No LEFT upper quadrant free fluid. 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 No LEFT upper quadrant free fluid. Electronically Signed   By: Lavonia Dana M.D.   On: 07/08/2019 14:04    Medications:  I have reviewed the patient's current medications. Scheduled: . chlorhexidine      . chlorhexidine gluconate (MEDLINE KIT)  15 mL Mouth Rinse BID  . Chlorhexidine Gluconate Cloth  6 each Topical Daily  . dexamethasone (DECADRON) injection  10 mg Intravenous Q12H  . folic acid  1 mg Per Tube Daily  . free water  200 mL Per Tube Q4H  . insulin aspart  0-20 Units Subcutaneous Q4H  . mouth rinse  15 mL Mouth Rinse 10 times per day  . nystatin  5 mL Oral QID  . oxyCODONE  5 mg Oral Q4H  . pantoprazole sodium  40 mg Per Tube Daily  . senna-docusate  2 tablet Per Tube BID  . sodium chloride flush  10-40 mL Intracatheter Q12H  . thiamine  100 mg Per Tube Daily    Assessment/Plan: 54 year old malewith medical history significant forsickle thalassemia, COPD, diabetes and hypertension who presented to the emergency room with a complaint of headache and aching all over.In the ED altered at times and withintermittent shaking of his left arm and leg with deviation of his head to the leftwhile awake. Felt to be partial seizures and started on Keppra. Patient hypothermic and with elevated white blood cell count. Due to possibility of CNS infection patient started on broad spectrum antibiotics to include Acyclovir, Ampicillin and Ceftriaxone. Decadron also initiated.   LP done with elevated cells even though had elevated RBC. Has elevated ration of WBC/RBC to point towards suspected infection  On broad spectrum  antibiotics.  EEG reassuring on Friday and he is a lot more awake today  Consider either small dose precedex or fentanyl for agitation/pain ABG followed by CPAP trials Guarded prognosis at this time Con't dilantin and Keppra Will hold off EEG for now as more awake.

## 2019-07-10 NOTE — Progress Notes (Signed)
CRITICAL CARE PROGRESS NOTE    Name: John Haas MRN: [id number] DOB: Jan 30, 1966     LOS: 5   SUBJECTIVE FINDINGS & SIGNIFICANT EVENTS   Patient description:   John Haas is a 54 y.o. male with medical history significant for sickle thalassemia, COPD, diabetes and hypertension who presented to the emergency room with a complaint of headache and aching all over.  He apparently went to [facility] ED yesterday but left without being seen.  In the ER tonight he became belligerent at times and at times appeared confused.  Also while in the emergency room he was noted to have intermittent shaking of his left arm and leg with deviation of his head to the left but he remained awake. Patient had an extensive work-up in the emergency room that was significant for white cell count of 22,500 up from his baseline of 17,000 and he had mild hyponatremia of 129.  Creatinine was 1.48 and blood sugar 348.  Flu and Covid test were negative.  He also had extensive imaging with CT venogram of the head, CT angio head and neck with and without contrast that showed no acute findings.  The emergency room provider spoke with hematology at [facility] who did not think the findings had to do with his sickle thalassemia.  He also spoke with neurology at [facility] who recommended giving Hull for suspected partial seizures.  Patient received Keppra and Ativan in the emergency room.  He was also treated empirically for possible meningitis given his continued complaints of headache and slight worsening of his chronic leukocytosis.   Lines / Drains: Internal jugular central line, PIV x2  Cultures / Sepsis markers: Status post lumbar puncture, blood cultures  Antibiotics: Acyclovir, ampicillin, Rocephin,   Protocols / Consultants: PCCM, neurology,  hematology/oncology, infectious disease, hospitalist service   Events: 07/07/19-patient with recurrent partial seizure activity, evaluated at bedside with neurologist, patient is at high risk of trauma to tongue with possible bleeding/aspiration into airway, currently unable to protect airway, decision to intubate emergently.  Post intubation was able to speak with his sister and discuss care plan.  She is thankful for care, questions were answered sister is agreeable with care plan.  A lumbar puncture was able to be performed after endotracheal intubation.  Patient was placed on propofol and seizure activity has resolved. 07/08/19 -no seizure activity overnight. 07/09/19- plan to wean sedation with trial to liberate from MV today.  5/3 remains intubated,sedated, severe delerium     MEDICATIONS   Current Medication:  Current Facility-Administered Medications:  .  acetaminophen (TYLENOL) tablet 650 mg, 650 mg, Oral, Q6H PRN, 650 mg at 07/05/19 1940 **OR** acetaminophen (TYLENOL) suppository 650 mg, 650 mg, Rectal, Q6H PRN, Athena Masse, MD .  belladonna-opium (B&O) suppository 16.2-30 mg, 1 suppository, Rectal, Q6H PRN, Swayze, Ava, DO .  cefTRIAXone (ROCEPHIN) 2 g in sodium chloride 0.9 % 100 mL IVPB, 2 g, Intravenous, Q12H, Tsosie Billing, MD, Stopped at 07/10/19 0041 .  chlorhexidine gluconate (MEDLINE KIT) (PERIDEX) 0.12 % solution 15 mL, 15 mL, Mouth Rinse, BID, Lanney Gins, Fuad, MD, 15 mL at 07/09/19 1924 .  Chlorhexidine Gluconate Cloth 2 % PADS 6 each, 6 each, Topical, Daily, Athena Masse, MD, 6 each at 07/09/19 2130 .  dexamethasone (DECADRON) injection 10 mg, 10 mg, Intravenous, Q12H, Ottie Glazier, MD, 10 mg at 07/09/19 2105 .  folic acid injection 1 mg, 1 mg, Intravenous, Daily, Lanney Gins, Fuad, MD, 1 mg at 07/09/19 0930 .  free water 200  mL, 200 mL, Per Tube, Q4H, Darel Hong D, NP, 200 mL at 07/10/19 0324 .  insulin aspart (novoLOG) injection 0-20 Units, 0-20 Units,  Subcutaneous, Q4H, Darel Hong D, NP, 4 Units at 07/10/19 0327 .  levETIRAcetam (KEPPRA) IVPB 1000 mg/100 mL premix, 1,000 mg, Intravenous, Q12H, Alexis Goodell, MD, Stopped at 07/09/19 1940 .  MEDLINE mouth rinse, 15 mL, Mouth Rinse, 10 times per day, Ottie Glazier, MD, 15 mL at 07/10/19 0518 .  nystatin (MYCOSTATIN) 100000 UNIT/ML suspension 500,000 Units, 5 mL, Oral, QID, Bradly Bienenstock, NP, 500,000 Units at 07/09/19 2110 .  ondansetron (ZOFRAN) tablet 4 mg, 4 mg, Oral, Q6H PRN **OR** ondansetron (ZOFRAN) injection 4 mg, 4 mg, Intravenous, Q6H PRN, Athena Masse, MD, 4 mg at 07/06/19 0300 .  pantoprazole sodium (PROTONIX) 40 mg/20 mL oral suspension 40 mg, 40 mg, Per Tube, Daily, Ottie Glazier, MD, 40 mg at 07/09/19 0940 .  phenytoin (DILANTIN) 150 mg in sodium chloride 0.9 % 100 mL IVPB, 150 mg, Intravenous, Q8H, Alexis Goodell, MD, Last Rate: 206 mL/hr at 07/10/19 0536, 150 mg at 07/10/19 0536 .  propofol (DIPRIVAN) 1000 MG/100ML infusion, 5-40 mcg/kg/min, Intravenous, Titrated, Aleskerov, Fuad, MD, Last Rate: 21.6 mL/hr at 07/10/19 0400, 35 mcg/kg/min at 07/10/19 0400 .  senna-docusate (Senokot-S) tablet 2 tablet, 2 tablet, Per Tube, BID, Charlett Nose, RPH, 2 tablet at 07/09/19 2107 .  sodium chloride flush (NS) 0.9 % injection 10-40 mL, 10-40 mL, Intracatheter, PRN, Bonnell Public Tublu, MD .  sodium chloride flush (NS) 0.9 % injection 10-40 mL, 10-40 mL, Intracatheter, Q12H, Darel Hong D, NP, 10 mL at 07/09/19 2105 .  tamsulosin (FLOMAX) capsule 0.4 mg, 0.4 mg, Oral, Daily, Bonnell Public Tublu, MD, 0.4 mg at 07/09/19 0941 .  thiamine (B-1) injection 100 mg, 100 mg, Intravenous, Daily, Lanney Gins, Fuad, MD, 100 mg at 07/09/19 0936 .  ziprasidone (GEODON) injection 20 mg, 20 mg, Intramuscular, Q4H PRN, Swayze, Ava, DO   REVIEW OF SYSTEMS  PATIENT IS UNABLE TO PROVIDE COMPLETE REVIEW OF SYSTEM S DUE TO SEVERE CRITICAL ILLNESS AND  ENCEPHALOPATHY   PHYSICAL EXAMINATION   Vital Signs: Temp:  [97.7 F (36.5 C)-99.1 F (37.3 C)] 97.8 F (36.6 C) (05/03 0400) Pulse Rate:  [73-115] 73 (05/03 0400) Resp:  [9-27] 18 (05/03 0400) BP: (103-162)/(63-86) 111/66 (05/03 0400) SpO2:  [91 %-100 %] 96 % (05/03 0400) FiO2 (%):  [40 %] 40 % (05/03 0419)  PHYSICAL EXAMINATION:  GENERAL:critically ill appearing, +resp distress HEAD: Normocephalic, atraumatic.  EYES: Pupils equal, round, reactive to light.  No scleral icterus.  MOUTH: Moist mucosal membrane. NECK: Supple. No thyromegaly. No nodules. No JVD.  PULMONARY: +rhonchi, +wheezing CARDIOVASCULAR: S1 and S2. Regular rate and rhythm. No murmurs, rubs, or gallops.  GASTROINTESTINAL: Soft, nontender, -distended. Positive bowel sounds.  MUSCULOSKELETAL: No swelling, clubbing, or edema.  NEUROLOGIC: obtunded SKIN:intact,warm,dry     PERTINENT DATA     Infusions: . cefTRIAXone (ROCEPHIN)  IV Stopped (07/10/19 0041)  . levETIRAcetam Stopped (07/09/19 1940)  . phenytoin (DILANTIN) IV 150 mg (07/10/19 0536)  . propofol (DIPRIVAN) infusion 35 mcg/kg/min (07/10/19 0400)   Scheduled Medications: . chlorhexidine gluconate (MEDLINE KIT)  15 mL Mouth Rinse BID  . Chlorhexidine Gluconate Cloth  6 each Topical Daily  . dexamethasone (DECADRON) injection  10 mg Intravenous Q12H  . folic acid  1 mg Intravenous Daily  . free water  200 mL Per Tube Q4H  . insulin aspart  0-20 Units Subcutaneous Q4H  . mouth rinse  15 mL Mouth Rinse 10 times per day  . nystatin  5 mL Oral QID  . pantoprazole sodium  40 mg Per Tube Daily  . senna-docusate  2 tablet Per Tube BID  . sodium chloride flush  10-40 mL Intracatheter Q12H  . tamsulosin  0.4 mg Oral Daily  . thiamine injection  100 mg Intravenous Daily   PRN Medications: acetaminophen **OR** acetaminophen, belladonna-opium, ondansetron **OR** ondansetron (ZOFRAN) IV, sodium chloride flush, ziprasidone Hemodynamic parameters:    Intake/Output: 05/02 0701 - 05/03 0700 In: 3117.1 [I.V.:1316.9; NG/GT:800; IV Piggyback:1000.2] Out: 3790 [Urine:3615; Emesis/NG output:175]  Ventilator  Settings: Vent Mode: PRVC FiO2 (%):  [40 %] 40 % Set Rate:  [16 bmp] 16 bmp Vt Set:  [500 mL] 500 mL PEEP:  [5 cmH20] 5 cmH20 Plateau Pressure:  [7 cmH20-15 cmH20] 13 cmH20    LAB RESULTS:  Basic Metabolic Panel: Recent Labs  Lab 07/05/19 0503 07/05/19 0503 07/07/19 0237 07/07/19 0237 07/08/19 0600 07/08/19 0600 07/09/19 0344 07/10/19 0338  NA 135  --  145  --  150*  --  149* 146*  K 5.1   < > 4.2   < > 3.4*   < > 4.3 4.1  CL 105  --  114*  --  119*  --  119* 115*  CO2 20*  --  23  --  26  --  25 26  GLUCOSE 348*  --  297*  --  160*  --  224* 199*  BUN 19  --  21*  --  21*  --  17 16  CREATININE 1.18  --  1.02  --  1.08  --  1.25* 0.98  CALCIUM 8.6*  --  8.5*  --  8.3*  --  8.0* 8.1*  MG  --   --   --   --  2.5*  --   --  1.9  PHOS  --   --   --   --  2.3*  --   --  3.5   < > = values in this interval not displayed.   Liver Function Tests: Recent Labs  Lab 07/04/19 1832 07/07/19 0237 07/08/19 1139  AST 27 12* 12*  ALT '17 15 11  ' ALKPHOS 116 80 71  BILITOT 1.6* 1.2 0.9  PROT 8.1 6.6 5.5*  ALBUMIN 3.9 2.9* 2.6*   No results for input(s): LIPASE, AMYLASE in the last 168 hours. No results for input(s): AMMONIA in the last 168 hours. CBC: Recent Labs  Lab 07/03/19 2216 07/03/19 2216 07/04/19 1832 07/04/19 1832 07/05/19 0743 07/05/19 0743 07/07/19 0237 07/08/19 0345 07/08/19 2250 07/09/19 0344 07/10/19 0338  WBC 19.7*   < > 22.5*   < > 13.3*  --  19.0* 19.2*  --  18.6* 19.6*  NEUTROABS 13.6*  --  17.0*  --   --   --   --   --   --   --   --   HGB 11.1*   < > 11.6*   < > 11.2*   < > 10.7* 7.9* 9.7* 10.7* 10.1*  HCT 33.8*   < > 35.0*   < > 34.5*   < > 32.5* 24.7* 30.6* 33.5* 31.7*  MCV 65.9*   < > 65.8*   < > 66.5*  --  66.2* 67.5*  --  69.5* 69.4*  PLT 352   < > 352   < > 307  --  283 181  --  200  173   < > =  values in this interval not displayed.   Cardiac Enzymes: No results for input(s): CKTOTAL, CKMB, CKMBINDEX, TROPONINI in the last 168 hours. BNP: Invalid input(s): POCBNP CBG: Recent Labs  Lab 07/09/19 1557 07/09/19 1913 07/09/19 2317 07/10/19 0316 07/10/19 0716  GLUCAP 120* 146* 157* 178* 180*       IMAGING RESULTS:  Imaging: DG Chest Port 1 View  Result Date: 07/10/2019 CLINICAL DATA:  Acute respiratory failure EXAM: PORTABLE CHEST 1 VIEW COMPARISON:  Radiograph 07/04/2019 FINDINGS: *Endotracheal tube in the mid trachea, 5 cm from the carina. *Transesophageal tube curling in the left upper quadrant. Nonvisualization of the side port. *Telemetry leads overlie the chest. Persistent cardiomegaly and prominence of the upper mediastinum. Diffuse interstitial opacity throughout both lungs with a basilar gradient. Progressive elevation the right hemidiaphragm could reflect developing sub pulmonic effusion or right basilar volume loss. No pneumothorax. No acute osseous or soft tissue abnormality. Degenerative changes are present in the imaged spine and shoulders. IMPRESSION: 1. Progressive elevation of the right hemidiaphragm could reflect developing subpulmonic effusion or increasing right basilar volume loss. 2. Diffuse interstitial opacity throughout both lungs with a basilar gradient, could reflect edema or infection. 3. Cardiomegaly. 4. Lines and tubes as above. Electronically Signed   By: Lovena Le M.D.   On: 07/10/2019 06:53   DG Chest Port 1 View  Result Date: 07/08/2019 CLINICAL DATA:  Headache, aching all over, COPD, smoker EXAM: PORTABLE CHEST 1 VIEW COMPARISON:  Portable exam 1146 hours compared to 07/07/2019 FINDINGS: Tip of endotracheal tube projects 6.1 cm above carina. Nasogastric tube extends into stomach. Enlargement of cardiac silhouette. Stable mediastinal contours. Scattered infiltrates in both lungs, favor mild pulmonary edema over infection. Subsegmental  atelectasis RIGHT base. No pleural effusion or pneumothorax. IMPRESSION: Enlargement of cardiac silhouette with BILATERAL pulmonary infiltrates question pulmonary edema. RIGHT basilar atelectasis. Electronically Signed   By: Lavonia Dana M.D.   On: 07/08/2019 12:57   ECHOCARDIOGRAM COMPLETE  Result Date: 07/08/2019    ECHOCARDIOGRAM REPORT   Patient Name:   John Haas Date of Exam: 07/08/2019 Medical Rec #:  [id number]       Height:       75.0 in Accession #:    [id number]      Weight:       226.4 lb Date of Birth:  April 17, 1965        BSA:          2.313 m Patient Age:    50 years        BP:           96/62 mmHg Patient Gender: M               HR:           105 bpm. Exam Location:  [facility] Procedure: 2D Echo and Intracardiac Opacification Agent Indications:     Acute Respiratory Insufficiency 518.82/ R06.89  History:         Patient has no prior history of Echocardiogram examinations.  Sonographer:     Arville Go RDCS Referring Phys:  [id number] Bradly Bienenstock Diagnosing Phys: Skeet Latch MD  Sonographer Comments: Technically challenging study due to limited acoustic windows, Technically difficult study due to poor echo windows, no subcostal window and echo performed with patient supine and on artificial respirator. Image acquisition challenging due to patient body habitus. IMPRESSIONS  1. Left ventricular ejection fraction, by estimation, is 55 to 60%. The left ventricle has normal function. The left ventricle has no regional  wall motion abnormalities. There is mild concentric left ventricular hypertrophy. Left ventricular diastolic parameters are consistent with Grade I diastolic dysfunction (impaired relaxation).  2. Right ventricular systolic function is normal. The right ventricular size is normal. There is normal pulmonary artery systolic pressure.  3. The mitral valve is normal in structure. Trivial mitral valve regurgitation. No evidence of mitral stenosis.  4. The aortic valve is normal in  structure. Aortic valve regurgitation is not visualized. Mild aortic valve stenosis. Aortic valve area, by VTI measures 1.68 cm. Aortic valve mean gradient measures 10.0 mmHg. Aortic valve Vmax measures 2.22 m/s.  5. The inferior vena cava is normal in size with greater than 50% respiratory variability, suggesting right atrial pressure of 3 mmHg. FINDINGS  Left Ventricle: Left ventricular ejection fraction, by estimation, is 55 to 60%. The left ventricle has normal function. The left ventricle has no regional wall motion abnormalities. Definity contrast agent was given IV to delineate the left ventricular  endocardial borders. The left ventricular internal cavity size was normal in size. There is mild concentric left ventricular hypertrophy. Left ventricular diastolic parameters are consistent with Grade I diastolic dysfunction (impaired relaxation). Indeterminate filling pressures. Right Ventricle: The right ventricular size is normal. No increase in right ventricular wall thickness. Right ventricular systolic function is normal. There is normal pulmonary artery systolic pressure. The tricuspid regurgitant velocity is 1.59 m/s, and  with an assumed right atrial pressure of 10 mmHg, the estimated right ventricular systolic pressure is 90.9 mmHg. Left Atrium: Left atrial size was normal in size. Right Atrium: Right atrial size was normal in size. Pericardium: There is no evidence of pericardial effusion. Mitral Valve: The mitral valve is normal in structure. Normal mobility of the mitral valve leaflets. Trivial mitral valve regurgitation. No evidence of mitral valve stenosis. Tricuspid Valve: The tricuspid valve is normal in structure. Tricuspid valve regurgitation is trivial. No evidence of tricuspid stenosis. Aortic Valve: The aortic valve is normal in structure.. There is mild thickening and mild calcification of the aortic valve. Aortic valve regurgitation is not visualized. Mild aortic stenosis is present.  There is mild thickening of the aortic valve. There is mild calcification of the aortic valve. Aortic valve mean gradient measures 10.0 mmHg. Aortic valve peak gradient measures 19.7 mmHg. Aortic valve area, by VTI measures 1.68 cm. Pulmonic Valve: The pulmonic valve was normal in structure. Pulmonic valve regurgitation is not visualized. No evidence of pulmonic stenosis. Aorta: The aortic root is normal in size and structure. Venous: The inferior vena cava is normal in size with greater than 50% respiratory variability, suggesting right atrial pressure of 3 mmHg. IAS/Shunts: No atrial level shunt detected by color flow Doppler.  LEFT VENTRICLE PLAX 2D LVIDd:         5.01 cm  Diastology LVIDs:         3.30 cm  LV e' lateral:   6.31 cm/s LV PW:         1.30 cm  LV E/e' lateral: 13.4 LV IVS:        1.37 cm  LV e' medial:    8.05 cm/s LVOT diam:     2.10 cm  LV E/e' medial:  10.5 LV SV:         63 LV SV Index:   27 LVOT Area:     3.46 cm  RIGHT VENTRICLE RV Basal diam:  3.37 cm RV S prime:     19.80 cm/s LEFT ATRIUM  Index       RIGHT ATRIUM           Index LA diam:      4.40 cm 1.90 cm/m  RA Area:     15.70 cm LA Vol (A4C): 44.9 ml 19.41 ml/m RA Volume:   42.90 ml  18.55 ml/m  AORTIC VALVE AV Area (Vmax):    1.49 cm AV Area (Vmean):   1.70 cm AV Area (VTI):     1.68 cm AV Vmax:           222.00 cm/s AV Vmean:          144.000 cm/s AV VTI:            0.373 m AV Peak Grad:      19.7 mmHg AV Mean Grad:      10.0 mmHg LVOT Vmax:         95.30 cm/s LVOT Vmean:        70.600 cm/s LVOT VTI:          0.181 m LVOT/AV VTI ratio: 0.49  AORTA Ao Root diam: 3.50 cm MITRAL VALVE                TRICUSPID VALVE MV Area (PHT): 5.84 cm     TR Peak grad:   10.1 mmHg MV Decel Time: 130 msec     TR Vmax:        159.00 cm/s MV E velocity: 84.80 cm/s MV A velocity: 106.00 cm/s  SHUNTS MV E/A ratio:  0.80         Systemic VTI:  0.18 m                             Systemic Diam: 2.10 cm Skeet Latch MD Electronically  signed by Skeet Latch MD Signature Date/Time: 07/08/2019/12:08:49 PM    Final    US SPLEEN (ABDOMEN LIMITED)  Result Date: 07/08/2019 CLINICAL DATA:  Sickle cell disease, beta thalassemia EXAM: ULTRASOUND ABDOMEN LIMITED COMPARISON:  None. FINDINGS: Spleen atrophic, 4.4 cm length. Heterogeneous splenic echogenicity without focal mass. No LEFT upper quadrant free fluid. 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 No LEFT upper quadrant free fluid. Electronically Signed   By: Lavonia Dana M.D.   On: 07/08/2019 14:04   '@[email or web address]' @ DG Chest Port 1 View  Result Date: 07/10/2019 CLINICAL DATA:  Acute respiratory failure EXAM: PORTABLE CHEST 1 VIEW COMPARISON:  Radiograph 07/04/2019 FINDINGS: *Endotracheal tube in the mid trachea, 5 cm from the carina. *Transesophageal tube curling in the left upper quadrant. Nonvisualization of the side port. *Telemetry leads overlie the chest. Persistent cardiomegaly and prominence of the upper mediastinum. Diffuse interstitial opacity throughout both lungs with a basilar gradient. Progressive elevation the right hemidiaphragm could reflect developing sub pulmonic effusion or right basilar volume loss. No pneumothorax. No acute osseous or soft tissue abnormality. Degenerative changes are present in the imaged spine and shoulders. IMPRESSION: 1. Progressive elevation of the right hemidiaphragm could reflect developing subpulmonic effusion or increasing right basilar volume loss. 2. Diffuse interstitial opacity throughout both lungs with a basilar gradient, could reflect edema or infection. 3. Cardiomegaly. 4. Lines and tubes as above. Electronically Signed   By: Lovena Le M.D.   On: 07/10/2019 06:53     ASSESSMENT AND PLAN   54 yo AAM with SCD admitted fro severe agitation and delerium +intermittent shaking of his left arm and  leg with deviation of his head to the leftwhile awake. Felt to be  partial seizures and started on Keppra.Patient hypothermic and with elevated white blood cell count. Due to possibility of CNS infection patient started on broad spectrum antibiotics to include Acyclovir, Ampicillin and Ceftriaxone. Patient progressed to severe encephalopathy and severe resp failure with inability to protect airway    Severe ACUTE Hypoxic and Hypercapnic Respiratory Failure -continue Mechanical Ventilator support -continue Bronchodilator Therapy -Wean Fio2 and PEEP as tolerated -VAP/VENT bundle implementation -will perform SAT/SBT when respiratory parameters are met    NEUROLOGY - intubated and sedated - minimal sedation to achieve a RASS goal: -1 Recurrent partial seizures with status epilepticus-  - likely due to PRES - (patient with sickle cell came in with accelerated HTN prior to admission, visual deficits, severe headache with seizure activity, reversible defect on MRI, and increased protein on CSF) Follow up NEURO RECS    Sickle cell beta thalassemia Follow up Hem recs   Right foot infected diabetic ulcer INFECTIOUS DISEASE -continue antibiotics as prescribed -follow up cultures -follow up ID consultation Patient had MRI foot at [facility] without osteo-February 2021    GI GI PROPHYLAXIS as indicated  NUTRITIONAL STATUS DIET-->TF's as tolerated Constipation protocol as indicated  ELECTROLYTES -follow labs as needed -replace as needed -pharmacy consultation and following    DVT/GI PRX ordered TRANSFUSIONS AS NEEDED MONITOR FSBS ASSESS the need for LABS as needed   Critical Care Time devoted to patient care services described in this note is 38 minutes.   Overall, patient is critically ill, prognosis is guarded.   Prognosis is very poor   Corrin Parker, M.D.  [facility]  Medical Director [place] Director [facility] Cardio-Pulmonary Department

## 2019-07-10 NOTE — Progress Notes (Signed)
Pt was suctioned prior to extubation for a small amount of thick white secretions. Per Dr. Clovis Fredrickson order, he was extubated to a 3 L nasal cannula.

## 2019-07-11 ENCOUNTER — Inpatient Hospital Stay: Payer: Medicare Other

## 2019-07-11 DIAGNOSIS — G039 Meningitis, unspecified: Secondary | ICD-10-CM | POA: Diagnosis not present

## 2019-07-11 DIAGNOSIS — G40909 Epilepsy, unspecified, not intractable, without status epilepticus: Secondary | ICD-10-CM

## 2019-07-11 DIAGNOSIS — R410 Disorientation, unspecified: Secondary | ICD-10-CM | POA: Diagnosis not present

## 2019-07-11 LAB — GLUCOSE, CAPILLARY
Glucose-Capillary: 119 mg/dL — ABNORMAL HIGH (ref 70–99)
Glucose-Capillary: 134 mg/dL — ABNORMAL HIGH (ref 70–99)
Glucose-Capillary: 169 mg/dL — ABNORMAL HIGH (ref 70–99)
Glucose-Capillary: 178 mg/dL — ABNORMAL HIGH (ref 70–99)
Glucose-Capillary: 192 mg/dL — ABNORMAL HIGH (ref 70–99)
Glucose-Capillary: 234 mg/dL — ABNORMAL HIGH (ref 70–99)

## 2019-07-11 LAB — CBC WITH DIFFERENTIAL/PLATELET
Abs Immature Granulocytes: 0.16 10*3/uL — ABNORMAL HIGH (ref 0.00–0.07)
Basophils Absolute: 0 10*3/uL (ref 0.0–0.1)
Basophils Relative: 0 %
Eosinophils Absolute: 0.1 10*3/uL (ref 0.0–0.5)
Eosinophils Relative: 1 %
HCT: 34.1 % — ABNORMAL LOW (ref 39.0–52.0)
Hemoglobin: 11 g/dL — ABNORMAL LOW (ref 13.0–17.0)
Immature Granulocytes: 1 %
Lymphocytes Relative: 27 %
Lymphs Abs: 6.2 10*3/uL — ABNORMAL HIGH (ref 0.7–4.0)
MCH: 22.4 pg — ABNORMAL LOW (ref 26.0–34.0)
MCHC: 32.3 g/dL (ref 30.0–36.0)
MCV: 69.3 fL — ABNORMAL LOW (ref 80.0–100.0)
Monocytes Absolute: 2.3 10*3/uL — ABNORMAL HIGH (ref 0.1–1.0)
Monocytes Relative: 10 %
Neutro Abs: 14.2 10*3/uL — ABNORMAL HIGH (ref 1.7–7.7)
Neutrophils Relative %: 61 %
Platelets: 149 10*3/uL — ABNORMAL LOW (ref 150–400)
RBC: 4.92 MIL/uL (ref 4.22–5.81)
RDW: 19.1 % — ABNORMAL HIGH (ref 11.5–15.5)
Smear Review: NORMAL
WBC: 22.9 10*3/uL — ABNORMAL HIGH (ref 4.0–10.5)
nRBC: 2.9 % — ABNORMAL HIGH (ref 0.0–0.2)

## 2019-07-11 LAB — BASIC METABOLIC PANEL
Anion gap: 6 (ref 5–15)
BUN: 18 mg/dL (ref 6–20)
CO2: 28 mmol/L (ref 22–32)
Calcium: 8.4 mg/dL — ABNORMAL LOW (ref 8.9–10.3)
Chloride: 113 mmol/L — ABNORMAL HIGH (ref 98–111)
Creatinine, Ser: 0.85 mg/dL (ref 0.61–1.24)
GFR calc Af Amer: >60 mL/min (ref >60)
GFR calc non Af Amer: >60 mL/min (ref >60)
Glucose, Bld: 191 mg/dL — ABNORMAL HIGH (ref 70–99)
Potassium: 3.6 mmol/L (ref 3.5–5.1)
Sodium: 147 mmol/L — ABNORMAL HIGH (ref 135–145)

## 2019-07-11 LAB — ERYTHROPOIETIN: Erythropoietin: 25.6 m[IU]/mL — ABNORMAL HIGH (ref 2.6–18.5)

## 2019-07-11 LAB — OLIGOCLONAL BANDS, CSF + SERM

## 2019-07-11 LAB — MAGNESIUM: Magnesium: 2.1 mg/dL (ref 1.7–2.4)

## 2019-07-11 LAB — PHOSPHORUS: Phosphorus: 2.6 mg/dL (ref 2.5–4.6)

## 2019-07-11 LAB — ABO/RH: ABO/RH(D): O NEG

## 2019-07-11 MED ORDER — HYDROMORPHONE HCL 1 MG/ML IJ SOLN
1.0000 mg | INTRAMUSCULAR | Status: DC | PRN
  Administered 2019-07-11 – 2019-07-13 (×9): 1 mg via INTRAVENOUS
  Filled 2019-07-11 (×10): qty 1

## 2019-07-11 MED ORDER — POTASSIUM CL IN DEXTROSE 5% 20 MEQ/L IV SOLN
20.0000 meq | INTRAVENOUS | Status: DC
  Administered 2019-07-11 – 2019-07-13 (×3): 20 meq via INTRAVENOUS
  Filled 2019-07-11 (×3): qty 1000

## 2019-07-11 MED ORDER — PROMETHAZINE HCL 25 MG/ML IJ SOLN
6.2500 mg | Freq: Once | INTRAMUSCULAR | Status: AC
  Administered 2019-07-11: 6.25 mg via INTRAVENOUS
  Filled 2019-07-11: qty 1

## 2019-07-11 MED ORDER — THIAMINE HCL 100 MG/ML IJ SOLN
100.0000 mg | Freq: Every day | INTRAMUSCULAR | Status: DC
  Administered 2019-07-11 – 2019-07-12 (×2): 100 mg via INTRAVENOUS
  Filled 2019-07-11 (×2): qty 2

## 2019-07-11 MED ORDER — HYDROMORPHONE 1 MG/ML IV SOLN
INTRAVENOUS | Status: DC
  Filled 2019-07-11: qty 25

## 2019-07-11 MED ORDER — HYDROMORPHONE HCL 1 MG/ML IJ SOLN
2.0000 mg | Freq: Once | INTRAMUSCULAR | Status: AC
  Administered 2019-07-11: 2 mg via INTRAVENOUS

## 2019-07-11 MED ORDER — FOLIC ACID 5 MG/ML IJ SOLN
1.0000 mg | Freq: Every day | INTRAMUSCULAR | Status: DC
  Administered 2019-07-11 – 2019-07-12 (×2): 1 mg via INTRAVENOUS
  Filled 2019-07-11 (×4): qty 0.2

## 2019-07-11 MED ORDER — ENOXAPARIN SODIUM 40 MG/0.4ML ~~LOC~~ SOLN
40.0000 mg | Freq: Two times a day (BID) | SUBCUTANEOUS | Status: DC
  Administered 2019-07-11 – 2019-07-12 (×3): 40 mg via SUBCUTANEOUS
  Filled 2019-07-11 (×3): qty 0.4

## 2019-07-11 MED ORDER — HYDROMORPHONE 1 MG/ML IV SOLN
INTRAVENOUS | Status: DC
  Filled 2019-07-11 (×4): qty 30

## 2019-07-11 NOTE — Progress Notes (Signed)
The Clinical status was relayed to family in detail.  Updated and notified of patients medical condition. 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   Family are satisfied with Plan of action and management. All questions answered  Additional CC time 32 mins   Nieko Clarin Santiago Glad, M.D.  [facility]  Medical Director [facility] Medical Director [facility] Cardio-Pulmonary Department

## 2019-07-11 NOTE — Progress Notes (Signed)
fPharmacy Electrolyte Monitoring Consult:  Pharmacy consulted to assist in monitoring and replacing electrolytes in this 54 y.o. male admitted on 07/04/2019 with Fall, Headache, Eye Problem, and Hyperglycemia   Labs:  Sodium (mmol/L)  Date Value  07/11/2019 147 (H)   Potassium (mmol/L)  Date Value  07/11/2019 3.6   Magnesium (mg/dL)  Date Value  00/44/7158 2.1   Phosphorus (mg/dL)  Date Value  06/38/6854 2.6   Calcium (mg/dL)  Date Value  88/30/1415 8.4 (L)   Albumin (g/dL)  Date Value  97/33/1250 2.6 (L)    Assessment/Plan: Patient extubated 5/3.   1. Electrolytes: Initiated D5/Potassium at 90mL/hr. Will obtain all electrolytes with am labs. Will replace to maintain potassium ~ 4 and goal magnesium ~ 2.   2. Glucose: Patient on SSI. Follow glucose trend dexamethasone discontinued 5/3, D5 infusion initiated 5/4. Will adjust as clinically indicated.   3. Constipation: continue to monitor post extubation.    Pharmacy will continue to monitor and adjust per consult.    Aloysius Heinle L, RPh 07/11/2019 11:20 AM

## 2019-07-11 NOTE — Progress Notes (Signed)
Chaplain spoke to John Haas, "John Haas's" brother who arrived from [place] today. He is desiring to be helpful and was open to education. He desires to assist in John Haas's future and works well with John Haas's sisters. John Haas has John Haas and two sisters as his family. He has lived alone since his mothers passing. He has suffering with many situations through out his life including Sickle Cell. Chaplain offered education to how to start conversations with providers. John Haas is thankful for conversations that aid him in leading his brother and family through this situation. He plans to be at the hospital on Wednesday 5.5 by 9 am.     07/11/19 2100  Clinical Encounter Type  Visited With Patient;Family  Visit Type Initial  Spiritual Encounters  Spiritual Needs Emotional;Other (Comment)  Stress Factors  Patient Stress Factors Major life changes;Health changes

## 2019-07-11 NOTE — Progress Notes (Signed)
Hematology/Oncology Consult note [facility]  Telephone:([phone] [phone]  Patient Care Team: System, Pcp Not In as PCP - General   Name of the patient: John Haas  [id number]  [birth year]   Date of visit: 07/11/2019   Interval history-patient was extubated yesterday and currently remains delirious and agitated.  However he is able to answer simple questions or patient sister John Haas was at the bedside.  He is currently saturating well on 2 L of oxygen.    Review of systems- Review of Systems  Unable to perform ROS: Mental acuity      Allergies  Allergen Reactions  . Ketamine Hives  . Nubain [Nalbuphine Hcl] Hives  . Stadol [Butorphanol] Hives     Past Medical History:  Diagnosis Date  . COPD (chronic obstructive pulmonary disease) ([place])   . Diabetes mellitus without complication ([place])   . Heart attack ([place])   . Hypertension   . 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 Sickle cell anemia ([facility])      Past Surgical History:  Procedure Laterality Date  . ANKLE SURGERY    . KNEE SURGERY    . PORT-A-CATH REMOVAL      Social History   Socioeconomic History  . Marital status: Single    Spouse name: Not on file  . Number of children: Not on file  . Years of education: Not on file  . Highest education level: Not on file  Occupational History  . Not on file  Tobacco Use  . Smoking status: Current Every Day Smoker  . Smokeless tobacco: Never Used  Substance and Sexual Activity  . Alcohol use: No  . Drug use: No  . Sexual activity: Not on file  Other Topics Concern  . Not on file  Social History Narrative  . Not on file   Social Determinants of Health   Financial Resource Strain:   . Difficulty of Paying Living Expenses:   Food Insecurity:   . Worried About Charity fundraiser in the Last Year:   . Arboriculturist in the Last Year:   Transportation Needs:   .  Film/video editor (Medical):   Marland Kitchen Lack of Transportation (Non-Medical):   Physical Activity:   . Days of Exercise per Week:   . Minutes of Exercise per Session:   Stress:   . Feeling of Stress :   Social Connections:   . Frequency of Communication with Friends and Family:   . Frequency of Social Gatherings with Friends and Family:   . Attends Religious Services:   . Active Member of Clubs or Organizations:   . Attends Archivist Meetings:   Marland Kitchen Marital Status:   Intimate Partner Violence:   . Fear of Current or Ex-Partner:   . Emotionally Abused:   Marland Kitchen Physically Abused:   . Sexually Abused:     No family history on file.   Current Facility-Administered Medications:  .  acetaminophen (TYLENOL) tablet 650 mg, 650 mg, Oral, Q6H PRN, 650 mg at 07/05/19 1940 **OR** acetaminophen (TYLENOL) suppository 650 mg, 650 mg, Rectal, Q6H PRN, Judd Gaudier V, MD .  cefTRIAXone (ROCEPHIN) 2 g in sodium chloride 0.9 % 100 mL IVPB, 2 g, Intravenous, Q12H, Tsosie Billing, MD, Stopped at 07/11/19 0939 .  chlorhexidine gluconate (MEDLINE KIT) (PERIDEX) 0.12 % solution 15 mL, 15 mL, Mouth Rinse, BID, Lanney Gins, Fuad, MD, 15 mL at 07/11/19 0857 .  Chlorhexidine Gluconate Cloth 2 % PADS 6 each, 6 each, Topical, Daily, Athena Masse, MD, 6 each at 07/10/19 2321 .  cloNIDine (CATAPRES - Dosed in mg/24 hr) patch 0.3 mg, 0.3 mg, Transdermal, Weekly, Kasa, Kurian, MD, 0.3 mg at 07/10/19 1701 .  dextrose 5 % with KCl 20 mEq / L  infusion, 20 mEq, Intravenous, Continuous, Kasa, Kurian, MD, Last Rate: 50 mL/hr at 07/11/19 1300, Rate Verify at 07/11/19 1300 .  diphenhydrAMINE (BENADRYL) injection 12.5 mg, 12.5 mg, Intravenous, Q6H PRN, 12.5 mg at 07/10/19 1659 **OR** diphenhydrAMINE (BENADRYL) 12.5 MG/5ML elixir 12.5 mg, 12.5 mg, Oral, Q6H PRN, Mortimer Fries, Kurian, MD .  enoxaparin (LOVENOX) injection 40 mg, 40 mg, Subcutaneous, BID, Kasa, Kurian, MD, 40 mg at 07/11/19 1150 .  folic acid injection 1  mg, 1 mg, Intravenous, Daily, Kasa, Kurian, MD .  hydrALAZINE (APRESOLINE) injection 10-20 mg, 10-20 mg, Intravenous, Q6H PRN, Awilda Bill, NP, 20 mg at 07/10/19 2017 .  HYDROmorphone (DILAUDID) 1 mg/mL PCA injection, , Intravenous, Q4H, Flora Lipps, MD, Stopped at 07/11/19 1152 .  insulin aspart (novoLOG) injection 0-20 Units, 0-20 Units, Subcutaneous, Q4H, Darel Hong D, NP, 3 Units at 07/11/19 [phone] .  levETIRAcetam (KEPPRA) IVPB 1000 mg/100 mL premix, 1,000 mg, Intravenous, Q12H, Alexis Goodell, MD, Stopped at 07/11/19 1012 .  MEDLINE mouth rinse, 15 mL, Mouth Rinse, 10 times per day, Ottie Glazier, MD, 15 mL at 07/11/19 1153 .  naloxone (NARCAN) injection 0.4 mg, 0.4 mg, Intravenous, PRN **AND** sodium chloride flush (NS) 0.9 % injection 9 mL, 9 mL, Intravenous, PRN, Mortimer Fries, Kurian, MD .  nystatin (MYCOSTATIN) 100000 UNIT/ML suspension 500,000 Units, 5 mL, Oral, QID, Darel Hong D, NP, 500,000 Units at 07/11/19 0900 .  ondansetron (ZOFRAN) injection 4 mg, 4 mg, Intravenous, Q6H PRN, Mortimer Fries, Kurian, MD .  pantoprazole (PROTONIX) injection 40 mg, 40 mg, Intravenous, Daily, Kasa, Kurian, MD, 40 mg at 07/11/19 0902 .  phenytoin (DILANTIN) 150 mg in sodium chloride 0.9 % 100 mL IVPB, 150 mg, Intravenous, Q8H, Alexis Goodell, MD, Stopped at 07/11/19 [phone] .  sodium chloride flush (NS) 0.9 % injection 10-40 mL, 10-40 mL, Intracatheter, Q12H, Darel Hong D, NP, 10 mL at 07/11/19 0904 .  thiamine (B-1) injection 100 mg, 100 mg, Intravenous, Daily, Kasa, Kurian, MD, 100 mg at 07/11/19 1151  Physical exam:  Vitals:   07/11/19 1100 07/11/19 1152 07/11/19 1200 07/11/19 1300  BP: (!) 111/57 (!) 111/57 112/70 121/70  Pulse: 77 82    Resp: (!) _0 Temp:  97.6 F (36.4 C)    TempSrc:  Oral    SpO2: 98% 94%  96%  Weight:      Height:       Patient is agitated and delirious.  He is responding to simple commands. Breathing nonlabored and saturating 96% on 2 L   CMP Latest Ref  Rng & Units 07/11/2019  Glucose 70 - 99 mg/dL 191(H)  BUN 6 - 20 mg/dL 18  Creatinine 0.61 - 1.24 mg/dL 0.85  Sodium 135 - 145 mmol/L 147(H)  Potassium 3.5 - 5.1 mmol/L 3.6  Chloride 98 - 111 mmol/L 113(H)  CO2 22 - 32 mmol/L 28  Calcium 8.9 - 10.3 mg/dL 8.4(L)  Total Protein 6.5 - 8.1 g/dL -  Total Bilirubin 0.3 - 1.2 mg/dL -  Alkaline Phos 38 - 126 U/L -  AST 15 - 41 U/L -  ALT 0 - 44 U/L -   CBC Latest Ref Rng & Units  07/11/2019  WBC 4.0 - 10.5 K/uL 22.9(H)  Hemoglobin 13.0 - 17.0 g/dL 11.0(L)  Hematocrit 39.0 - 52.0 % 34.1(L)  Platelets 150 - 400 K/uL 149(L)    _0 @  EEG  Result Date: 07/07/2019 Alexis Goodell, MD     07/07/2019  2:03 PM ELECTROENCEPHALOGRAM REPORT Patient: Jowel Waltner       Room #: IC13A-AA EEG No. ID: [id number] Age: 54 y.o.        Sex: male Requesting Physician: Swayze Report Date:  07/07/2019       Interpreting Physician: Alexis Goodell History: Ramces Shomaker is an 54 y.o. male with altered mental status and seizures Medications: Keppra, Dilantin, Acyclovir, Omnipen, Rocephin, Decadron, Insulin, Ativan Conditions of Recording:  This is a 21 channel routine scalp EEG performed with bipolar and monopolar montages arranged in accordance to the international 10/20 system of electrode placement. One channel was dedicated to EKG recording. The patient is in the altered state state. Description:  The background activity is slow and poorly organized.  It consists of a polymorphic delta activity that is diffusely distributed.  There is superimposed fast beta activity noted as well.  There is no noted hemispheric asymmetry. There is no noted epileptiform activity.  There are no incidences of eye deviation or head turning.  Hyperventilation and intermittent photic stimulation were not performed. IMPRESSION: This is an abnormal EEG secondary to general background slowing.  This finding may be seen with a diffuse disturbance that is etiologically nonspecific, but may include  a metabolic encephalopathy, among other possibilities.  No epileptiform activity was noted.  The superimposed beta activity noted is consistent with medication effect.  Alexis Goodell, MD Neurology [phone] 07/07/2019, 1:57 PM   EEG  Result Date: 07/06/2019 Alexis Goodell, MD     07/06/2019  4:17 PM ELECTROENCEPHALOGRAM REPORT Patient: Bennet Kujawa       Room #: IC13A-AA EEG No. ID: [id number] Age: 54 y.o.        Sex: male Requesting Physician: Jamse Arn Report Date:  07/06/2019       Interpreting Physician: Alexis Goodell History: Furkan Keenum is an 54 y.o. male with new onset seizures Medications: Acyclovir, Ampicillin, Suboxone, Rocephin, Decadron, Insulin, Keppra, Flomax, Protonix Conditions of Recording:  This is a 21 channel routine scalp EEG performed with bipolar and monopolar montages arranged in accordance to the international 10/20 system of electrode placement. One channel was dedicated to EKG recording. The patient is in the poorly responsive state. Description:  The background activity is slow and poorly organized.  This slow activity consists of a low voltage delta activity that is diffusely distributed.  There is superimposed beta activity noted as well.  The patient is positioned on his left.  Despite this there does appear some intermittent further slowing over the right temporoparietal region.   Frequent sharp waves are noted in this region as well.  The patient has periods when he further turns his head to the left and extensive artifact is noted.  Patient is able to respond to questioning at these times.  Patient also has periods of build up in beta activity over the right hemisphere without change clinically.  Hyperventilation and intermittent photic stimulation were not performed. IMPRESSION: This is an abnormal electroencephalogram secondary to general background slowing with focal slowing and sharp waves intermittently noted in the right temporoparietal region.  This is  consistent with the patient's history of focal left sided seizures.  Patient also with episodes of fast beta activity-some with head turning but responsiveness and  some with no change in clinical activity.  Difficult clear characterization but can not rule out focal seizure activity.  Alexis Goodell, MD Neurology [phone] 07/06/2019, 4:04 PM   CT Angio Head W or Wo Contrast  Result Date: 07/04/2019 CLINICAL DATA:  Weakness, syncope. EXAM: CT ANGIOGRAPHY HEAD AND NECK TECHNIQUE: Multidetector CT imaging of the head and neck was performed using the standard protocol during bolus administration of intravenous contrast. Multiplanar CT image reconstructions and MIPs were obtained to evaluate the vascular anatomy. Carotid stenosis measurements (when applicable) are obtained utilizing NASCET criteria, using the distal internal carotid diameter as the denominator. CONTRAST:  21m OMNIPAQUE IOHEXOL 350 MG/ML SOLN COMPARISON:  None. FINDINGS: CT HEAD FINDINGS Brain: There is no mass, hemorrhage or extra-axial collection. The size and configuration of the ventricles and extra-axial CSF spaces are normal. 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 The brain parenchyma is normal. Skull: The visualized skull base, calvarium and extracranial soft tissues are normal. Sinuses/Orbits: No fluid levels or advanced mucosal thickening of the visualized paranasal sinuses. No mastoid or middle ear effusion. The orbits are normal. CTA NECK FINDINGS SKELETON: There is no bony spinal canal stenosis. No lytic or blastic lesion. OTHER NECK: Normal pharynx, larynx and major salivary glands. No cervical lymphadenopathy. Unremarkable thyroid gland. UPPER CHEST: No pneumothorax or pleural effusion. No nodules or masses. AORTIC ARCH: There is no calcific atherosclerosis of the aortic arch. There is no aneurysm, dissection or hemodynamically significant stenosis of the visualized portion of the aorta. Conventional 3 vessel aortic  branching pattern. The visualized proximal subclavian arteries are widely patent. RIGHT CAROTID SYSTEM: Normal without aneurysm, dissection or stenosis. LEFT CAROTID SYSTEM: Normal without aneurysm, dissection or stenosis. VERTEBRAL ARTERIES: Codominant configuration. Both origins are clearly patent. There is no dissection, occlusion or flow-limiting stenosis to the skull base (V1-V3 segments). CTA HEAD FINDINGS POSTERIOR CIRCULATION: --Vertebral arteries: Normal V4 segments. --Posterior inferior cerebellar arteries (PICA): Patent origins from the vertebral arteries. --Anterior inferior cerebellar arteries (AICA): Patent origins from the basilar artery. --Basilar artery: Normal. --Superior cerebellar arteries: Normal. --Posterior cerebral arteries: Normal. Both originate from the basilar artery. Posterior communicating arteries (p-comm) are diminutive or absent. ANTERIOR CIRCULATION: --Intracranial internal carotid arteries: Normal. --Anterior cerebral arteries (ACA): Normal. Both A1 segments are present. Patent anterior communicating artery (a-comm). --Middle cerebral arteries (MCA): Normal. VENOUS SINUSES: As permitted by contrast timing, patent. ANATOMIC VARIANTS: None Review of the MIP images confirms the above findings. IMPRESSION: 1. No emergent large vessel occlusion or high-grade stenosis. 2. 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 Electronically Signed   By: KUlyses JarredM.D.   On: 07/04/2019 21:25   DG Abd 1 View  Result Date: 07/07/2019 CLINICAL DATA:  Orogastric tube placement. EXAM: ABDOMEN - 1 VIEW COMPARISON:  Same day. FINDINGS: The bowel gas pattern is normal. Distal tip of enteric tube is seen in expected position of proximal stomach. No radio-opaque calculi or other significant radiographic abnormality are seen. IMPRESSION: Distal tip of enteric tube seen in expected position of proximal stomach. Electronically Signed   By: JMarijo ConceptionM.D.   On: 07/07/2019 15:18   DG Abd 1  View  Result Date: 07/07/2019 CLINICAL DATA:  Orogastric tube placement. EXAM: ABDOMEN - 1 VIEW COMPARISON:  None. FINDINGS: The bowel gas pattern is normal. Distal tip of enteric tube is seen in expected position of distal esophagus. No radio-opaque calculi or other significant radiographic abnormality are seen. IMPRESSION: Distal tip of enteric tube is seen in expected position of distal esophagus.  No evidence of bowel obstruction or ileus. Electronically Signed   By: Marijo Conception M.D.   On: 07/07/2019 13:50   DG Abd 1 View  Result Date: 07/06/2019 CLINICAL DATA:  Possible heavy metal poisoning EXAM: ABDOMEN - 1 VIEW COMPARISON:  None. FINDINGS: Scattered large and small bowel gas is noted. Mild retained fecal material is noted. No intraluminal radiopacities are seen. Diffuse thickening of the femoral cortex is noted bilaterally in a symmetrical fashion. This may be related to the patient's underlying sickle cell disease or possibly underlying Paget's. No other focal abnormality is seen. IMPRESSION: Thickened cortex within the femurs in a symmetrical fashion bilaterally likely related to the patient's underlying sickle cell disease or possible Paget's disease. No definitive radiopacities are noted within the bowel to correspond with the given clinical history. Electronically Signed   By: Inez Catalina M.D.   On: 07/06/2019 16:12   CT Head Wo Contrast  Result Date: 07/04/2019 CLINICAL DATA:  54 year old male with history of headache. Multiple falls. Seizures. EXAM: CT HEAD WITHOUT CONTRAST TECHNIQUE: Contiguous axial images were obtained from the base of the skull through the vertex without intravenous contrast. COMPARISON:  Head CT 07/03/2019. FINDINGS: Brain: Patchy and confluent areas of decreased attenuation are noted throughout the deep and periventricular white matter of the cerebral hemispheres bilaterally, compatible with chronic microvascular ischemic disease. No evidence of acute infarction,  hemorrhage, hydrocephalus, extra-axial collection or mass lesion/mass effect. Vascular: No hyperdense vessel or unexpected calcification. Skull: Normal. Negative for fracture or focal lesion. Sinuses/Orbits: No acute finding. Other: None. IMPRESSION: 1. No acute intracranial abnormalities. 2. Chronic microvascular ischemic changes in the cerebral white matter, as above. Electronically Signed   By: Vinnie Langton M.D.   On: 07/04/2019 18:13   CT Head Wo Contrast  Result Date: 07/03/2019 CLINICAL DATA:  Recent fall with headaches EXAM: CT HEAD WITHOUT CONTRAST TECHNIQUE: Contiguous axial images were obtained from the base of the skull through the vertex without intravenous contrast. COMPARISON:  None. FINDINGS: Brain: Mild atrophic changes are noted. Areas of prior ischemia are seen in the frontal lobes bilaterally. No findings to suggest acute hemorrhage, acute infarction or space-occupying mass lesion are seen. Vascular: No hyperdense vessel or unexpected calcification. Skull: Normal. Negative for fracture or focal lesion. Sinuses/Orbits: No acute finding. Other: None. IMPRESSION: Mild atrophic changes and areas of prior ischemia without acute abnormality. Electronically Signed   By: Inez Catalina M.D.   On: 07/03/2019 23:13   CT Angio Neck W and/or Wo Contrast  Result Date: 07/04/2019 CLINICAL DATA:  Weakness, syncope. EXAM: CT ANGIOGRAPHY HEAD AND NECK TECHNIQUE: Multidetector CT imaging of the head and neck was performed using the standard protocol during bolus administration of intravenous contrast. Multiplanar CT image reconstructions and MIPs were obtained to evaluate the vascular anatomy. Carotid stenosis measurements (when applicable) are obtained utilizing NASCET criteria, using the distal internal carotid diameter as the denominator. CONTRAST:  25m OMNIPAQUE IOHEXOL 350 MG/ML SOLN COMPARISON:  None. FINDINGS: CT HEAD FINDINGS Brain: There is no mass, hemorrhage or extra-axial collection. The size  and configuration of the ventricles and extra-axial CSF spaces are normal. 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 The brain parenchyma is normal. Skull: The visualized skull base, calvarium and extracranial soft tissues are normal. Sinuses/Orbits: No fluid levels or advanced mucosal thickening of the visualized paranasal sinuses. No mastoid or middle ear effusion. The orbits are normal. CTA NECK FINDINGS SKELETON: There is no bony spinal canal stenosis. No lytic or blastic lesion. OTHER  NECK: Normal pharynx, larynx and major salivary glands. No cervical lymphadenopathy. Unremarkable thyroid gland. UPPER CHEST: No pneumothorax or pleural effusion. No nodules or masses. AORTIC ARCH: There is no calcific atherosclerosis of the aortic arch. There is no aneurysm, dissection or hemodynamically significant stenosis of the visualized portion of the aorta. Conventional 3 vessel aortic branching pattern. The visualized proximal subclavian arteries are widely patent. RIGHT CAROTID SYSTEM: Normal without aneurysm, dissection or stenosis. LEFT CAROTID SYSTEM: Normal without aneurysm, dissection or stenosis. VERTEBRAL ARTERIES: Codominant configuration. Both origins are clearly patent. There is no dissection, occlusion or flow-limiting stenosis to the skull base (V1-V3 segments). CTA HEAD FINDINGS POSTERIOR CIRCULATION: --Vertebral arteries: Normal V4 segments. --Posterior inferior cerebellar arteries (PICA): Patent origins from the vertebral arteries. --Anterior inferior cerebellar arteries (AICA): Patent origins from the basilar artery. --Basilar artery: Normal. --Superior cerebellar arteries: Normal. --Posterior cerebral arteries: Normal. Both originate from the basilar artery. Posterior communicating arteries (p-comm) are diminutive or absent. ANTERIOR CIRCULATION: --Intracranial internal carotid arteries: Normal. --Anterior cerebral arteries (ACA): Normal. Both A1 segments are present. Patent anterior communicating  artery (a-comm). --Middle cerebral arteries (MCA): Normal. VENOUS SINUSES: As permitted by contrast timing, patent. ANATOMIC VARIANTS: None Review of the MIP images confirms the above findings. IMPRESSION: 1. No emergent large vessel occlusion or high-grade stenosis. 2. 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 Electronically Signed   By: Ulyses Jarred M.D.   On: 07/04/2019 21:25   MR BRAIN WO CONTRAST  Addendum Date: 07/06/2019   ADDENDUM REPORT: 07/06/2019 18:10 ADDENDUM: These results were called by telephone at the time of interpretation on 07/06/2019 at 6:10 pm to provider [facility] , who verbally acknowledged these results. Electronically Signed   By: Kellie Simmering DO   On: 07/06/2019 18:10   Result Date: 07/06/2019 CLINICAL DATA:  Ophthalmoplegia; encephalopathy, seizure, abnormal neuro exam. Additional history provided: Prior history of sickle thalassemia, COPD, diabetes and hypertension, presenting with headache and aching all over, altered at times with intermittent shaking of left arm and leg with deviation head to the left while awake, findings felt to reflect partial seizure. EXAM: MRI HEAD WITHOUT CONTRAST TECHNIQUE: Multiplanar, multiecho pulse sequences of the brain and surrounding structures were obtained without intravenous contrast. COMPARISON:  CT venogram 07/04/2019, noncontrast head CT and CT angiogram head/neck 07/04/2019 FINDINGS: Brain: The patient was unable to tolerate the full examination. As a result only axial and coronal diffusion-weighted imaging, a sagittal T1 weighted sequence, an axial T2* sequence, an axial T2 FLAIR sequence and a coronal T2 weighted sequence were obtained. There is moderate/severe motion degradation of the sagittal T1 weighted sequence and axial T2 FLAIR sequence. There is severe motion degradation of the coronal T2 weighted sequence. There is subtle cortical diffusion-weighted signal abnormality throughout much of the right parietal lobe and  extending inferiorly to involve portions of the right occipital lobe and posteromedial right temporal lobe. There are a few more punctate foci of restricted diffusion within the high right parietal lobe (series 5, image 30). Evaluation is limited due to significant motion degradation, but there appears to be corresponding T2/FLAIR hyperintensity at these sites. 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 Multifocal T2/FLAIR hyperintensity within the cerebral white matter and pons is nonspecific but likely reflect sequela of chronic small vessel ischemia. There is suggestion of a thin right-sided subdural hygroma measuring approximately 5 mm in thickness greatest overlying the right frontal lobe (for instance as seen on series 8, image 16). This finding was not definitively present on prior exams. No  intracranial mass is identified. No midline shift. No chronic intracranial blood products. Vascular: Poorly assessed on the acquired sequences. Skull and upper cervical spine: No focal marrow lesion is identified within the limitations of significant motion degradation. Sinuses/Orbits: The orbits and paranasal sinuses are poorly assessed due to the degree of motion degradation. IMPRESSION: 1. Cortical diffusion-weighted signal abnormality and corresponding T2/FLAIR hyperintensity involving much of the right parietal lobe and extending inferiorly to involve portions of the right occipital lobe and posteromedial right temporal lobe. There are a few additional punctate foci of restricted diffusion within the high right parietal lobe. Findings are suspicious for seizure related changes and/or cerebritis. Acute ischemia cannot be excluded, particularly for the punctate foci within the high right parietal lobe. 2. 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 This finding was not definitively present on prior exams. 3. Small chronic cortical/subcortical infarcts within the bilateral  frontal lobes. 4. Chronic small vessel ischemic changes within the cerebral white matter and pons. Electronically Signed: By: Kellie Simmering DO On: 07/06/2019 17:57   MR BRAIN W WO CONTRAST  Result Date: 07/08/2019 CLINICAL DATA:  Seizures. EXAM: MRI HEAD WITHOUT AND WITH CONTRAST TECHNIQUE: Multiplanar, multiecho pulse sequences of the brain and surrounding structures were obtained without and with intravenous contrast. CONTRAST:  14m GADAVIST GADOBUTROL 1 MMOL/ML IV SOLN COMPARISON:  Brain MRI 07/06/2019 FINDINGS: Brain: Cortical diffusion abnormality within the right hemisphere has resolved aside from a small area in the right occipital lobe. Areas of encephalomalacia in both frontal lobes are unchanged. There is mild multifocal white matter hyperintensity. There is an old left cerebellar infarct. No chronic microhemorrhage. Normal midline structures. 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 Skull and upper cervical spine: Normal marrow signal. Sinuses/Orbits: Negative. Other: None. IMPRESSION: 1. No acute intracranial abnormality. 2. Cortical diffusion abnormality within the right hemisphere has resolved aside from a small area in the right occipital lobe. This may indicate postictal change or small area of subacute ischemia. 3. Unchanged areas of encephalomalacia in the frontal lobes bilaterally. Old left cerebellar infarct. Electronically Signed   By: KUlyses JarredM.D.   On: 07/08/2019 00:45   DG Chest Port 1 View  Result Date: 07/10/2019 CLINICAL DATA:  Intubation check EXAM: PORTABLE CHEST 1 VIEW COMPARISON:  05/10/2019, 4:49 a.m. FINDINGS: Endotracheal tube is slightly retracted, measuring approximately 6.5 cm above the carina although still below the thoracic inlet. Esophagogastric tube poorly visualized on underpenetrated examination although appears remain below the diaphragm. Unchanged mild, diffuse interstitial opacity. No new airspace opacity.  IMPRESSION: Endotracheal tube is slightly retracted, measuring approximately 6.5 cm above the carina although still below the thoracic inlet. Electronically Signed   By: AEddie CandleM.D.   On: 07/10/2019 11:25   DG Chest Port 1 View  Result Date: 07/10/2019 CLINICAL DATA:  Acute respiratory failure EXAM: PORTABLE CHEST 1 VIEW COMPARISON:  Radiograph 07/04/2019 FINDINGS: *Endotracheal tube in the mid trachea, 5 cm from the carina. *Transesophageal tube curling in the left upper quadrant. Nonvisualization of the side port. *Telemetry leads overlie the chest. Persistent cardiomegaly and prominence of the upper mediastinum. Diffuse interstitial opacity throughout both lungs with a basilar gradient. Progressive elevation the right hemidiaphragm could reflect developing sub pulmonic effusion or right basilar volume loss. No pneumothorax. No acute osseous or soft tissue abnormality. Degenerative changes are present in the imaged spine and shoulders. IMPRESSION: 1. Progressive elevation of the right hemidiaphragm could reflect developing subpulmonic effusion or increasing right basilar volume  loss. 2. Diffuse interstitial opacity throughout both lungs with a basilar gradient, could reflect edema or infection. 3. Cardiomegaly. 4. Lines and tubes as above. Electronically Signed   By: Lovena Le M.D.   On: 07/10/2019 06:53   DG Chest Port 1 View  Result Date: 07/08/2019 CLINICAL DATA:  Headache, aching all over, COPD, smoker EXAM: PORTABLE CHEST 1 VIEW COMPARISON:  Portable exam 1146 hours compared to 07/07/2019 FINDINGS: Tip of endotracheal tube projects 6.1 cm above carina. Nasogastric tube extends into stomach. Enlargement of cardiac silhouette. Stable mediastinal contours. Scattered infiltrates in both lungs, favor mild pulmonary edema over infection. Subsegmental atelectasis RIGHT base. No pleural effusion or pneumothorax. IMPRESSION: Enlargement of cardiac silhouette with BILATERAL pulmonary infiltrates question  pulmonary edema. RIGHT basilar atelectasis. Electronically Signed   By: Lavonia Dana M.D.   On: 07/08/2019 12:57   DG Chest Port 1 View  Result Date: 07/07/2019 CLINICAL DATA:  Central line placement EXAM: PORTABLE CHEST 1 VIEW COMPARISON:  Radiographs 09/19/2018 FINDINGS: Endotracheal tube terminates in the mid trachea, 4.5 cm from the carina. Left IJ approach central venous catheter tip terminates at the brachiocephalic-caval confluence. Transesophageal tube tip again seen terminating in the proximal stomach with the side port at the level of the GE junction. Telemetry leads overlie the chest. Stable cardiomegaly with central vascular congestion and diffuse mixed interstitial and patchy airspace opacity concerning for interstitial and alveolar pulmonary edema. Hazy obscuration of the right hemidiaphragm may reflect some trace effusion. No pneumothorax. No acute osseous or soft tissue abnormality. Sclerotic changes of the left humeral head likely reflect sequela of prior avascular necrosis. Additional degenerative changes in the left shoulder and spine. IMPRESSION: 1. Left IJ approach central venous catheter tip at the left brachiocephalic-caval confluence. 2. Endotracheal tube terminates in the mid trachea, 4.5 cm from the carina. 3. Transesophageal tube tip again seen terminating in the proximal stomach with the side port at the level of the GE junction. Consider advancing 3 cm to position in the gastric body. 4. Persistent features suggestive of interstitial and alveolar edema. Likely developing right pleural effusion. Electronically Signed   By: Lovena Le M.D.   On: 07/07/2019 22:32   Portable Chest x-ray  Result Date: 07/07/2019 CLINICAL DATA:  Endotracheal tube placement. EXAM: PORTABLE CHEST 1 VIEW COMPARISON:  July 04, 2019. FINDINGS: Stable cardiomegaly with central pulmonary vascular congestion. Endotracheal and nasogastric tubes are unchanged in position. Increased bilateral interstitial  densities are noted concerning for pulmonary edema. No pneumothorax or pleural effusion is noted. Bony thorax is unremarkable. IMPRESSION: Stable support apparatus. Stable cardiomegaly with central pulmonary vascular congestion. Increased bilateral interstitial densities are noted concerning for pulmonary edema. Electronically Signed   By: Marijo Conception M.D.   On: 07/07/2019 13:50   DG Chest Portable 1 View  Result Date: 07/04/2019 CLINICAL DATA:  Chest pain EXAM: PORTABLE CHEST 1 VIEW COMPARISON:  10/04/2018, 12/13/2017 FINDINGS: Cardiomegaly. Mild diffuse coarse chronic interstitial opacity. No acute focal airspace disease or effusion. Aortic atherosclerosis. No pneumothorax. IMPRESSION: Cardiomegaly. No edema or infiltrate. Mild diffuse coarse chronic interstitial opacity Electronically Signed   By: Donavan Foil M.D.   On: 07/04/2019 18:19   ECHOCARDIOGRAM COMPLETE  Result Date: 07/08/2019    ECHOCARDIOGRAM REPORT   Patient Name:   ADEMOLA VERT Date of Exam: 07/08/2019 Medical Rec #:  [id number]       Height:       75.0 in Accession #:    [id number]      Weight:  226.4 lb Date of Birth:  April 19, 1965        BSA:          2.313 m Patient Age:    46 years        BP:           96/62 mmHg Patient Gender: M               HR:           105 bpm. Exam Location:  [facility] Procedure: 2D Echo and Intracardiac Opacification Agent Indications:     Acute Respiratory Insufficiency 518.82/ R06.89  History:         Patient has no prior history of Echocardiogram examinations.  Sonographer:     Arville Go RDCS Referring Phys:  [id number] Bradly Bienenstock Diagnosing Phys: Skeet Latch MD  Sonographer Comments: Technically challenging study due to limited acoustic windows, Technically difficult study due to poor echo windows, no subcostal window and echo performed with patient supine and on artificial respirator. Image acquisition challenging due to patient body habitus. IMPRESSIONS  1. Left ventricular ejection  fraction, by estimation, is 55 to 60%. The left ventricle has normal function. The left ventricle has no regional wall motion abnormalities. There is mild concentric left ventricular hypertrophy. Left ventricular diastolic parameters are consistent with Grade I diastolic dysfunction (impaired relaxation).  2. Right ventricular systolic function is normal. The right ventricular size is normal. There is normal pulmonary artery systolic pressure.  3. The mitral valve is normal in structure. Trivial mitral valve regurgitation. No evidence of mitral stenosis.  4. The aortic valve is normal in structure. Aortic valve regurgitation is not visualized. Mild aortic valve stenosis. Aortic valve area, by VTI measures 1.68 cm. Aortic valve mean gradient measures 10.0 mmHg. Aortic valve Vmax measures 2.22 m/s.  5. The inferior vena cava is normal in size with greater than 50% respiratory variability, suggesting right atrial pressure of 3 mmHg. FINDINGS  Left Ventricle: Left ventricular ejection fraction, by estimation, is 55 to 60%. The left ventricle has normal function. The left ventricle has no regional wall motion abnormalities. Definity contrast agent was given IV to delineate the left ventricular  endocardial borders. The left ventricular internal cavity size was normal in size. There is mild concentric left ventricular hypertrophy. Left ventricular diastolic parameters are consistent with Grade I diastolic dysfunction (impaired relaxation). Indeterminate filling pressures. Right Ventricle: The right ventricular size is normal. No increase in right ventricular wall thickness. Right ventricular systolic function is normal. There is normal pulmonary artery systolic pressure. The tricuspid regurgitant velocity is 1.59 m/s, and  with an assumed right atrial pressure of 10 mmHg, the estimated right ventricular systolic pressure is 29.9 mmHg. Left Atrium: Left atrial size was normal in size. Right Atrium: Right atrial size was  normal in size. Pericardium: There is no evidence of pericardial effusion. Mitral Valve: The mitral valve is normal in structure. Normal mobility of the mitral valve leaflets. Trivial mitral valve regurgitation. No evidence of mitral valve stenosis. Tricuspid Valve: The tricuspid valve is normal in structure. Tricuspid valve regurgitation is trivial. No evidence of tricuspid stenosis. Aortic Valve: The aortic valve is normal in structure.. There is mild thickening and mild calcification of the aortic valve. Aortic valve regurgitation is not visualized. Mild aortic stenosis is present. There is mild thickening of the aortic valve. There is mild calcification of the aortic valve. Aortic valve mean gradient measures 10.0 mmHg. Aortic valve peak gradient measures 19.7 mmHg. Aortic valve  area, by VTI measures 1.68 cm. Pulmonic Valve: The pulmonic valve was normal in structure. Pulmonic valve regurgitation is not visualized. No evidence of pulmonic stenosis. Aorta: The aortic root is normal in size and structure. Venous: The inferior vena cava is normal in size with greater than 50% respiratory variability, suggesting right atrial pressure of 3 mmHg. IAS/Shunts: No atrial level shunt detected by color flow Doppler.  LEFT VENTRICLE PLAX 2D LVIDd:         5.01 cm  Diastology LVIDs:         3.30 cm  LV e' lateral:   6.31 cm/s LV PW:         1.30 cm  LV E/e' lateral: 13.4 LV IVS:        1.37 cm  LV e' medial:    8.05 cm/s LVOT diam:     2.10 cm  LV E/e' medial:  10.5 LV SV:         63 LV SV Index:   27 LVOT Area:     3.46 cm  RIGHT VENTRICLE RV Basal diam:  3.37 cm RV S prime:     19.80 cm/s LEFT ATRIUM           Index       RIGHT ATRIUM           Index LA diam:      4.40 cm 1.90 cm/m  RA Area:     15.70 cm LA Vol (A4C): 44.9 ml 19.41 ml/m RA Volume:   42.90 ml  18.55 ml/m  AORTIC VALVE AV Area (Vmax):    1.49 cm AV Area (Vmean):   1.70 cm AV Area (VTI):     1.68 cm AV Vmax:           222.00 cm/s AV Vmean:           144.000 cm/s AV VTI:            0.373 m AV Peak Grad:      19.7 mmHg AV Mean Grad:      10.0 mmHg LVOT Vmax:         95.30 cm/s LVOT Vmean:        70.600 cm/s LVOT VTI:          0.181 m LVOT/AV VTI ratio: 0.49  AORTA Ao Root diam: 3.50 cm MITRAL VALVE                TRICUSPID VALVE MV Area (PHT): 5.84 cm     TR Peak grad:   10.1 mmHg MV Decel Time: 130 msec     TR Vmax:        159.00 cm/s MV E velocity: 84.80 cm/s MV A velocity: 106.00 cm/s  SHUNTS MV E/A ratio:  0.80         Systemic VTI:  0.18 m                             Systemic Diam: 2.10 cm Skeet Latch MD Electronically signed by Skeet Latch MD Signature Date/Time: 07/08/2019/12:08:49 PM    Final    CT VENOGRAM HEAD  Result Date: 07/04/2019 CLINICAL DATA:  Syncope and weakness EXAM: CT VENOGRAM HEAD TECHNIQUE: Vena graphic images of the head were obtained following the administration of intravenous contrast material. Multiplanar reformats and maximum intensity projections were constructed. CONTRAST:  43m OMNIPAQUE IOHEXOL 350 MG/ML SOLN COMPARISON:  CTA head neck same day FINDINGS: Superior sagittal  sinus: Normal. Straight sinus: Normal. Inferior sagittal sinus, vein of Galen and internal cerebral veins: Normal. Transverse sinuses: Normal. Sigmoid sinuses: Normal. Visualized jugular veins: Normal. IMPRESSION: Normal head CT venogram. Electronically Signed   By: Ulyses Jarred M.D.   On: 07/04/2019 21:27   US SPLEEN (ABDOMEN LIMITED)  Result Date: 07/08/2019 CLINICAL DATA:  Sickle cell disease, beta thalassemia EXAM: ULTRASOUND ABDOMEN LIMITED COMPARISON:  None. FINDINGS: Spleen atrophic, 4.4 cm length. Heterogeneous splenic echogenicity without focal mass. No LEFT upper quadrant free fluid. 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 No LEFT upper quadrant free fluid. Electronically Signed   By: Lavonia Dana M.D.   On: 07/08/2019 14:04   DG FL GUIDED LUMBAR  PUNCTURE  Result Date: 07/07/2019 CLINICAL DATA:  Lumbar puncture. EXAM: DIAGNOSTIC LUMBAR PUNCTURE UNDER FLUOROSCOPIC GUIDANCE FLUOROSCOPY TIME:  Fluoroscopy Time:  0 minutes 24 seconds Radiation Exposure Index (if provided by the fluoroscopic device): 11.1 mGy PROCEDURE: After discussing the risks and benefits of this procedure with the patient's sister informed consent was obtained. Back was sterilely prepped and draped. 22 gauge needle was advanced into the L4-L5 space and clear CSF obtained. 8 cc obtained and sent in 4 separate tubes to the ordered labs. Needle withdrawn. Hemostasis achieved. No complications. IMPRESSION: Successful fluoroscopically directed lumbar puncture. Electronically Signed   By: Marcello Moores  Register   On: 07/07/2019 17:05     Assessment and plan- Patient is a 54 y.o. male with history of sickle cell beta plus thalassemia admitted for ophthalmoplegia headache found to have seizures  1. Repeat MRI that was done on 07/07/2019 showed no acute intracranial abnormality but cortical diffusion abnormality in the right hemisphere that had resolved aside from small area in the right occipital lobe.  This may indicate post ictal change or small area of subacute ischemia.  I have spoken to Dr. Rockne Coons who is patient's outpatient hematologist today.  I have also communicated my thoughts to the rest of the team including the ICU team Dr. Mortimer Fries, Dr. Delaine Lame and neurology Dr. Irish Elders.  Question is what has precipitated the seizure is infection versus PRES versus ischemic event. Per ID infection is less likely. Per neuro MRI changes not in the usual vascular territory for strokes  If ischemia cannot be ruled out in the setting of underlying sickle cell beta thalassemia-transfer to a tertiary center preferably [facility] would be warranted to see if the patient would benefit from exchange transfusion which is not offered here at [facility].  Patient if transferred would need to go to ICU/neuro ICU  with hematology consulting if exchange transfusion would be warranted or not  If we think that the seizure was precipitated due to PRES-it does not necessarily warrant exchange transfusion  Clinically patient is presently extubated and is able to follow some commands although he remains agitated and delirious.  2.  Sickle cell beta plus thalassemia: Patient received 1 unit ofLeukopoor red blood cells on 07/08/2019 and since then his hemoglobin is presently stable between 10-11.minimize the use of opioids as patient was not on outpatient opioids. He was doing well on suboxone.recommend switching back to that regimen if patient can take sublingual suboxone    Visit Diagnosis 1. Chronic pain syndrome   2. New onset seizure ([place])   3. Severe headache   4. Other elevated white blood cell (WBC) count   5. Encephalopathy   6. Heavy metal poison. screen   7. Endotracheal tube present   8. Encounter  for nasogastric (NG) tube placement   9. Encounter for imaging study to confirm orogastric (OG) tube placement   10. Seizure ([place])   11. Encounter for central line placement   12. Sickle cell beta thalassemia ([facility])   13. Abnormal CXR   14. Acute respiratory failure ([place])   15. Endotracheally intubated      Dr. Randa Evens, MD, MPH [facility] [id number] 07/11/2019 1:07 PM

## 2019-07-11 NOTE — Progress Notes (Signed)
Subjective:   S/p extubation yesterday.  Agitated and not following commands.      Objective: Current vital signs: BP (!) 111/57   Pulse 77   Temp (!) 97.5 F (36.4 C) (Oral)   Resp (!) 22   Ht '6\' 3"'  (1.905 m)   Wt 102.7 kg   SpO2 98%   BMI 28.30 kg/m  Vital signs in last 24 hours: Temp:  [92.1 F (33.4 C)-99.2 F (37.3 C)] 97.5 F (36.4 C) (05/04 1057) Pulse Rate:  [50-99] 77 (05/04 1100) Resp:  [10-114] 22 (05/04 1100) BP: (99-176)/(57-106) 111/57 (05/04 1100) SpO2:  [85 %-98 %] 98 % (05/04 1100) FiO2 (%):  [0 %] 0 % (05/04 0445)  Intake/Output from previous day: 05/03 0701 - 05/04 0700 In: 2287.6 [I.V.:550.7; NG/GT:1200; IV Piggyback:536.9] Out: 3200 [Urine:2800; Emesis/NG output:400] Intake/Output this shift: Total I/O In: 336.2 [I.V.:117; IV Piggyback:219.2] Out: -  Nutritional status:  Diet Order    None      Neurologic Exam: Mental Status: Agitated, arrousable. Follows commands.  Moans  Cranial Nerves: II: Pupils reactive bilaterally III,IV,VI: Oculocephalic response present bilaterally. NO gaze preference V,VII: corneal reflex present bilaterally Motor: Moves all extremities on his own   Lab Results: Basic Metabolic Panel: Recent Labs  Lab 07/07/19 0237 07/07/19 0237 07/08/19 0600 07/08/19 0600 07/09/19 0344 07/10/19 0338 07/11/19 0424  NA 145  --  150*  --  149* 146* 147*  K 4.2  --  3.4*  --  4.3 4.1 3.6  CL 114*  --  119*  --  119* 115* 113*  CO2 23  --  26  --  '25 26 28  ' GLUCOSE 297*  --  160*  --  224* 199* 191*  BUN 21*  --  21*  --  '17 16 18  ' CREATININE 1.02  --  1.08  --  1.25* 0.98 0.85  CALCIUM 8.5*   < > 8.3*   < > 8.0* 8.1* 8.4*  MG  --   --  2.5*  --   --  1.9 2.1  PHOS  --   --  2.3*  --   --  3.5 2.6   < > = values in this interval not displayed.    Liver Function Tests: Recent Labs  Lab 07/04/19 1832 07/07/19 0237 07/08/19 1139  AST 27 12* 12*  ALT '17 15 11  ' ALKPHOS 116 80 71  BILITOT 1.6* 1.2 0.9  PROT  8.1 6.6 5.5*  ALBUMIN 3.9 2.9* 2.6*   No results for input(s): LIPASE, AMYLASE in the last 168 hours. No results for input(s): AMMONIA in the last 168 hours.  CBC: Recent Labs  Lab 07/04/19 1832 07/05/19 0743 07/07/19 0237 07/07/19 0237 07/08/19 0345 07/08/19 2250 07/09/19 0344 07/10/19 0338 07/11/19 0424  WBC 22.5*   < > 19.0*  --  19.2*  --  18.6* 19.6* 22.9*  NEUTROABS 17.0*  --   --   --   --   --   --   --  14.2*  HGB 11.6*   < > 10.7*   < > 7.9* 9.7* 10.7* 10.1* 11.0*  HCT 35.0*   < > 32.5*   < > 24.7* 30.6* 33.5* 31.7* 34.1*  MCV 65.8*   < > 66.2*  --  67.5*  --  69.5* 69.4* 69.3*  PLT 352   < > 283  --  181  --  200 173 149*   < > = values in this interval not displayed.  Cardiac Enzymes: No results for input(s): CKTOTAL, CKMB, CKMBINDEX, TROPONINI in the last 168 hours.  Lipid Panel: Recent Labs  Lab 07/07/19 1435 07/10/19 1106  TRIG 88 78    CBG: Recent Labs  Lab 07/10/19 2011 07/11/19 0010 07/11/19 0438 07/11/19 0725 07/11/19 1129  GLUCAP 194* 192* 169* 134* 119*    Microbiology: Results for orders placed or performed during the hospital encounter of 07/04/19  Blood culture (routine x 2)     Status: None   Collection Time: 07/04/19  8:24 PM   Specimen: BLOOD  Result Value Ref Range Status   Specimen Description BLOOD RIGHT ANTECUBITAL  Final   Special Requests   Final    BOTTLES DRAWN AEROBIC AND ANAEROBIC Blood Culture adequate volume   Culture   Final    NO GROWTH 5 DAYS Performed at [facility], [address], [place], [place] [address]    Report Status 07/09/2019 FINAL  Final  Blood culture (routine x 2)     Status: None   Collection Time: 07/04/19  8:27 PM   Specimen: BLOOD  Result Value Ref Range Status   Specimen Description BLOOD LEFT ANTECUBITAL  Final   Special Requests   Final    BOTTLES DRAWN AEROBIC AND ANAEROBIC Blood Culture adequate volume   Culture   Final    NO GROWTH 5 DAYS Performed at [facility], [address] [place], [place] [address]    Report Status 07/09/2019 FINAL  Final  Respiratory Panel by RT PCR (Flu A&B, Covid) - Nasopharyngeal Swab     Status: None   Collection Time: 07/04/19  9:34 PM   Specimen: Nasopharyngeal Swab  Result Value Ref Range Status   SARS Coronavirus 2 by RT PCR NEGATIVE NEGATIVE Final    Comment: (NOTE) SARS-CoV-2 target nucleic acids are NOT DETECTED. The SARS-CoV-2 RNA is generally detectable in upper respiratoy specimens during the acute phase of infection. The lowest concentration of SARS-CoV-2 viral copies this assay can detect is 131 copies/mL. A negative result does not preclude SARS-Cov-2 infection and should not be used as the sole basis for treatment or other patient management decisions. A negative result may occur with  improper specimen collection/handling, submission of specimen other than nasopharyngeal swab, presence of viral mutation(s) within the areas targeted by this assay, and inadequate number of viral copies (<131 copies/mL). A negative result must be combined with clinical observations, patient history, and epidemiological information. The expected result is Negative. Fact Sheet for Patients:  PinkCheek.be Fact Sheet for Healthcare Providers:  GravelBags.it This test is not yet ap proved or cleared by the [place] FDA and  has been authorized for detection and/or diagnosis of SARS-CoV-2 by FDA under an Emergency Use Authorization (EUA). This EUA will remain  in effect (meaning this test can be used) for the duration of the COVID-19 declaration under Section 564(b)(1) of the Act, 21 U.S.C. section 360bbb-3(b)(1), unless the authorization is terminated or revoked sooner.    Influenza A by PCR NEGATIVE NEGATIVE Final   Influenza B by PCR NEGATIVE NEGATIVE Final    Comment: (NOTE) The Xpert Xpress SARS-CoV-2/FLU/RSV assay is intended as an aid in   the diagnosis of influenza from Nasopharyngeal swab specimens and  should not be used as a sole basis for treatment. Nasal washings and  aspirates are unacceptable for Xpert Xpress SARS-CoV-2/FLU/RSV  testing. Fact Sheet for Patients: PinkCheek.be Fact Sheet for Healthcare Providers: GravelBags.it This test is not yet approved or cleared by the  [place] Architectural technologist and  has been authorized for detection and/or diagnosis of SARS-CoV-2 by  FDA under an Print production planner (EUA). This EUA will remain  in effect (meaning this test can be used) for the duration of the  Covid-19 declaration under Section 564(b)(1) of the Act, 21  U.S.C. section 360bbb-3(b)(1), unless the authorization is  terminated or revoked. Performed at [facility], [address] [place], [place] [address]   MRSA PCR Screening     Status: None   Collection Time: 07/05/19  2:54 AM   Specimen: Nasal Mucosa; Nasopharyngeal  Result Value Ref Range Status   MRSA by PCR NEGATIVE NEGATIVE Final    Comment:        The GeneXpert MRSA Assay (FDA approved for NASAL specimens only), is one component of a comprehensive MRSA colonization surveillance program. It is not intended to diagnose MRSA infection nor to guide or monitor treatment for MRSA infections. Performed at [facility], [address], [place], [place] [address]   Aerobic Culture (superficial specimen)     Status: Abnormal   Collection Time: 07/06/19  8:32 PM   Specimen: Wound  Result Value Ref Range Status   Specimen Description   Final    WOUND Performed at [facility], [address], [place], [place] [address]    Special Requests   Final    NONE Performed at [facility], [address] [place], [place] [address]    Gram Stain   Final    RARE WBC PRESENT, PREDOMINANTLY PMN RARE GRAM POSITIVE COCCI IN PAIRS Performed at [facility], [place] [address] [place], [place] [address]    Culture MULTIPLE ORGANISMS PRESENT, NONE PREDOMINANT (A)  Final   Report Status 07/10/2019 FINAL  Final  Anaerobic culture     Status: None (Preliminary result)   Collection Time: 07/07/19  4:26 PM   Specimen: PATH Cytology CSF; Cerebrospinal Fluid  Result Value Ref Range Status   Specimen Description   Final    CSF Performed at [facility], [address] [place], [place] [address]    Special Requests   Final    NONE Performed at [facility], [address] [place], [place] [address]    Gram Stain   Final    NO WBC SEEN NO ORGANISMS SEEN CYTOSPIN SMEAR Performed at [facility], [place] [address], [place], [place] [address]    Culture   Final    NO ANAEROBES ISOLATED; CULTURE IN PROGRESS FOR 5 DAYS   Report Status PENDING  Incomplete  CSF culture     Status: None (Preliminary result)   Collection Time: 07/07/19  4:26 PM   Specimen: PATH Cytology CSF; Cerebrospinal Fluid  Result Value Ref Range Status   Specimen Description   Final    CSF Performed at [facility], [address], [place], [place] [address]    Special Requests   Final    NONE Performed at [facility] [address] [place], [place] [address]    Gram Stain   Final    NO ORGANISMS SEEN WBC SEEN RED BLOOD CELLS PRESENT Performed at [facility], [address] [place], [place] [address]    Culture   Final    NO GROWTH 2 DAYS Performed at [facility], [place] [address], [place], [place] [address]    Report Status PENDING  Incomplete  Culture, fungus without smear  Status: None (Preliminary result)   Collection Time: 07/07/19  4:26 PM   Specimen: PATH Cytology CSF; Cerebrospinal Fluid  Result Value Ref Range Status   Specimen Description   Final    CSF Performed at [facility], [address], [place], [place] [address]    Special Requests   Final    NONE Performed at  [facility], [address] [place], [place] [address]    Culture   Final    NO FUNGUS ISOLATED AFTER 3 DAYS Performed at [facility], [place] [address] [place], [place] [address]    Report Status PENDING  Incomplete  CULTURE, BLOOD (ROUTINE X 2) w Reflex to ID Panel     Status: None (Preliminary result)   Collection Time: 07/08/19  5:30 AM   Specimen: BLOOD  Result Value Ref Range Status   Specimen Description BLOOD BLOOD LEFT HAND  Final   Special Requests   Final    BOTTLES DRAWN AEROBIC AND ANAEROBIC Blood Culture adequate volume   Culture   Final    NO GROWTH 3 DAYS Performed at [facility], [address] [place], [place] [address]    Report Status PENDING  Incomplete  Urine Culture     Status: None   Collection Time: 07/08/19  5:46 AM   Specimen: Urine, Random  Result Value Ref Range Status   Specimen Description   Final    URINE, RANDOM Performed at [facility], [address] [place], [place] [address]    Special Requests   Final    NONE Performed at [facility], [address], [place], [place] [address]    Culture   Final    NO GROWTH Performed at [name], [place] [address], [place], [place] [address]    Report Status 07/09/2019 FINAL  Final  CULTURE, BLOOD (ROUTINE X 2) w Reflex to ID Panel     Status: None (Preliminary result)   Collection Time: 07/08/19 11:05 AM   Specimen: BLOOD  Result Value Ref Range Status   Specimen Description BLOOD BLOOD RIGHT HAND  Final   Special Requests   Final    BOTTLES DRAWN AEROBIC AND ANAEROBIC Blood Culture adequate volume   Culture   Final    NO GROWTH 3 DAYS Performed at [facility], [address] [place],  [address]    Report Status PENDING  Incomplete    Coagulation Studies: No results for input(s): LABPROT, INR in the last 72 hours.  Imaging: DG Chest Port 1 View  Result Date: 07/10/2019 CLINICAL DATA:  Intubation check EXAM:  PORTABLE CHEST 1 VIEW COMPARISON:  05/10/2019, 4:49 a.m. FINDINGS: Endotracheal tube is slightly retracted, measuring approximately 6.5 cm above the carina although still below the thoracic inlet. Esophagogastric tube poorly visualized on underpenetrated examination although appears remain below the diaphragm. Unchanged mild, diffuse interstitial opacity. No new airspace opacity. IMPRESSION: Endotracheal tube is slightly retracted, measuring approximately 6.5 cm above the carina although still below the thoracic inlet. Electronically Signed   By: Eddie Candle M.D.   On: 07/10/2019 11:25   DG Chest Port 1 View  Result Date: 07/10/2019 CLINICAL DATA:  Acute respiratory failure EXAM: PORTABLE CHEST 1 VIEW COMPARISON:  Radiograph 07/04/2019 FINDINGS: *Endotracheal tube in the mid trachea, 5 cm from the carina. *Transesophageal tube curling in the left upper quadrant. Nonvisualization of the side port. *Telemetry leads overlie the chest. Persistent cardiomegaly and prominence of the upper mediastinum. Diffuse  interstitial opacity throughout both lungs with a basilar gradient. Progressive elevation the right hemidiaphragm could reflect developing sub pulmonic effusion or right basilar volume loss. No pneumothorax. No acute osseous or soft tissue abnormality. Degenerative changes are present in the imaged spine and shoulders. IMPRESSION: 1. Progressive elevation of the right hemidiaphragm could reflect developing subpulmonic effusion or increasing right basilar volume loss. 2. Diffuse interstitial opacity throughout both lungs with a basilar gradient, could reflect edema or infection. 3. Cardiomegaly. 4. Lines and tubes as above. Electronically Signed   By: Lovena Le M.D.   On: 07/10/2019 06:53    Medications:  I have reviewed the patient's current medications. Scheduled: . chlorhexidine gluconate (MEDLINE KIT)  15 mL Mouth Rinse BID  . Chlorhexidine Gluconate Cloth  6 each Topical Daily  . cloNIDine  0.3 mg  Transdermal Weekly  . enoxaparin (LOVENOX) injection  40 mg Subcutaneous BID  . folic acid  1 mg Intravenous Daily  . HYDROmorphone   Intravenous Q4H  . insulin aspart  0-20 Units Subcutaneous Q4H  . mouth rinse  15 mL Mouth Rinse 10 times per day  . nystatin  5 mL Oral QID  . pantoprazole (PROTONIX) IV  40 mg Intravenous Daily  . sodium chloride flush  10-40 mL Intracatheter Q12H  . thiamine injection  100 mg Intravenous Daily    Assessment/Plan: 54 year old malewith medical history significant forsickle thalassemia, COPD, diabetes and hypertension who presented to the emergency room with a complaint of headache and aching all over.In the ED altered at times and withintermittent shaking of his left arm and leg with deviation of his head to the leftwhile awake. Felt to be partial seizures and started on Keppra. Patient hypothermic and with elevated white blood cell count. Due to possibility of CNS infection patient started on broad spectrum antibiotics to include Acyclovir, Ampicillin and Ceftriaxone.    - MRI reviewed R hemispheric cortical diffision changes can be seen in seizures. Not in usual vascular territory for strokes.  - Clinically he is agitated, delirious - would consider Seroquel 83m BID after checking QTC and a prece dex until able to wean.   - Con't Keppra and Dilantin - his seizures were generalized and currently improved so would hold off EEG today

## 2019-07-11 NOTE — Evaluation (Signed)
Clinical/Bedside Swallow Evaluation Patient Details  Name: John Haas MRN: [id number] Date of Birth: Jul 03, 1965  Today's Date: 07/11/2019 Time: SLP Start Time (ACUTE ONLY): 1500 SLP Stop Time (ACUTE ONLY): 1600 SLP Time Calculation (min) (ACUTE ONLY): 60 min  Past Medical History:  Past Medical History:  Diagnosis Date  . COPD (chronic obstructive pulmonary disease) (HCC)   . Diabetes mellitus without complication (HCC)   . Heart attack (HCC)   . Hypertension   . 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 Sickle cell anemia (HCC)    Past Surgical History:  Past Surgical History:  Procedure Laterality Date  . ANKLE SURGERY    . KNEE SURGERY    . PORT-A-CATH REMOVAL     HPI:  Pt is a 54 y.o. male with medical history significant for sickle thalassemia, COPD, diabetes and hypertension who presented to the emergency room with a complaint of headache and aching all over.  He apparently went to [facility] ED yesterday but left without being seen.  In the ER tonight he became belligerent at times and at times appeared confused.  Also while in the emergency room he was noted to have intermittent shaking of his left arm and leg with deviation of his head to the left but he remained awake and alert during these episodes. Felt to be partial seizures and started on Keppra.  Patient afebrile but with elevated white blood cell count.  On 07/07/2019, noted patient with recurrent partial seizure activity, evaluated at bedside with neurologist, patient is at high risk of trauma to tongue with possible bleeding/aspiration into airway, currently unable to protect airway, decision to intubate emergently.  He was extubated on 07/10/2019.  Pt continues to exhibit delirium, on medications to calm behaviors.    Assessment / Plan / Recommendation Clinical Impression  Pt appears to present w/ adequate oropharyngeal phase swallowing function w/ no overt clinical s/s of  aspiration noted during oral intake at this evaluation. Pt consumed po trials w/ no decline in respiratory status, O2 sats(98%). Vocal quality was clear b/t trials. Pt was often Impulsive in his drinking behavior w/ thin liquids Via CUP(no straw utilized) and needed verbal/tactile cues to slow down/take a break. Pt was agreable to cues given by SLP though mental status remains declined. No overt oral phase deficits noted. Recommend a Mech Soft diet w/ thin liquids; aspiration precautions; Pills in Puree for safer swallowing currently. Monitoring at all meals/po's necessary d/t overt behavior currently. NSG updated.  SLP Visit Diagnosis: Dysphagia, unspecified (R13.10)    Aspiration Risk  Mild aspiration risk;Risk for inadequate nutrition/hydration(reduced following aspiration precautions)    Diet Recommendation  Mech Soft diet w/ thin liquids VIA CUP - NO Straws. Aspiration precautions d/t mental status decline; monitoring at all meals d/t impulsive behavior and need to reduce such during oral intake for safety  Medication Administration: Whole meds with puree(for safer swallowing)    Other  Recommendations Recommended Consults: (Dietician f/u) Oral Care Recommendations: Oral care BID;Oral care before and after PO;Staff/trained caregiver to provide oral care Other Recommendations: (n/a)   Follow up Recommendations None(TBD)      Frequency and Duration min 2x/week  1 week       Prognosis Prognosis for Safe Diet Advancement: Good Barriers to Reach Goals: Behavior;Severity of deficits      Swallow Study   General Date of Onset: 07/03/19 HPI: Pt is a 55 y.o. male with medical history significant for sickle thalassemia, COPD,  diabetes and hypertension who presented to the emergency room with a complaint of headache and aching all over.  He apparently went to [facility] ED yesterday but left without being seen.  In the ER tonight he became belligerent at times and at times appeared confused.  Also  while in the emergency room he was noted to have intermittent shaking of his left arm and leg with deviation of his head to the left but he remained awake and alert during these episodes. Felt to be partial seizures and started on Keppra.  Patient afebrile but with elevated white blood cell count.  On 07/07/2019, noted patient with recurrent partial seizure activity, evaluated at bedside with neurologist, patient is at high risk of trauma to tongue with possible bleeding/aspiration into airway, currently unable to protect airway, decision to intubate emergently.  He was extubated on 07/10/2019.  Pt continues to exhibit delirium, on medications to calm behaviors.  Type of Study: Bedside Swallow Evaluation Previous Swallow Assessment: none Diet Prior to this Study: NPO(regular diet) Temperature Spikes Noted: No(wbc 22.9) Respiratory Status: Nasal cannula(2L) History of Recent Intubation: Yes Length of Intubations (days): 4 days Date extubated: 07/10/19 Behavior/Cognition: Alert;Cooperative;Pleasant mood;Impulsive;Distractible;Requires cueing Oral Cavity Assessment: Within Functional Limits(grossly) Oral Care Completed by SLP: Yes Oral Cavity - Dentition: Adequate natural dentition Vision: Functional for self-feeding Self-Feeding Abilities: Able to feed self;Needs assist;Needs set up;Total assist(monitoring) Patient Positioning: Upright in bed(needed full positioning) Baseline Vocal Quality: Normal Volitional Cough: Strong Volitional Swallow: Able to elicit    Oral/Motor/Sensory Function Overall Oral Motor/Sensory Function: Within functional limits   Ice Chips Ice chips: Within functional limits Presentation: Spoon(fed; 3 trials)   Thin Liquid Thin Liquid: Within functional limits Presentation: Cup;Self Fed(supported; ~8ozs+)    Nectar Thick Nectar Thick Liquid: Not tested   Honey Thick Honey Thick Liquid: Not tested   Puree Puree: Within functional limits Presentation: Spoon(fed; ~3 ozs)    Solid     Solid: Within functional limits Presentation: Spoon(fed; 5 trials)        Orinda Kenner, MS, CCC-SLP Watson,Katherine 07/11/2019,6:13 PM

## 2019-07-11 NOTE — Progress Notes (Signed)
Pt was completely unresponsive in the beginning of shift, stopped drips. Pt slowly became more responsive throughout shift. He currently is AOx4 remains delirious and irate at times. Pt family has been present throughout shift, very supportive and calm. Diet started heart healthy, carb modified. Using urinal. No BM. Currently family in room and pt remaining stable.

## 2019-07-11 NOTE — Progress Notes (Addendum)
Shift summary:   - Unresponsive and hypothermic upon AM rounds. Precedex infusion and Dilaudid PCA stopped. MD notified.  - Family meeting today with patient's sister and brother.  - More responsive this afternoon, Dilaudid PCA resumed at lower dose per pharmacy.   - SLP consult today.

## 2019-07-11 NOTE — Progress Notes (Signed)
Remains agitated intermittently with shouting but responds to questions  Patient Vitals for the past 24 hrs:  BP Temp Temp src Pulse Resp SpO2  07/11/19 1800 (!) 135/115 -- -- -- 15 99 %  07/11/19 1700 (!) 159/45 -- -- -- 17 --  07/11/19 1600 (!) 135/102 98.6 F (37 C) Oral -- 16 97 %  07/11/19 1500 105/86 -- -- -- 16 97 %  07/11/19 1400 110/90 98.5 F (36.9 C) Oral (!) 103 15 97 %  07/11/19 1300 121/70 -- -- -- 16 96 %  07/11/19 1200 112/70 -- -- -- 12 --  07/11/19 1152 (!) 111/57 97.6 F (36.4 C) Oral 82 -- 94 %  07/11/19 1100 (!) 111/57 -- -- 77 (!) 22 98 %  07/11/19 1057 113/81 (!) 97.5 F (36.4 C) Oral 68 14 97 %  07/11/19 1000 111/80 -- -- 70 13 93 %  07/11/19 0959 121/87 (!) 93.4 F (34.1 C) Oral -- -- --  07/11/19 0900 (!) 121/91 -- -- 74 19 93 %  07/11/19 0855 103/83 (!) 92.1 F (33.4 C) Axillary 65 14 97 %  07/11/19 0800 119/79 -- -- (!) 50 12 96 %  07/11/19 0730 99/76 (!) 92.4 F (33.6 C) Axillary (!) 51 11 97 %  07/11/19 0700 126/82 -- -- (!) 53 11 97 %  07/11/19 0600 104/86 -- -- (!) 53 12 94 %  07/11/19 0500 (!) 121/91 -- -- (!) 55 13 94 %  07/11/19 0445 -- -- -- -- 14 95 %  07/11/19 0400 (!) 128/92 97.8 F (36.6 C) Axillary (!) 58 (!) 114 95 %  07/11/19 0300 (!) 142/83 -- -- (!) 59 10 96 %  07/11/19 0200 128/84 -- -- (!) 58 12 95 %  07/11/19 0100 137/88 -- -- (!) 59 12 92 %  07/11/19 0022 -- -- -- -- 12 95 %  07/11/19 0000 127/78 98 F (36.7 C) Axillary 62 12 95 %  07/10/19 2300 129/81 -- -- 62 13 93 %  07/10/19 2200 (!) 140/91 -- -- 65 14 95 %  07/10/19 2100 140/82 -- -- 69 11 95 %  07/10/19 2022 -- -- -- -- 20 97 %  07/10/19 2000 (!) 176/99 97.7 F (36.5 C) Axillary 72 12 93 %   Partial seizures , dysconjugate gaze, temporo/parietal, occipital involvement on EEG with severe headachert side   D.D is varied - toxidrome,   PRES, sickle Beta + thalassemia . infection proving to be less likely  initially thought to be cerebritis- but repeat MRI done   with contrast does not show cerebritis  No cerebral abscess HSV DNA neg LP shows only 7 cells and high protein and high glucose ( a dissociation between cells- protein HSV DNA neg and hence acyclovir was DC on Saturday. Ampicillin was DC Sunday Currently on ceftriaxone - which will be reduced to once a day dose  Anemia- s/o PRBC  Rt foot wound- chronic - saw his podiatrist on 4/16 and being treated as a superficial wound and no osteo- I saw his wound and it does not look overtly infected- this is not the cause of his presentation and he has no bacteremia.    Discussed the management with care team

## 2019-07-11 NOTE — Plan of Care (Signed)
  Problem: Health Behavior/Discharge Planning: Goal: Ability to manage health-related needs will improve Outcome: Not Progressing   Problem: Education: Goal: Knowledge of General Education information will improve Description: Including pain rating scale, medication(s)/side effects and non-pharmacologic comfort measures Outcome: Not Progressing   Problem: Clinical Measurements: Goal: Ability to maintain clinical measurements within normal limits will improve Outcome: Not Progressing Goal: Will remain free from infection Outcome: Not Progressing Goal: Diagnostic test results will improve Outcome: Not Progressing Goal: Respiratory complications will improve Outcome: Not Progressing Goal: Cardiovascular complication will be avoided Outcome: Not Progressing   Problem: Activity: Goal: Risk for activity intolerance will decrease Outcome: Not Progressing   Problem: Nutrition: Goal: Adequate nutrition will be maintained Outcome: Not Progressing   Problem: Coping: Goal: Level of anxiety will decrease Outcome: Not Progressing   Problem: Elimination: Goal: Will not experience complications related to bowel motility Outcome: Not Progressing Goal: Will not experience complications related to urinary retention Outcome: Not Progressing   Problem: Pain Managment: Goal: General experience of comfort will improve Outcome: Not Progressing   Problem: Safety: Goal: Ability to remain free from injury will improve Outcome: Not Progressing   Problem: Skin Integrity: Goal: Risk for impaired skin integrity will decrease Outcome: Not Progressing   

## 2019-07-11 NOTE — Progress Notes (Signed)
CRITICAL CARE PROGRESS NOTE    Name: Osmin Welz MRN: [id number] DOB: 1965-09-11     LOS: 6   SUBJECTIVE FINDINGS & SIGNIFICANT EVENTS   Patient description:   Javid Kemler is a 54 y.o. male with medical history significant for sickle thalassemia, COPD, diabetes and hypertension who presented to the emergency room with a complaint of headache and aching all over.  He apparently went to [facility] ED yesterday but left without being seen.  In the ER tonight he became belligerent at times and at times appeared confused.  Also while in the emergency room he was noted to have intermittent shaking of his left arm and leg with deviation of his head to the left but he remained awake. Patient had an extensive work-up in the emergency room that was significant for white cell count of 22,500 up from his baseline of 17,000 and he had mild hyponatremia of 129.  Creatinine was 1.48 and blood sugar 348.  Flu and Covid test were negative.  He also had extensive imaging with CT venogram of the head, CT angio head and neck with and without contrast that showed no acute findings.  The emergency room provider spoke with hematology at [facility] who did not think the findings had to do with his sickle thalassemia.  He also spoke with neurology at [facility] health who recommended giving [place] for suspected partial seizures.  Patient received Keppra and Ativan in the emergency room.  He was also treated empirically for possible meningitis given his continued complaints of headache and slight worsening of his chronic leukocytosis.   Lines / Drains: Internal jugular central line, PIV x2  Cultures / Sepsis markers: Status post lumbar puncture, blood cultures  Antibiotics: Acyclovir, ampicillin, Rocephin,   Protocols / Consultants: PCCM, neurology,  hematology/oncology, infectious disease, hospitalist service   Events: 07/07/19-patient with recurrent partial seizure activity, evaluated at bedside with neurologist, patient is at high risk of trauma to tongue with possible bleeding/aspiration into airway, currently unable to protect airway, decision to intubate emergently.  Post intubation was able to speak with his sister and discuss care plan.  She is thankful for care, questions were answered sister is agreeable with care plan.  A lumbar puncture was able to be performed after endotracheal intubation.  Patient was placed on propofol and seizure activity has resolved. 07/08/19 -no seizure activity overnight. 07/09/19- plan to wean sedation with trial to liberate from MV today.  5/3 remains intubated,sedated, severe delerium 5/3 extubated 5/4 severe delirium, on dilaudid and precedex infusion High risk for re-intubation 5/3 extrub    MEDICATIONS   Current Medication:  Current Facility-Administered Medications:  .  acetaminophen (TYLENOL) tablet 650 mg, 650 mg, Oral, Q6H PRN, 650 mg at 07/05/19 1940 **OR** acetaminophen (TYLENOL) suppository 650 mg, 650 mg, Rectal, Q6H PRN, Judd Gaudier V, MD .  cefTRIAXone (ROCEPHIN) 2 g in sodium chloride 0.9 % 100 mL IVPB, 2 g, Intravenous, Q12H, Tsosie Billing, MD, Stopped at 07/11/19 0010 .  chlorhexidine gluconate (MEDLINE KIT) (PERIDEX) 0.12 % solution 15 mL, 15 mL, Mouth Rinse, BID, Lanney Gins, Fuad, MD, 15 mL at 07/10/19 0735 .  Chlorhexidine Gluconate Cloth 2 % PADS 6 each, 6 each, Topical, Daily, Athena Masse, MD, 6 each at 07/10/19 2321 .  cloNIDine (CATAPRES - Dosed in mg/24 hr) patch 0.3 mg, 0.3 mg, Transdermal, Weekly, Shayn Madole, MD, 0.3 mg at 07/10/19 1701 .  dexamethasone (DECADRON) injection 10 mg, 10 mg, Intravenous, Daily, Sharmon Cheramie, MD .  dexmedetomidine (PRECEDEX) 400  MCG/100ML (4 mcg/mL) infusion, 0.4-1.2 mcg/kg/hr, Intravenous, Titrated, Kaylub Detienne, MD, Last Rate:  30.8 mL/hr at 07/11/19 0551, 1.2 mcg/kg/hr at 07/11/19 0551 .  diphenhydrAMINE (BENADRYL) injection 12.5 mg, 12.5 mg, Intravenous, Q6H PRN, 12.5 mg at 07/10/19 1659 **OR** diphenhydrAMINE (BENADRYL) 12.5 MG/5ML elixir 12.5 mg, 12.5 mg, Oral, Q6H PRN, Mortimer Fries, Audri Kozub, MD .  folic acid (FOLVITE) tablet 1 mg, 1 mg, Oral, Daily, Britainy Kozub, MD .  hydrALAZINE (APRESOLINE) injection 10-20 mg, 10-20 mg, Intravenous, Q6H PRN, Awilda Bill, NP, 20 mg at 07/10/19 2017 .  HYDROmorphone (DILAUDID) 1 mg/mL PCA injection, , Intravenous, Q4H, Wane Mollett, MD .  insulin aspart (novoLOG) injection 0-20 Units, 0-20 Units, Subcutaneous, Q4H, Darel Hong D, NP, 4 Units at 07/11/19 0453 .  levETIRAcetam (KEPPRA) IVPB 1000 mg/100 mL premix, 1,000 mg, Intravenous, Q12H, Alexis Goodell, MD, Stopped at 07/10/19 2050 .  MEDLINE mouth rinse, 15 mL, Mouth Rinse, 10 times per day, Ottie Glazier, MD, 15 mL at 07/11/19 0600 .  naloxone (NARCAN) injection 0.4 mg, 0.4 mg, Intravenous, PRN **AND** sodium chloride flush (NS) 0.9 % injection 9 mL, 9 mL, Intravenous, PRN, Mortimer Fries, Barnett Elzey, MD .  nystatin (MYCOSTATIN) 100000 UNIT/ML suspension 500,000 Units, 5 mL, Oral, QID, Darel Hong D, NP, 500,000 Units at 07/10/19 2256 .  ondansetron (ZOFRAN) injection 4 mg, 4 mg, Intravenous, Q6H PRN, Mortimer Fries, Ladarrian Asencio, MD .  pantoprazole (PROTONIX) injection 40 mg, 40 mg, Intravenous, Daily, Lorrinda Ramstad, MD .  phenytoin (DILANTIN) 150 mg in sodium chloride 0.9 % 100 mL IVPB, 150 mg, Intravenous, Q8H, Alexis Goodell, MD, Last Rate: 206 mL/hr at 07/11/19 0521, 150 mg at 07/11/19 0521 .  sodium chloride flush (NS) 0.9 % injection 10-40 mL, 10-40 mL, Intracatheter, Q12H, Darel Hong D, NP, 10 mL at 07/10/19 2200 .  thiamine tablet 100 mg, 100 mg, Oral, Daily, Mortimer Fries, Jacquise Rarick, MD  REVIEW OF SYSTEMS  PATIENT IS UNABLE TO PROVIDE COMPLETE REVIEW OF SYSTEM S DUE TO SEVERE CRITICAL ILLNESS AND ENCEPHALOPATHY   PHYSICAL EXAMINATION   Vital  Signs: Temp:  [97.1 F (36.2 C)-99.2 F (37.3 C)] 97.8 F (36.6 C) (05/04 0400) Pulse Rate:  [51-101] 51 (05/04 0730) Resp:  [10-114] 11 (05/04 0730) BP: (99-176)/(76-108) 99/76 (05/04 0730) SpO2:  [85 %-100 %] 97 % (05/04 0730) FiO2 (%):  [0 %-40 %] 0 % (05/04 0445)  PHYSICAL EXAMINATION:  GENERAL:critically ill appearing, HEAD: Normocephalic, atraumatic.  EYES: Pupils equal, round, reactive to light.  No scleral icterus.  MOUTH: Moist mucosal membrane. NECK: Supple. No thyromegaly. No nodules. No JVD.  PULMONARY: +rhonchi, +wheezing CARDIOVASCULAR: S1 and S2. Regular rate and rhythm. No murmurs, rubs, or gallops.  GASTROINTESTINAL: Soft, nontender, -distended. Positive bowel sounds.  MUSCULOSKELETAL: No swelling, clubbing, or edema.  NEUROLOGIC: obtunded SKIN:intact,warm,dry      PERTINENT DATA     Infusions: . cefTRIAXone (ROCEPHIN)  IV Stopped (07/11/19 0010)  . dexmedetomidine (PRECEDEX) IV infusion 1.2 mcg/kg/hr (07/11/19 0551)  . levETIRAcetam Stopped (07/10/19 2050)  . phenytoin (DILANTIN) IV 150 mg (07/11/19 0521)   Scheduled Medications: . chlorhexidine gluconate (MEDLINE KIT)  15 mL Mouth Rinse BID  . Chlorhexidine Gluconate Cloth  6 each Topical Daily  . cloNIDine  0.3 mg Transdermal Weekly  . dexamethasone (DECADRON) injection  10 mg Intravenous Daily  . folic acid  1 mg Oral Daily  . HYDROmorphone   Intravenous Q4H  . insulin aspart  0-20 Units Subcutaneous Q4H  . mouth rinse  15 mL Mouth Rinse 10 times per day  .  nystatin  5 mL Oral QID  . pantoprazole (PROTONIX) IV  40 mg Intravenous Daily  . sodium chloride flush  10-40 mL Intracatheter Q12H  . thiamine  100 mg Oral Daily   PRN Medications: acetaminophen **OR** acetaminophen, diphenhydrAMINE **OR** diphenhydrAMINE, hydrALAZINE, naloxone **AND** sodium chloride flush, ondansetron (ZOFRAN) IV Hemodynamic parameters:   Intake/Output: 05/03 0701 - 05/04 0700 In: 2287.6 [I.V.:550.7; NG/GT:1200;  IV Piggyback:536.9] Out: 3200 [Urine:2800; Emesis/NG output:400]  Ventilator  Settings: Vent Mode: PSV FiO2 (%):  [0 %-40 %] 0 % Set Rate:  [16 bmp] 16 bmp Vt Set:  [500 mL] 500 mL PEEP:  [5 cmH20] 5 cmH20 Pressure Support:  [5 cmH20-8 cmH20] 5 cmH20    LAB RESULTS:  Basic Metabolic Panel: Recent Labs  Lab 07/07/19 0237 07/07/19 0237 07/08/19 0600 07/08/19 0600 07/09/19 0344 07/09/19 0344 07/10/19 0338 07/11/19 0424  NA 145  --  150*  --  149*  --  146* 147*  K 4.2   < > 3.4*   < > 4.3   < > 4.1 3.6  CL 114*  --  119*  --  119*  --  115* 113*  CO2 23  --  26  --  25  --  26 28  GLUCOSE 297*  --  160*  --  224*  --  199* 191*  BUN 21*  --  21*  --  17  --  16 18  CREATININE 1.02  --  1.08  --  1.25*  --  0.98 0.85  CALCIUM 8.5*  --  8.3*  --  8.0*  --  8.1* 8.4*  MG  --   --  2.5*  --   --   --  1.9 2.1  PHOS  --   --  2.3*  --   --   --  3.5 2.6   < > = values in this interval not displayed.   Liver Function Tests: Recent Labs  Lab 07/04/19 1832 07/07/19 0237 07/08/19 1139  AST 27 12* 12*  ALT _0 ALKPHOS 116 80 71  BILITOT 1.6* 1.2 0.9  PROT 8.1 6.6 5.5*  ALBUMIN 3.9 2.9* 2.6*   No results for input(s): LIPASE, AMYLASE in the last 168 hours. No results for input(s): AMMONIA in the last 168 hours. CBC: Recent Labs  Lab 07/04/19 1832 07/05/19 0743 07/07/19 0237 07/07/19 0237 07/08/19 0345 07/08/19 2250 07/09/19 0344 07/10/19 0338 07/11/19 0424  WBC 22.5*   < > 19.0*  --  19.2*  --  18.6* 19.6* 22.9*  NEUTROABS 17.0*  --   --   --   --   --   --   --  14.2*  HGB 11.6*   < > 10.7*   < > 7.9* 9.7* 10.7* 10.1* 11.0*  HCT 35.0*   < > 32.5*   < > 24.7* 30.6* 33.5* 31.7* 34.1*  MCV 65.8*   < > 66.2*  --  67.5*  --  69.5* 69.4* 69.3*  PLT 352   < > 283  --  181  --  200 173 149*   < > = values in this interval not displayed.   Cardiac Enzymes: No results for input(s): CKTOTAL, CKMB, CKMBINDEX, TROPONINI in the last 168 hours. BNP: Invalid  input(s): POCBNP CBG: Recent Labs  Lab 07/10/19 1542 07/10/19 2011 07/11/19 0010 07/11/19 0438 07/11/19 0725  GLUCAP 231* 194* 192* 169* 134*       IMAGING RESULTS:  Imaging: DG Chest  Port 1 View  Result Date: 07/10/2019 CLINICAL DATA:  Intubation check EXAM: PORTABLE CHEST 1 VIEW COMPARISON:  05/10/2019, 4:49 a.m. FINDINGS: Endotracheal tube is slightly retracted, measuring approximately 6.5 cm above the carina although still below the thoracic inlet. Esophagogastric tube poorly visualized on underpenetrated examination although appears remain below the diaphragm. Unchanged mild, diffuse interstitial opacity. No new airspace opacity. IMPRESSION: Endotracheal tube is slightly retracted, measuring approximately 6.5 cm above the carina although still below the thoracic inlet. Electronically Signed   By: Eddie Candle M.D.   On: 07/10/2019 11:25   DG Chest Port 1 View  Result Date: 07/10/2019 CLINICAL DATA:  Acute respiratory failure EXAM: PORTABLE CHEST 1 VIEW COMPARISON:  Radiograph 07/04/2019 FINDINGS: *Endotracheal tube in the mid trachea, 5 cm from the carina. *Transesophageal tube curling in the left upper quadrant. Nonvisualization of the side port. *Telemetry leads overlie the chest. Persistent cardiomegaly and prominence of the upper mediastinum. Diffuse interstitial opacity throughout both lungs with a basilar gradient. Progressive elevation the right hemidiaphragm could reflect developing sub pulmonic effusion or right basilar volume loss. No pneumothorax. No acute osseous or soft tissue abnormality. Degenerative changes are present in the imaged spine and shoulders. IMPRESSION: 1. Progressive elevation of the right hemidiaphragm could reflect developing subpulmonic effusion or increasing right basilar volume loss. 2. Diffuse interstitial opacity throughout both lungs with a basilar gradient, could reflect edema or infection. 3. Cardiomegaly. 4. Lines and tubes as above. Electronically  Signed   By: Lovena Le M.D.   On: 07/10/2019 06:53   _0 @ DG Chest Port 1 View  Result Date: 07/10/2019 CLINICAL DATA:  Intubation check EXAM: PORTABLE CHEST 1 VIEW COMPARISON:  05/10/2019, 4:49 a.m. FINDINGS: Endotracheal tube is slightly retracted, measuring approximately 6.5 cm above the carina although still below the thoracic inlet. Esophagogastric tube poorly visualized on underpenetrated examination although appears remain below the diaphragm. Unchanged mild, diffuse interstitial opacity. No new airspace opacity. IMPRESSION: Endotracheal tube is slightly retracted, measuring approximately 6.5 cm above the carina although still below the thoracic inlet. Electronically Signed   By: Eddie Candle M.D.   On: 07/10/2019 11:25     ASSESSMENT AND PLAN   54 yo AAM with SCD admitted fro severe agitation and delerium +intermittent shaking of his left arm and leg with deviation of his head to the leftwhile awake. Felt to be partial seizures and started on Keppra.Patient hypothermic and with elevated white blood cell count. Due to possibility of CNS infection patient started on broad spectrum antibiotics to include Acyclovir, Ampicillin and Ceftriaxone. Patient progressed to severe encephalopathy and severe resp failure with inability to protect airway, extubated however, high risk for aspiration pneumonia and re-intubation  Severe ACUTE Hypoxic and Hypercapnic Respiratory Failure On oxygen High risk for aspiration High risk fro re-intubation    NEUROLOGY +CNS infection +SEZIURES +PRES +polysubstance abuse Very poor prognosis   SICKLE CELL DISEASE Follow up hem recs  RT FOOT ULCER INFECTIOUS DISEASE -continue antibiotics as prescribed -follow up cultures -follow up ID consultation   NPO  ELECTROLYTES -follow labs as needed -replace as needed -pharmacy consultation and following    DVT/GI PRX ordered TRANSFUSIONS AS NEEDED MONITOR FSBS ASSESS the need for LABS  as needed    Critical Care Time devoted to patient care services described in this note is 32 minutes.   Overall, patient is critically ill, prognosis is guarded.   Corrin Parker, M.D.  [facility]  Medical Director [place] Director [facility]  Cardio-Pulmonary Department

## 2019-07-12 DIAGNOSIS — R410 Disorientation, unspecified: Secondary | ICD-10-CM | POA: Diagnosis not present

## 2019-07-12 DIAGNOSIS — G40909 Epilepsy, unspecified, not intractable, without status epilepticus: Secondary | ICD-10-CM | POA: Diagnosis not present

## 2019-07-12 DIAGNOSIS — G039 Meningitis, unspecified: Secondary | ICD-10-CM | POA: Diagnosis not present

## 2019-07-12 LAB — CBC WITH DIFFERENTIAL/PLATELET
Abs Immature Granulocytes: 0.14 10*3/uL — ABNORMAL HIGH (ref 0.00–0.07)
Basophils Absolute: 0 10*3/uL (ref 0.0–0.1)
Basophils Relative: 0 %
Eosinophils Absolute: 0.1 10*3/uL (ref 0.0–0.5)
Eosinophils Relative: 1 %
HCT: 31.8 % — ABNORMAL LOW (ref 39.0–52.0)
Hemoglobin: 10.2 g/dL — ABNORMAL LOW (ref 13.0–17.0)
Immature Granulocytes: 1 %
Lymphocytes Relative: 28 %
Lymphs Abs: 5.9 10*3/uL — ABNORMAL HIGH (ref 0.7–4.0)
MCH: 22.4 pg — ABNORMAL LOW (ref 26.0–34.0)
MCHC: 32.1 g/dL (ref 30.0–36.0)
MCV: 69.7 fL — ABNORMAL LOW (ref 80.0–100.0)
Monocytes Absolute: 2.9 10*3/uL — ABNORMAL HIGH (ref 0.1–1.0)
Monocytes Relative: 14 %
Neutro Abs: 11.8 10*3/uL — ABNORMAL HIGH (ref 1.7–7.7)
Neutrophils Relative %: 56 %
Platelets: 143 10*3/uL — ABNORMAL LOW (ref 150–400)
RBC: 4.56 MIL/uL (ref 4.22–5.81)
RDW: 19.5 % — ABNORMAL HIGH (ref 11.5–15.5)
Smear Review: NORMAL
WBC: 20.9 10*3/uL — ABNORMAL HIGH (ref 4.0–10.5)
nRBC: 3.8 % — ABNORMAL HIGH (ref 0.0–0.2)

## 2019-07-12 LAB — BASIC METABOLIC PANEL
Anion gap: 6 (ref 5–15)
BUN: 16 mg/dL (ref 6–20)
CO2: 28 mmol/L (ref 22–32)
Calcium: 8 mg/dL — ABNORMAL LOW (ref 8.9–10.3)
Chloride: 113 mmol/L — ABNORMAL HIGH (ref 98–111)
Creatinine, Ser: 1 mg/dL (ref 0.61–1.24)
GFR calc Af Amer: >60 mL/min (ref >60)
GFR calc non Af Amer: >60 mL/min (ref >60)
Glucose, Bld: 163 mg/dL — ABNORMAL HIGH (ref 70–99)
Potassium: 3.6 mmol/L (ref 3.5–5.1)
Sodium: 147 mmol/L — ABNORMAL HIGH (ref 135–145)

## 2019-07-12 LAB — GLUCOSE, CAPILLARY
Glucose-Capillary: 126 mg/dL — ABNORMAL HIGH (ref 70–99)
Glucose-Capillary: 128 mg/dL — ABNORMAL HIGH (ref 70–99)
Glucose-Capillary: 142 mg/dL — ABNORMAL HIGH (ref 70–99)
Glucose-Capillary: 145 mg/dL — ABNORMAL HIGH (ref 70–99)
Glucose-Capillary: 188 mg/dL — ABNORMAL HIGH (ref 70–99)
Glucose-Capillary: 209 mg/dL — ABNORMAL HIGH (ref 70–99)

## 2019-07-12 LAB — CSF CULTURE W GRAM STAIN
Culture: NO GROWTH
Gram Stain: NONE SEEN

## 2019-07-12 LAB — MAGNESIUM: Magnesium: 1.7 mg/dL (ref 1.7–2.4)

## 2019-07-12 LAB — PHOSPHORUS: Phosphorus: 2.6 mg/dL (ref 2.5–4.6)

## 2019-07-12 MED ORDER — QUETIAPINE FUMARATE 100 MG PO TABS
100.0000 mg | ORAL_TABLET | Freq: Every day | ORAL | Status: DC
  Administered 2019-07-12: 22:00:00 100 mg via ORAL
  Filled 2019-07-12: qty 1

## 2019-07-12 MED ORDER — ENOXAPARIN SODIUM 40 MG/0.4ML ~~LOC~~ SOLN
40.0000 mg | SUBCUTANEOUS | Status: DC
  Administered 2019-07-13: 40 mg via SUBCUTANEOUS
  Filled 2019-07-12: qty 0.4

## 2019-07-12 MED ORDER — SODIUM CHLORIDE 0.9 % IV SOLN: 2.0000 g | INTRAVENOUS | Status: DC

## 2019-07-12 MED ORDER — ENSURE ENLIVE PO LIQD: 237.0000 mL | Freq: Three times a day (TID) | ORAL | Status: DC

## 2019-07-12 MED ORDER — PANTOPRAZOLE SODIUM 40 MG PO TBEC
40.0000 mg | DELAYED_RELEASE_TABLET | Freq: Every day | ORAL | Status: DC
  Administered 2019-07-13: 40 mg via ORAL
  Filled 2019-07-12: qty 1

## 2019-07-12 MED ORDER — THIAMINE HCL 100 MG PO TABS
100.0000 mg | ORAL_TABLET | Freq: Every day | ORAL | Status: DC
  Administered 2019-07-13: 100 mg via ORAL
  Filled 2019-07-12: qty 1

## 2019-07-12 MED ORDER — MAGNESIUM SULFATE 2 GM/50ML IV SOLN
2.0000 g | Freq: Once | INTRAVENOUS | Status: AC
  Administered 2019-07-12: 2 g via INTRAVENOUS
  Filled 2019-07-12: qty 50

## 2019-07-12 MED ORDER — ENOXAPARIN SODIUM 40 MG/0.4ML ~~LOC~~ SOLN: 40.0000 mg | SUBCUTANEOUS | Status: DC

## 2019-07-12 MED ORDER — QUETIAPINE FUMARATE 25 MG PO TABS
50.0000 mg | ORAL_TABLET | Freq: Once | ORAL | Status: AC
  Administered 2019-07-12: 50 mg via ORAL
  Filled 2019-07-12: qty 2

## 2019-07-12 MED ORDER — DEXMEDETOMIDINE HCL IN NACL 400 MCG/100ML IV SOLN
0.4000 ug/kg/h | INTRAVENOUS | Status: DC
  Administered 2019-07-12: 0.8 ug/kg/h via INTRAVENOUS
  Administered 2019-07-12: 0.5 ug/kg/h via INTRAVENOUS
  Administered 2019-07-12: 1.2 ug/kg/h via INTRAVENOUS
  Filled 2019-07-12 (×3): qty 100

## 2019-07-12 MED ORDER — ADULT MULTIVITAMIN W/MINERALS CH
1.0000 | ORAL_TABLET | Freq: Every day | ORAL | Status: DC
  Administered 2019-07-13: 1 via ORAL
  Filled 2019-07-12: qty 1

## 2019-07-12 MED ORDER — FOLIC ACID 1 MG PO TABS
1.0000 mg | ORAL_TABLET | Freq: Every day | ORAL | Status: DC
  Administered 2019-07-13: 1 mg via ORAL
  Filled 2019-07-12: qty 1

## 2019-07-12 MED ORDER — PROMETHAZINE HCL 25 MG/ML IJ SOLN
6.2500 mg | Freq: Four times a day (QID) | INTRAMUSCULAR | Status: DC | PRN
  Administered 2019-07-12 – 2019-07-13 (×2): 6.25 mg via INTRAVENOUS
  Filled 2019-07-12 (×3): qty 1

## 2019-07-12 NOTE — TOC Initial Note (Addendum)
Transition of Care [facility]) - Initial/Assessment Note    Patient Details  Name: John Haas MRN: [id number] Date of Birth: 27-Nov-1965  Transition of Care [facility]) CM/SW Contact:    Magnus Ivan, LCSW Phone Number: 07/12/2019, 11:45 AM  Clinical Narrative:          Per rounds, consider options of SNF or LTAC. PT/OT consults needed if SNF.   CSW met with patient's brother, John Haas, about possible options for patient. Discussed LTAC and SNF. Explained PT/OT would have to recommend SNF and patient may be difficult to place due to his medical complexity, agitation and behaviors per progression rounds and chart review. John Haas is agreeable to CSW reaching out to local LTACs about if patient would be appropriate for LTAC. John Haas is going to relay updates to his sisters. CSW sent John Haas list of LTAC options from [employer]. He denied having a preference at this time.  CSW reached out to [place] and [place] to review charts and see if they would be able to accommodate patient.          1:00- [facility] Doroteo Bradford reported patient does not appear to meet criteria but would if he was intubated. Erika asked when patient's antibiotics will stop. She reported it appears to be withdrawal symptoms vs ongoing medical issues. CSW updated Dr. Mortimer Fries. Told Erika that per Dr. Mortimer Fries, unsure about antibiotic length and patient is being followed by ID. Doroteo Bradford reported she spoke with her Leadership and they cannot accept patient at this time.  1:45- Update from [facility] that they are able to accept patient and could offer a bed for today or tomorrow. CSW updated Dr. Mortimer Fries. Attempted call to brother, John Haas and sister, John Haas. Left a voicemail for both.   1:50- Received a return call from [place]. Provided update regarding the above. He was agreeable to CSW having [facility] information officer reach out to him to address  details of LTAC and answer questions. He also asked if he could talk to Dr. Mortimer Fries when he comes back to the hospital around 4:30, CSW updated MD.  2:50- Called [facility] information officer who reported she spoke with John Haas earlier and he and patient's sisters plan to come tour the facility.  Called John Haas who says he has not had a chance to call his sisters yet, but would like to tour [facility] in the morning with his sisters before patient transferring there. He asked if patient could discharge there tomorrow. CSW updated Dr. Mortimer Fries and [facility] Rep Ander Purpura.  3:10- Received a return call from patient's sister, John Haas. Provided update to [place]. She reported she will talk with John Haas and their other sister and tour [facility] tomorrow morning then let CSW know. She was understanding that patient would be medically ready to discharge per MD.       Barriers to Discharge: Continued Medical Work up   Patient Goals and CMS Choice   CMS Medicare.gov Compare Post Acute Care list provided to:: Patient Represenative (must comment) Choice offered to / list presented to : Sibling  Expected Discharge Plan and Services         Living arrangements for the past 2 months: Single Family Home                                      Prior Living Arrangements/Services Living arrangements for the past 2 months: [place]  Lives with:: Self          Need for Family Participation in Patient Care: Yes (Comment) Care giver support system in place?: Yes (comment)   Criminal Activity/Legal Involvement Pertinent to Current Situation/Hospitalization: No - Comment as needed  Activities of Daily Living      Permission Sought/Granted Permission sought to share information with : Investment banker, corporate granted to share info w AGENCY: LTACs, SNFs        Emotional Assessment       Orientation: : Fluctuating Orientation (Suspected and/or reported  [place]) Alcohol / Substance Use: Illicit Drugs Psych Involvement: No (comment)  Admission diagnosis:  Chronic pain syndrome [G89.4] Severe headache [R51.9] Recurrent seizures ([place]) [G40.909] New onset seizure ([place]) [R56.9] Other elevated white blood cell (WBC) count [D72.828] Patient Active Problem List   Diagnosis Date Noted  . Encephalopathy   . Sickle thalassemia disease ([place]) 07/05/2019  . Partial seizure ([place]) 07/05/2019  . Headache 07/05/2019  . COPD with acute bronchitis ([place]) 07/05/2019  . Type 2 diabetes mellitus without complication ([place]) 16/12/9602  . Suspect Meningitis 07/05/2019  . Acute confusion 07/05/2019  . Recurrent seizures ([place]) 07/05/2019   PCP:  System, Pcp Not In Pharmacy:   [facility] [address] [place] [address] Phone: [phone] Fax: [phone]  [id number] [place] [id number]: [phone]: [phone] [id number] [place] [place] [id number]: [phone]: [phone]   Social Determinants of Health (SDOH) Interventions    Readmission Risk Interventions No flowsheet data found.

## 2019-07-12 NOTE — Progress Notes (Signed)
CRITICAL CARE PROGRESS NOTE    Name: John Haas MRN: [id number] DOB: 1965/05/04     LOS: 7   SUBJECTIVE FINDINGS & SIGNIFICANT EVENTS   Patient description:   John Haas is a 54 y.o. male with medical history significant for sickle thalassemia, COPD, diabetes and hypertension who presented to the emergency room with a complaint of headache and aching all over.  He apparently went to [facility] ED yesterday but left without being seen.  In the ER tonight he became belligerent at times and at times appeared confused.  Also while in the emergency room he was noted to have intermittent shaking of his left arm and leg with deviation of his head to the left but he remained awake. Patient had an extensive work-up in the emergency room that was significant for white cell count of 22,500 up from his baseline of 17,000 and he had mild hyponatremia of 129.  Creatinine was 1.48 and blood sugar 348.  Flu and Covid test were negative.  He also had extensive imaging with CT venogram of the head, CT angio head and neck with and without contrast that showed no acute findings.  The emergency room provider spoke with hematology at [facility] who did not think the findings had to do with his sickle thalassemia.  He also spoke with neurology at [facility] health who recommended giving [place] for suspected partial seizures.  Patient received Keppra and Ativan in the emergency room.  He was also treated empirically for possible meningitis given his continued complaints of headache and slight worsening of his chronic leukocytosis.   Lines / Drains: Internal jugular central line, PIV x2  Cultures / Sepsis markers: Status post lumbar puncture, blood cultures  Antibiotics: Acyclovir, ampicillin, Rocephin,   Protocols / Consultants: PCCM, neurology,  hematology/oncology, infectious disease, hospitalist service   Events: 07/07/19-patient with recurrent partial seizure activity, evaluated at bedside with neurologist, patient is at high risk of trauma to tongue with possible bleeding/aspiration into airway, currently unable to protect airway, decision to intubate emergently.  Post intubation was able to speak with his sister and discuss care plan.  She is thankful for care, questions were answered sister is agreeable with care plan.  A lumbar puncture was able to be performed after endotracheal intubation.  Patient was placed on propofol and seizure activity has resolved. 07/08/19 -no seizure activity overnight. 07/09/19- plan to wean sedation with trial to liberate from MV today.  5/3 remains intubated,sedated, severe delerium 5/3 extubated 5/4 severe delirium, on dilaudid and precedex infusion High risk for re-intubation 5/5 severe delirium, Started on Precedex , remains on Dilaudid,High risk for reintubation cardiac arrest   High risk for aspiration pneumonia  Chief complaint Follow-up delirium and follow-up respiratory failure  HPI Patient started on Precedex for severe delirium and agitation Going through drug withdrawals High risk for intubation High risk for cardiac arrest  Overall prognosis is poor 5/5 5/3 extrub    MEDICATIONS   Current Medication:  Current Facility-Administered Medications:  .  acetaminophen (TYLENOL) tablet 650 mg, 650 mg, Oral, Q6H PRN, 650 mg at 07/05/19 1940 **OR** acetaminophen (TYLENOL) suppository 650 mg, 650 mg, Rectal, Q6H PRN, Judd Gaudier V, MD .  cefTRIAXone (ROCEPHIN) 2 g in sodium chloride 0.9 % 100 mL IVPB, 2 g, Intravenous, Q12H, Tsosie Billing, MD, Stopped at 07/11/19 2337 .  chlorhexidine gluconate (MEDLINE KIT) (PERIDEX) 0.12 % solution 15 mL, 15 mL, Mouth Rinse, BID, Lanney Gins, Fuad, MD, 15 mL at 07/11/19 2000 .  Chlorhexidine Gluconate Cloth  2 % PADS 6 each, 6 each, Topical,  Daily, Athena Masse, MD, 6 each at 07/11/19 2200 .  cloNIDine (CATAPRES - Dosed in mg/24 hr) patch 0.3 mg, 0.3 mg, Transdermal, Weekly, Farmer Mccahill, MD, 0.3 mg at 07/10/19 1701 .  dexmedetomidine (PRECEDEX) 400 MCG/100ML (4 mcg/mL) infusion, 0.4-1.2 mcg/kg/hr, Intravenous, Titrated, Blakeney, Dana G, NP, Last Rate: 20.5 mL/hr at 07/12/19 0600, 0.8 mcg/kg/hr at 07/12/19 0600 .  dextrose 5 % with KCl 20 mEq / L  infusion, 20 mEq, Intravenous, Continuous, Binta Statzer, MD, Last Rate: 50 mL/hr at 07/12/19 0600, Rate Verify at 07/12/19 0600 .  diphenhydrAMINE (BENADRYL) injection 12.5 mg, 12.5 mg, Intravenous, Q6H PRN, 12.5 mg at 07/12/19 0104 **OR** diphenhydrAMINE (BENADRYL) 12.5 MG/5ML elixir 12.5 mg, 12.5 mg, Oral, Q6H PRN, Mortimer Fries, Tyanna Hach, MD .  enoxaparin (LOVENOX) injection 40 mg, 40 mg, Subcutaneous, BID, Damira Kem, MD, 40 mg at 61/95/09 3267 .  folic acid injection 1 mg, 1 mg, Intravenous, Daily, Makalynn Berwanger, MD, 1 mg at 07/11/19 1316 .  hydrALAZINE (APRESOLINE) injection 10-20 mg, 10-20 mg, Intravenous, Q6H PRN, Awilda Bill, NP, 20 mg at 07/10/19 2017 .  HYDROmorphone (DILAUDID) 1 mg/mL PCA injection, , Intravenous, Q4H, Flora Lipps, MD, Stopped at 07/12/19 0413 .  HYDROmorphone (DILAUDID) injection 1 mg, 1 mg, Intravenous, Q3H PRN, Flora Lipps, MD, 1 mg at 07/11/19 2233 .  insulin aspart (novoLOG) injection 0-20 Units, 0-20 Units, Subcutaneous, Q4H, Darel Hong D, NP, 3 Units at 07/12/19 0408 .  levETIRAcetam (KEPPRA) IVPB 1000 mg/100 mL premix, 1,000 mg, Intravenous, Q12H, Alexis Goodell, MD, Stopped at 07/11/19 2025 .  magnesium sulfate IVPB 2 g 50 mL, 2 g, Intravenous, Once, Awilda Bill, NP .  MEDLINE mouth rinse, 15 mL, Mouth Rinse, 10 times per day, Ottie Glazier, MD, 15 mL at 07/12/19 0608 .  naloxone [facility]) injection 0.4 mg, 0.4 mg, Intravenous, PRN **AND** sodium chloride flush (NS) 0.9 % injection 9 mL, 9 mL, Intravenous, PRN, Mortimer Fries, Zoiey Christy, MD .  nystatin  (MYCOSTATIN) 100000 UNIT/ML suspension 500,000 Units, 5 mL, Oral, QID, Darel Hong D, NP, 500,000 Units at 07/11/19 2223 .  ondansetron (ZOFRAN) injection 4 mg, 4 mg, Intravenous, Q6H PRN, Flora Lipps, MD, 4 mg at 07/11/19 1908 .  pantoprazole (PROTONIX) injection 40 mg, 40 mg, Intravenous, Daily, Corley Kohls, MD, 40 mg at 07/11/19 0902 .  phenytoin (DILANTIN) 150 mg in sodium chloride 0.9 % 100 mL IVPB, 150 mg, Intravenous, Q8H, Alexis Goodell, MD, Stopped at 07/12/19 [phone] .  promethazine (PHENERGAN) injection 6.25 mg, 6.25 mg, Intravenous, Q6H PRN, Awilda Bill, NP .  sodium chloride flush (NS) 0.9 % injection 10-40 mL, 10-40 mL, Intracatheter, Q12H, Darel Hong D, NP, 10 mL at 07/11/19 2221 .  thiamine (B-1) injection 100 mg, 100 mg, Intravenous, Daily, Mortimer Fries, Kaymarie Wynn, MD, 100 mg at 07/11/19 1151   REVIEW OF SYSTEMS  PATIENT IS UNABLE TO PROVIDE COMPLETE REVIEW OF SYSTEM S DUE TO SEVERE CRITICAL ILLNESS AND ENCEPHALOPATHY    PHYSICAL EXAMINATION   Vital Signs: Temp:  [92.1 F (33.4 C)-98.8 F (37.1 C)] 97.8 F (36.6 C) (05/05 0400) Pulse Rate:  [50-103] 103 (05/04 1400) Resp:  [10-22] 10 (05/05 0700) BP: (79-164)/(39-115) 91/54 (05/05 0700) SpO2:  [93 %-99 %] 95 % (05/05 0413) FiO2 (%):  [0 %] 0 % (05/05 0035)  PHYSICAL EXAMINATION:  GENERAL:critically ill appearing,  HEAD: Normocephalic, atraumatic.  EYES: Pupils equal, round, reactive to light.  No scleral icterus.  MOUTH: Moist mucosal membrane. NECK: Supple.  No thyromegaly. No nodules. No JVD.  PULMONARY: +rhonchi,  CARDIOVASCULAR: S1 and S2. Regular rate and rhythm. No murmurs, rubs, or gallops.  GASTROINTESTINAL: Soft, nontender, -distended. Positive bowel sounds.  MUSCULOSKELETAL: No swelling, clubbing, or edema.  NEUROLOGIC: obtunded SKIN:intact,warm,dry        PERTINENT DATA     Infusions: . cefTRIAXone (ROCEPHIN)  IV Stopped (07/11/19 2337)  . dexmedetomidine (PRECEDEX) IV infusion 0.8  mcg/kg/hr (07/12/19 0600)  . dextrose 5 % with KCl 20 mEq / L 50 mL/hr at 07/12/19 0600  . levETIRAcetam Stopped (07/11/19 2025)  . magnesium sulfate bolus IVPB    . phenytoin (DILANTIN) IV Stopped (07/12/19 0547)   Scheduled Medications: . chlorhexidine gluconate (MEDLINE KIT)  15 mL Mouth Rinse BID  . Chlorhexidine Gluconate Cloth  6 each Topical Daily  . cloNIDine  0.3 mg Transdermal Weekly  . enoxaparin (LOVENOX) injection  40 mg Subcutaneous BID  . folic acid  1 mg Intravenous Daily  . HYDROmorphone   Intravenous Q4H  . insulin aspart  0-20 Units Subcutaneous Q4H  . mouth rinse  15 mL Mouth Rinse 10 times per day  . nystatin  5 mL Oral QID  . pantoprazole (PROTONIX) IV  40 mg Intravenous Daily  . sodium chloride flush  10-40 mL Intracatheter Q12H  . thiamine injection  100 mg Intravenous Daily   PRN Medications: acetaminophen **OR** acetaminophen, diphenhydrAMINE **OR** diphenhydrAMINE, hydrALAZINE, HYDROmorphone (DILAUDID) injection, naloxone **AND** sodium chloride flush, ondansetron (ZOFRAN) IV, promethazine Hemodynamic parameters:   Intake/Output: 05/04 0701 - 05/05 0700 In: 2096.8 [I.V.:1264.3; IV Piggyback:832.5] Out: 6415 [AXENM:0768]  Ventilator  Settings: FiO2 (%):  [0 %] 0 %    LAB RESULTS:  Basic Metabolic Panel: Recent Labs  Lab 07/08/19 0600 07/08/19 0600 07/09/19 0344 07/09/19 0344 07/10/19 0338 07/10/19 0338 07/11/19 0424 07/12/19 0432  NA 150*  --  149*  --  146*  --  147* 147*  K 3.4*   < > 4.3   < > 4.1   < > 3.6 3.6  CL 119*  --  119*  --  115*  --  113* 113*  CO2 26  --  25  --  26  --  28 28  GLUCOSE 160*  --  224*  --  199*  --  191* 163*  BUN 21*  --  17  --  16  --  18 16  CREATININE 1.08  --  1.25*  --  0.98  --  0.85 1.00  CALCIUM 8.3*  --  8.0*  --  8.1*  --  8.4* 8.0*  MG 2.5*  --   --   --  1.9  --  2.1 1.7  PHOS 2.3*  --   --   --  3.5  --  2.6 2.6   < > = values in this interval not displayed.   Liver Function  Tests: Recent Labs  Lab 07/07/19 0237 07/08/19 1139  AST 12* 12*  ALT 15 11  ALKPHOS 80 71  BILITOT 1.2 0.9  PROT 6.6 5.5*  ALBUMIN 2.9* 2.6*   No results for input(s): LIPASE, AMYLASE in the last 168 hours. No results for input(s): AMMONIA in the last 168 hours. CBC: Recent Labs  Lab 07/08/19 0345 07/08/19 0345 07/08/19 2250 07/09/19 0344 07/10/19 0338 07/11/19 0424 07/12/19 0432  WBC 19.2*  --   --  18.6* 19.6* 22.9* 20.9*  NEUTROABS  --   --   --   --   --  14.2* 11.8*  HGB 7.9*   < > 9.7* 10.7* 10.1* 11.0* 10.2*  HCT 24.7*   < > 30.6* 33.5* 31.7* 34.1* 31.8*  MCV 67.5*  --   --  69.5* 69.4* 69.3* 69.7*  PLT 181  --   --  200 173 149* 143*   < > = values in this interval not displayed.   Cardiac Enzymes: No results for input(s): CKTOTAL, CKMB, CKMBINDEX, TROPONINI in the last 168 hours. BNP: Invalid input(s): POCBNP CBG: Recent Labs  Lab 07/11/19 1129 07/11/19 1634 07/11/19 1940 07/12/19 0008 07/12/19 0356  GLUCAP 119* 234* 178* 126* 128*       IMAGING RESULTS:  Imaging: DG Forearm Left  Result Date: 07/11/2019 CLINICAL DATA:  54 year old male with fall and trauma to the left upper extremity. EXAM: LEFT FOREARM - 2 VIEW COMPARISON:  None. FINDINGS: There is no acute fracture or dislocation. The bones are well mineralized. No arthritic changes. No joint effusion. The soft tissues are unremarkable. IMPRESSION: Negative. Electronically Signed   By: Anner Crete M.D.   On: 07/11/2019 19:38   DG Abd 1 View  Result Date: 07/11/2019 CLINICAL DATA:  Vomiting EXAM: ABDOMEN - 1 VIEW COMPARISON:  07/07/2019 FINDINGS: The bowel gas pattern is normal. No radio-opaque calculi. Sclerosis in the proximal femurs likely due to history of sickle cell. IMPRESSION: Negative. Electronically Signed   By: Donavan Foil M.D.   On: 07/11/2019 20:52   DG Chest Port 1 View  Result Date: 07/10/2019 CLINICAL DATA:  Intubation check EXAM: PORTABLE CHEST 1 VIEW COMPARISON:   05/10/2019, 4:49 a.m. FINDINGS: Endotracheal tube is slightly retracted, measuring approximately 6.5 cm above the carina although still below the thoracic inlet. Esophagogastric tube poorly visualized on underpenetrated examination although appears remain below the diaphragm. Unchanged mild, diffuse interstitial opacity. No new airspace opacity. IMPRESSION: Endotracheal tube is slightly retracted, measuring approximately 6.5 cm above the carina although still below the thoracic inlet. Electronically Signed   By: Eddie Candle M.D.   On: 07/10/2019 11:25   DG Shoulder Left  Result Date: 07/11/2019 CLINICAL DATA:  [place] at home EXAM: LEFT SHOULDER - 2+ VIEW COMPARISON:  None. FINDINGS: Mild AC joint degenerative change. No fracture or malalignment. Sclerosis in the left humeral head consistent with AVN IMPRESSION: No acute osseous abnormality.  AVN of the humeral head Electronically Signed   By: Donavan Foil M.D.   On: 07/11/2019 19:33   _0 @ DG Forearm Left  Result Date: 07/11/2019 CLINICAL DATA:  54 year old male with fall and trauma to the left upper extremity. EXAM: LEFT FOREARM - 2 VIEW COMPARISON:  None. FINDINGS: There is no acute fracture or dislocation. The bones are well mineralized. No arthritic changes. No joint effusion. The soft tissues are unremarkable. IMPRESSION: Negative. Electronically Signed   By: Anner Crete M.D.   On: 07/11/2019 19:38   DG Abd 1 View  Result Date: 07/11/2019 CLINICAL DATA:  Vomiting EXAM: ABDOMEN - 1 VIEW COMPARISON:  07/07/2019 FINDINGS: The bowel gas pattern is normal. No radio-opaque calculi. Sclerosis in the proximal femurs likely due to history of sickle cell. IMPRESSION: Negative. Electronically Signed   By: Donavan Foil M.D.   On: 07/11/2019 20:52   DG Shoulder Left  Result Date: 07/11/2019 CLINICAL DATA:  [place] home EXAM: LEFT SHOULDER - 2+ VIEW COMPARISON:  None. FINDINGS: Mild AC joint degenerative change. No fracture or malalignment. Sclerosis  in the left humeral head consistent with AVN IMPRESSION: No acute osseous abnormality.  AVN of  the humeral head Electronically Signed   By: Donavan Foil M.D.   On: 07/11/2019 19:33     ASSESSMENT AND PLAN   54 yo AAM with SCD admitted fro severe agitation and delerium +intermittent shaking of his left arm and leg with deviation of his head to the leftwhile awake. Felt to be partial seizures and started on Keppra.Patient hypothermic and with elevated white blood cell count. Due to possibility of CNS infection patient started on broad spectrum antibiotics to include Acyclovir, Ampicillin and Ceftriaxone. Patient progressed to severe encephalopathy and severe resp failure with inability to protect airway, extubated however, high risk for aspiration pneumonia and re-intubation  Severe ACUTE Hypoxic and Hypercapnic Respiratory Failure Oxygen as needed Underlying OSA High risk for reintubation High risk for severe aspiration pneumonia   Neurology + CNS infection + Seizure + PRES +polysubstance abuse Patient remains on Dilaudid infusion Precedex started for severe delirium Patient has clonidine patch  Sickle cell disease Hematology is following [facility] hematology does not feel this is a sickle cell crisis  Right foot ulcer INFECTIOUS DISEASE -continue antibiotics as prescribed -follow up cultures -follow up ID consultation  ELECTROLYTES -follow labs as needed -replace as needed -pharmacy consultation and following    DVT/GI PRX ordered TRANSFUSIONS AS NEEDED MONITOR FSBS ASSESS the need for LABS as needed     Critical Care Time devoted to patient care services described in this note is 34 minutes.   Overall, patient is critically ill, prognosis is guarded.  Patient with Multiorgan failure and at high risk for cardiac arrest and death.    Corrin Parker, M.D.  [facility]  Medical Director [place] Director [facility]  Cardio-Pulmonary Department

## 2019-07-12 NOTE — Progress Notes (Signed)
ID Doing much better C/o pain left shoulder  Awake , responding verbally to questions appropriately Says he is still unable to focus with his eyes and said he could not see well As per his nurse he gets agitated intermittently   Objective  BP 102/68   Pulse 79   Temp 98.4 F (36.9 C) (Axillary)   Resp 11   Ht 6\' 3"  (1.905 m)   Wt 102.7 kg   SpO2 100%   BMI 28.30 kg/m  O/E awake/alert Oriented in place, person, year. Month Non neck stiffness B/l enlarged parotid glands Left IJ PERL ?? Diplopia on the rt eye? Chest b/l air entry Moves all limbs   CBC Latest Ref Rng & Units 07/12/2019 07/11/2019 07/10/2019  WBC 4.0 - 10.5 K/uL 20.9(H) 22.9(H) 19.6(H)  Hemoglobin 13.0 - 17.0 g/dL 10.2(L) 11.0(L) 10.1(L)  Hematocrit 39.0 - 52.0 % 31.8(L) 34.1(L) 31.7(L)  Platelets 150 - 400 K/uL 143(L) 149(L) 173    CMP Latest Ref Rng & Units 07/12/2019 07/11/2019 07/10/2019  Glucose 70 - 99 mg/dL 09/09/2019) 967(E) 938(B)  BUN 6 - 20 mg/dL 16 18 16   Creatinine 0.61 - 1.24 mg/dL 017(P 1.02  Sodium 135 - 145 mmol/L 147(H) 147(H) 146(H)  Potassium 3.5 - 5.1 mmol/L 3.6 3.6 4.1  Chloride 98 - 111 mmol/L 113(H) 113(H) 115(H)  CO2 22 - 32 mmol/L 28 28 26   Calcium 8.9 - 10.3 mg/dL 8.0(L) 8.4(L) 8.1(L)  Total Protein 6.5 - 8.1 g/dL - - -  Total Bilirubin 0.3 - 1.2 mg/dL - - -  Alkaline Phos 38 - 126 U/L - - -  AST 15 - 41 U/L - - -  ALT 0 - 44 U/L - - -     Micro CSF HSV DNA neg VDRL neg Crypto neg Cell count 9 RBC  555 Glucose 188 TP 138 CSF culture negative   MRI brain w/wo contrast - 07/08/19 Cortical diffusion abnormality within the right hemisphere has resolved aside from a small area in the right occipital lobe. This may indicate postictal change or small area of subacute ischemia  Cortical diffusion-weighted signal abnormality and corresponding T2/FLAIR hyperintensity involving much of the right parietal lobe and extending inferiorly to involve portions of the right occipital lobe and  posteromedial right temporal lobe. There are a few additional punctate foci of restricted diffusion within the high right parietal lobe. Findings are suspicious for seizure related changes and/or cerebritis. Acute ischemia cannot be excluded, particularly for the punctate foci within the high right parietal Lobe.  EEG 07/06/19 background slowing with focal slowing and sharp waves intermittently noted in the right temporoparietal region   Impression/Recommendation 54 year old male with history of opioid use including heroin, admitted with severe right-sided headache, partial seizures with left side deviation of the eyes, initial MRI showing flair hyperintensity involving much of the right parietal lobe and extending inferiorly to involve portions of the right occipital and posteromedial right temporal lobe.  Partial seizures.  The working diagnosis was initially cerebritis --the DD of, infection, sickle cell beta plus thalassemia, toxidrome. The repeat MRI done in 48 hours with contrast showed the prior findings had resolved. This ruled out cerebritis and cerebral abscess. The LP done on 4/30 ruled out HSV infection and the culture was negative for bacteria. Meningitis encephalitis PCR panel is pending.  Patient was started on IV acyclovir, IV ampicillin and ceftriaxone on day of admission and acyclovir was discontinued on 07/08/2019.  Ampicillin was discontinued on 5-2/21.  The working diagnosis now  is withdrawal from opioids versus PRES due to hypertension or  sickle beta plus  Thalassemia. Subacute ischemia was in the differential as per MRI report   As there is no evidence of bacterial meningitis or infection will discontinue ceftriaxone(day 9) Patient seen by neurologist and is on antiseizure medications  Patient has a right IJ PICC line.  Consider changing it into a midline to avoid CLABSI  Baseline leukocytosis ranging around 16 and now it is 20.  Patient has sickle cell beta plus  thalassemia.  Also has been on steroids.  And that would explain the leukocytosis.  Steroids now has been discontinued.  Discussed the management with the care team.

## 2019-07-12 NOTE — Discharge Summary (Signed)
Physician Discharge Summary  Patient ID: John Haas MRN: [id number] DOB/AGE: 08/07/1965 54 y.o.  Admit date: 07/04/2019 Discharge date: 07/13/2019  Admission Diagnoses:HTN CRISIS AND SEIZURES  Discharge Diagnoses:  Principal Problem:   Suspect Meningitis Active Problems:   Sickle thalassemia disease ([facility])   Partial seizure ([place])   Headache   COPD with acute bronchitis (HCC)   Type 2 diabetes mellitus without complication ([place])   Acute confusion   Recurrent seizures (HCC)   Encephalopathy   Discharged Condition: STABLE  Hospital Course:  John Haas a 54 y.o.malewith medical history significant forsickle thalassemia, COPD, diabetes and hypertension who presented to the emergency room with a complaint of headache and aching all over.He apparently went to [facility] ED yesterday but left without being seen. In the ER tonight he became belligerent at times and at times appeared confused. Also while in the emergency room he was noted to have intermittent shaking of his left arm and leg with deviation of his head to the left but he remained awake.Patient had an extensive work-up in the emergency room that was significant for white cell count of 22,500 up from his baseline of 17,000 and he had mild hyponatremia of 129. Creatinine was 1.48 and blood sugar 348. Flu and Covid test were negative. He also had extensive imaging with CT venogram of the head, CT angio head and neck with and without contrast that showed no acute findings. The emergency room provider spoke with hematology at [facility] who did not think the findings had to do with his sickle thalassemia. He also spoke with neurology at [facility] health who recommended giving [place] for suspected partial seizures. Patient received Keppra and Ativan in the emergency room.He was also treated empirically for possible meningitis given his continued complaints of headache and slight worsening of his chronic leukocytosis.   Lines /  Drains: Internal jugular central line, PIV x2  Cultures / Sepsis markers: Status post lumbar puncture, blood cultures  Antibiotics: Acyclovir, ampicillin, Rocephin,   Protocols / Consultants: PCCM, neurology, hematology/oncology, infectious disease, hospitalist service   Events: 07/07/19-patient with recurrent partial seizure activity, evaluated at bedside with neurologist, patient is at high risk of trauma to tongue with possible bleeding/aspiration into airway, currently unable to protect airway, decision to intubate emergently.  Post intubation was able to speak with his sister and discuss care plan.  She is thankful for care, questions were answered sister is agreeable with care plan.  A lumbar puncture was able to be performed after endotracheal intubation.  Patient was placed on propofol and seizure activity has resolved. 07/08/19 -no seizure activity overnight. 07/09/19- plan to wean sedation with trial to liberate from MV today.  5/3 remains intubated,sedated, severe delerium 5/3 extubated 5/4 severe delirium, on dilaudid and precedex infusion High risk for re-intubation 5/5 severe delirium, Started on Precedex , remains on Dilaudid,High risk for reintubation cardiac arrest   High risk for aspiration pneumonia Consults: ID, NEUROLOGY, HEM-ONC 5/6 more alert and awake, off precedex and dilaudid  Significant Diagnostic Studies:   Treatments:  PRECEDEX  DILAUDID VENT SUPPORT   Discharge Exam: Blood pressure (!) 139/114, pulse (!) 107, temperature 98.9 F (37.2 C), temperature source Oral, resp. rate 15, height 6' 3" (1.905 m), weight 102.7 kg, SpO2 97 %. Physical Examination:   General Appearance: No resp distress  Neuro:without focal findings,  HEENT: PERRLA, EOM intact.   Pulmonary: normal breath sounds, No wheezing.  CardiovascularNormal S1,S2.  No m/r/g.   Abdomen: Benign, Soft, non-tender. Renal:  No costovertebral tenderness  GU:  Not performed at this  time. Endoc: No evident thyromegaly Skin:   warm, no rashes, no ecchymosis  Extremities RT foot ulcer PSYCHIATRIC:agitated mood, intermittent delerium   ALL OTHER ROS ARE NEGATIVE   Disposition: LTACH   Allergies as of 07/13/2019      Reactions   Ketamine Hives   Nubain [nalbuphine Hcl] Hives   Stadol [butorphanol] Hives      Medication List    STOP taking these medications   acetaminophen 500 MG tablet Commonly known as: TYLENOL   albuterol 108 (90 Base) MCG/ACT inhaler Commonly known as: VENTOLIN HFA   aspirin 81 MG EC tablet   buprenorphine-naloxone 8-2 mg Subl SL tablet Commonly known as: SUBOXONE   Cholecalciferol 25 MCG (1000 UT) capsule   ciprofloxacin 500 MG tablet Commonly known as: CIPRO   fluticasone 50 MCG/ACT nasal spray Commonly known as: FLONASE   folic acid 1 MG tablet Commonly known as: FOLVITE   insulin aspart 100 UNIT/ML injection Commonly known as: novoLOG   lidocaine 5 % Commonly known as: LIDODERM   naloxone 4 MG/0.1ML Liqd nasal spray kit Commonly known as: NARCAN   nicotine 21 mg/24hr patch Commonly known as: NICODERM CQ - dosed in mg/24 hours   omeprazole 20 MG capsule Commonly known as: PRILOSEC   ondansetron 4 MG disintegrating tablet Commonly known as: ZOFRAN-ODT   pravastatin 20 MG tablet Commonly known as: PRAVACHOL   SEMAGLUTIDE(0.25 OR 0.5MG/DOS)    tamsulosin 0.4 MG Caps capsule Commonly known as: FLOMAX     TAKE these medications   cloNIDine 0.3 mg/24hr patch Commonly known as: CATAPRES - Dosed in mg/24 hr Place 1 patch (0.3 mg total) onto the skin once a week. Start taking on: Jul 17, 2019   levETIRAcetam 1000 MG/100ML Soln Commonly known as: KEPPRA Inject 100 mLs (1,000 mg total) into the vein every 12 (twelve) hours.   pantoprazole 40 MG tablet Commonly known as: PROTONIX Take 1 tablet (40 mg total) by mouth daily. Start taking on: Jul 14, 2019   phenytoin 150 mg in sodium chloride 0.9 % 100  mL Inject 150 mg into the vein every 8 (eight) hours.   QUEtiapine 100 MG tablet Commonly known as: SEROQUEL Take 1 tablet (100 mg total) by mouth at bedtime.   thiamine 100 MG tablet Take 1 tablet (100 mg total) by mouth daily. Start taking on: Jul 14, 2019        Signed: Flora Lipps 07/13/2019, 12:02 PM

## 2019-07-12 NOTE — Progress Notes (Signed)
  Speech Language Pathology Treatment: Dysphagia  Patient Details Name: John Haas MRN: [id number] DOB: 07/25/1965 Today's Date: 07/12/2019 Time: 1440-1520 SLP Time Calculation (min) (ACUTE ONLY): 40 min  Assessment / Plan / Recommendation Clinical Impression  Pt seen today for ongoing assessment of swallowing; toleration of diet. Diet initiated yesterday; report of N/V last evening per NSG. AOx4 but remains delirious and irate at times. He is verbally engaging but needs verbal cues to redirect to tasks.  Pt consumed po trials of thin liquids and purees (few boluses of each consistency) w/ no overt, clinical s/s of aspiration noted during/post trials. No decline in respiratory status and no wet vocal quality noted during/post trials. Monitoring of drinking of liquids to limit impulsive drinking was given in order to reduce risk for aspiration. Suspect pt's declined Cognitive status and need for meds for agitation are impacting overall awareness for safe follow through w/ po tasks. NSG reported pt is more alert and calmer this afternoon. Pt assisted w/ calling his Sister on the phone also.  Recommend continue a mech soft diet w/ aspiration precautions; Pills in Puree for safer swallowing; assistance at all meals d/t Cognitive/mental status decline at this time. ST services will continue to f/u for toleration of diet next 1-3 days. Recommend oral care b/f and post meals. NSG updated. Recommend Dietician f/u for support.     HPI HPI: Pt is a 54 y.o. male with medical history significant for sickle thalassemia, COPD, diabetes and hypertension who presented to the emergency room with a complaint of headache and aching all over.  He apparently went to [facility] ED yesterday but left without being seen.  In the ER tonight he became belligerent at times and at times appeared confused.  Also while in the emergency room he was noted to have intermittent shaking of his left arm and leg with deviation of his head to  the left but he remained awake and alert during these episodes. Felt to be partial seizures and started on Keppra.  Patient afebrile but with elevated white blood cell count.  On 07/07/2019, noted patient with recurrent partial seizure activity, evaluated at bedside with neurologist, patient is at high risk of trauma to tongue with possible bleeding/aspiration into airway, currently unable to protect airway, decision to intubate emergently.  He was extubated on 07/10/2019.  Pt continues to exhibit delirium, on medications to calm behaviors.       SLP Plan  Continue with current plan of care       Recommendations  Diet recommendations: Dysphagia 3 (mechanical soft);Thin liquid Liquids provided via: Cup;Straw(monitor) Medication Administration: Whole meds with puree(for safer swallowing) Supervision: Patient able to self feed;Full supervision/cueing for compensatory strategies Compensations: Minimize environmental distractions;Slow rate;Small sips/bites;Follow solids with liquid Postural Changes and/or Swallow Maneuvers: Seated upright 90 degrees;Upright 30-60 min after meal                General recommendations: (Dietician f/u) Oral Care Recommendations: Oral care BID;Oral care before and after PO;Staff/trained caregiver to provide oral care Follow up Recommendations: None(TBD) SLP Visit Diagnosis: Dysphagia, unspecified (R13.10) Plan: Continue with current plan of care       GO                 Jerilynn Som, MS, CCC-SLP Alyne Martinson 07/12/2019, 3:21 PM

## 2019-07-12 NOTE — Progress Notes (Addendum)
3:30pm - CH visited pt. after discussion w/RN who said pt. might benefit from company in rm.  Pt. lying down, complaining of pain in his shoulder; Pt. awake and alert, able to talk slowly; lamented several times that he was very sick and apologized for not being better company.  CH provided active listening and supportive presence.  Pt. said he appreciated CH visit.  CH will follow up later this evening if possible.  7:30pm - CH visited pt. while making evening rounds in ICU; pt. lying down in bed; sister @ bedside.  Pt. seemed to be slightly more relaxed than in afternoon.  Pt. said he hadpain in shoulder, leg, but he and sister bantering back and forth good-naturedly.  Sister remarked pt. seems much improved since Monday: no longer intubated and can talk now.  CH will continue to monitor as possible.

## 2019-07-12 NOTE — Progress Notes (Signed)
CH encountered pt.'s brother Rocky Link in ICU hallway; br. asked about meeting w/SW team to discuss potential discharge options for pt.; CH contacted SW team via secure chat and relayed message re: meeting w/pt.'s br.  CH will follow up w/br./SW/pt. tomorrow.    07/11/19 1600  Clinical Encounter Type  Visited With Family  Visit Type Initial;Social support;Psychological support;Critical Care  Referral From Family  Spiritual Encounters  Spiritual Needs Other (Comment) (Information; meet w/SW team)  Stress Factors  Patient Stress Factors Major life changes;Health changes;Loss of control (Pain)  Family Stress Factors Health changes;Major life changes

## 2019-07-12 NOTE — Progress Notes (Signed)
fPharmacy Electrolyte Monitoring Consult:  Pharmacy consulted to assist in monitoring and replacing electrolytes in this 54 y.o. male admitted on 07/04/2019 with Fall, Headache, Eye Problem, and Hyperglycemia   Labs:  Sodium (mmol/L)  Date Value  07/12/2019 147 (H)   Potassium (mmol/L)  Date Value  07/12/2019 3.6   Magnesium (mg/dL)  Date Value  61/90/1222 1.7   Phosphorus (mg/dL)  Date Value  41/14/6431 2.6   Calcium (mg/dL)  Date Value  42/76/7011 8.0 (L)   Albumin (g/dL)  Date Value  00/34/9611 2.6 (L)   Corrected Calcium: 9.1  Assessment/Plan: Patient extubated 5/3.   Electrolytes: Advance  D5/Potassium at 75 mL/hr. Magnesium 2g IV x 1 ordered. Will obtain all electrolytes with am labs. Will replace to maintain potassium ~ 4 and goal magnesium ~ 2.   Pharmacy will continue to monitor and adjust per consult.   Dusty Wagoner L, RPh 07/12/2019 4:09 PM

## 2019-07-12 NOTE — Progress Notes (Addendum)
CH encountered br. Iantha Fallen in ICU hallway; br. requested [facility] relay desire to meet w/SW to team; hopes to meet during time when both sisters can be present as well.  Br. shared that he grew up in [place] but moved to [place] for college and lived in the [place] before moving recently to [place]; br. works as Forensic scientist.  Br. reported sisters have tended to be more involved in pt.'s direct care per their proximity but he has done his best to be supportive.  CH will follow up w/br. after ICU rounds.  Br. appreciative of CH support.    07/12/19 0955  Clinical Encounter Type  Visited With Family  Visit Type Follow-up;Psychological support

## 2019-07-13 ENCOUNTER — Ambulatory Visit (HOSPITAL_COMMUNITY)
Admission: AD | Admit: 2019-07-13 | Discharge: 2019-07-13 | Disposition: A | Discharge status: Home / Self Care | Payer: Medicare Other | Source: Other Acute Inpatient Hospital | Attending: Internal Medicine | Admitting: Internal Medicine

## 2019-07-13 DIAGNOSIS — R569 Unspecified convulsions: Secondary | ICD-10-CM | POA: Insufficient documentation

## 2019-07-13 LAB — BASIC METABOLIC PANEL
Anion gap: 6 (ref 5–15)
BUN: 8 mg/dL (ref 6–20)
CO2: 27 mmol/L (ref 22–32)
Calcium: 8 mg/dL — ABNORMAL LOW (ref 8.9–10.3)
Chloride: 107 mmol/L (ref 98–111)
Creatinine, Ser: 0.91 mg/dL (ref 0.61–1.24)
GFR calc Af Amer: >60 mL/min (ref >60)
GFR calc non Af Amer: >60 mL/min (ref >60)
Glucose, Bld: 196 mg/dL — ABNORMAL HIGH (ref 70–99)
Potassium: 3.3 mmol/L — ABNORMAL LOW (ref 3.5–5.1)
Sodium: 140 mmol/L (ref 135–145)

## 2019-07-13 LAB — CBC WITH DIFFERENTIAL/PLATELET
Abs Immature Granulocytes: 0.13 10*3/uL — ABNORMAL HIGH (ref 0.00–0.07)
Basophils Absolute: 0 10*3/uL (ref 0.0–0.1)
Basophils Relative: 0 %
Eosinophils Absolute: 0.2 10*3/uL (ref 0.0–0.5)
Eosinophils Relative: 1 %
HCT: 34.9 % — ABNORMAL LOW (ref 39.0–52.0)
Hemoglobin: 11.6 g/dL — ABNORMAL LOW (ref 13.0–17.0)
Immature Granulocytes: 1 %
Lymphocytes Relative: 20 %
Lymphs Abs: 3.4 10*3/uL (ref 0.7–4.0)
MCH: 22.2 pg — ABNORMAL LOW (ref 26.0–34.0)
MCHC: 33.2 g/dL (ref 30.0–36.0)
MCV: 66.9 fL — ABNORMAL LOW (ref 80.0–100.0)
Monocytes Absolute: 2.5 10*3/uL — ABNORMAL HIGH (ref 0.1–1.0)
Monocytes Relative: 15 %
Neutro Abs: 10.5 10*3/uL — ABNORMAL HIGH (ref 1.7–7.7)
Neutrophils Relative %: 63 %
Platelets: 154 10*3/uL (ref 150–400)
RBC: 5.22 MIL/uL (ref 4.22–5.81)
RDW: 20.2 % — ABNORMAL HIGH (ref 11.5–15.5)
WBC: 16.7 10*3/uL — ABNORMAL HIGH (ref 4.0–10.5)
nRBC: 2 % — ABNORMAL HIGH (ref 0.0–0.2)

## 2019-07-13 LAB — CULTURE, BLOOD (ROUTINE X 2)
Culture: NO GROWTH
Culture: NO GROWTH
Special Requests: ADEQUATE
Special Requests: ADEQUATE

## 2019-07-13 LAB — GLUCOSE, CAPILLARY
Glucose-Capillary: 186 mg/dL — ABNORMAL HIGH (ref 70–99)
Glucose-Capillary: 160 mg/dL — ABNORMAL HIGH (ref 70–99)
Glucose-Capillary: 184 mg/dL — ABNORMAL HIGH (ref 70–99)
Glucose-Capillary: 187 mg/dL — ABNORMAL HIGH (ref 70–99)

## 2019-07-13 LAB — ANAEROBIC CULTURE: Gram Stain: NONE SEEN

## 2019-07-13 LAB — CYTOLOGY - NON PAP

## 2019-07-13 LAB — MAGNESIUM: Magnesium: 1.8 mg/dL (ref 1.7–2.4)

## 2019-07-13 MED ORDER — SODIUM CHLORIDE 0.9 % IV SOLN: 150.0000 mg | Freq: Three times a day (TID) | INTRAVENOUS | Status: AC

## 2019-07-13 MED ORDER — QUETIAPINE FUMARATE 100 MG PO TABS: 100.0000 mg | ORAL_TABLET | Freq: Every day | ORAL | Status: AC

## 2019-07-13 MED ORDER — PANTOPRAZOLE SODIUM 40 MG PO TBEC: 40.0000 mg | DELAYED_RELEASE_TABLET | Freq: Every day | ORAL | Status: AC

## 2019-07-13 MED ORDER — POLYETHYLENE GLYCOL 3350 17 G PO PACK: 17.0000 g | PACK | Freq: Every day | ORAL | Status: DC

## 2019-07-13 MED ORDER — CLONIDINE 0.3 MG/24HR TD PTWK: 0.3000 mg | MEDICATED_PATCH | TRANSDERMAL | 12 refills | Status: AC

## 2019-07-13 MED ORDER — THIAMINE HCL 100 MG PO TABS: 100.0000 mg | ORAL_TABLET | Freq: Every day | ORAL | Status: AC

## 2019-07-13 MED ORDER — LEVETIRACETAM IN NACL 1000 MG/100ML IV SOLN: 1000.0000 mg | Freq: Two times a day (BID) | INTRAVENOUS | Status: AC

## 2019-07-13 MED ORDER — MAGNESIUM SULFATE 2 GM/50ML IV SOLN
2.0000 g | Freq: Once | INTRAVENOUS | Status: AC
  Administered 2019-07-13: 2 g via INTRAVENOUS
  Filled 2019-07-13: qty 50

## 2019-07-13 MED ORDER — POTASSIUM CHLORIDE 2 MEQ/ML IV SOLN
INTRAVENOUS | Status: DC
  Filled 2019-07-13: qty 1000

## 2019-07-13 NOTE — TOC Transition Note (Signed)
Transition of Care [facility]) - CM/SW Discharge Note   Patient Details  Name: John Haas MRN: [id number] Date of Birth: 1965/09/03  [facility] of [facility]) CM/SW Contact:  Liliana Cline, LCSW Phone Number: 07/13/2019, 1:32 PM   Clinical Narrative:   Patient to transfer to [facility] today. RN Trula Ore and Dr. Belia Heman have called report to [facility] staff. Patient will be in Room 412 at [facility]. RN called [facility] and arranged transport, [facility] estimated to arrive around 2:00 pm to pick patient up per [facility] Representative. CSW updated [facility] Arboriculturist and patient's brother Iantha Fallen via phone. Paperwork for [facility] placed by patient's chart. No additional needs identified.     Final next level of care: Long Term Acute Care (LTAC) Barriers to Discharge: Barriers Resolved   Patient Goals and CMS Choice   CMS Medicare.gov Compare Post Acute Care list provided to:: Patient Represenative (must comment) Choice offered to / list presented to : Sibling  Discharge Placement              Patient chooses bed at: ([facility] [place], [place]) Patient to be transferred to facility by: [facility] Name of family member notified: Iantha Fallen Patient and family notified of of transfer: 07/13/19  Discharge Plan and Services                                     Social Determinants of Health (SDOH) Interventions     Readmission Risk Interventions Readmission Risk Prevention Plan 07/13/2019  Transportation Screening Complete  HRI or Home Care Consult Complete  Palliative Care Screening Complete  Medication Review (RN Care Manager) Complete

## 2019-07-13 NOTE — Progress Notes (Signed)
CH visited pt. while passing through ICU and noticing pt. seemed awake.  Upon greeting pt., pt. tried to talk but seemed to need to clear throat.  Pt. seemed to be about to cough and then began to vomit; CH alerted RN.      07/12/19 2220  Clinical Encounter Type  Visited With Patient  Visit Type Follow-up

## 2019-07-13 NOTE — TOC Progression Note (Addendum)
Transition of Care [facility]) - Progression Note    Patient Details  Name: John Haas MRN: [id number] Date of Birth: May 03, 1965  Transition of Care [facility]) CM/SW [place], LCSW Phone Number: 07/13/2019, 9:25 AM  Clinical Narrative:   CSW spoke with patient's brother, [name]. [name] said they plan to tour at [facility] around 10 am today. He verbalized understanding that patient would be ready to transfer to [facility] today. He had insurance questions which CSW encouraged him to talk to [employer] about. [name] reported he will reach out to [place] after their visit to [facility] this morning.  CSW updated Dr. Mortimer Fries.  11:30- Spoke with [facility] Rep Lauren who confirmed they can accept patient today.  Met with family [name] & [name]) who reported their tour went well. They are agreeable to patient transferring to [facility] today. Updated Dr. Mortimer Fries and Ander Purpura. Waiting on room # from [place].   12:20- Dr. Mortimer Fries filled out EMTALA and Med Necessity Form for EMS transport and spoke with Attending MD at [facility] already. Waiting on Room # so RN can call [facility] and [facility] for report.  1:15:- Spoke with [facility] Rep Lauren. Patient will be in Room 412. Provided information and number to call report to [employer]. CSW printed Med Necessity Form, DC Summary, and Face Sheet and placed by chart for [facility]. RN to call [facility] to arrange transportation.      Barriers to Discharge: Continued Medical Work up  Expected Discharge Plan and Services         Living arrangements for the past 2 months: Single Family Home                                       Social Determinants of Health (SDOH) Interventions    Readmission Risk Interventions Readmission Risk Prevention Plan 07/13/2019  Transportation Screening Complete  HRI or Home Care Consult Complete  Palliative Care Screening Complete  Medication Review (RN Care Manager) Complete

## 2019-07-13 NOTE — Progress Notes (Signed)
Hematology/Oncology Consult note [facility]  Telephone:([phone] [phone]  Patient Care Team: System, Pcp Not In as PCP - General   Name of the patient: John Haas  [id number]  Aug 12, 1965   Date of visit: 07/13/2019   Interval history- patient alert and awake. No seizures. Gives appropriate history. Reports he took heroin prior to admission as his pain was not well controlled with suboxone. Currently reports having trouble seeing in the center from his right eye  ECOG PS- 3 Pain scale- 3   Review of systems- Review of Systems  Constitutional: Positive for malaise/fatigue. Negative for chills, fever and weight loss.  HENT: Negative for congestion, ear discharge and nosebleeds.   Eyes: Positive for blurred vision.  Respiratory: Negative for cough, hemoptysis, sputum production, shortness of breath and wheezing.   Cardiovascular: Negative for chest pain, palpitations, orthopnea and claudication.  Gastrointestinal: Negative for abdominal pain, blood in stool, constipation, diarrhea, heartburn, melena, nausea and vomiting.  Genitourinary: Negative for dysuria, flank pain, frequency, hematuria and urgency.  Musculoskeletal: Negative for back pain, joint pain and myalgias.  Skin: Negative for rash.  Neurological: Negative for dizziness, tingling, focal weakness, seizures, weakness and headaches.  Endo/Heme/Allergies: Does not bruise/bleed easily.  Psychiatric/Behavioral: Negative for depression and suicidal ideas. The patient does not have insomnia.       Allergies  Allergen Reactions  . Ketamine Hives  . Nubain [Nalbuphine Hcl] Hives  . Stadol [Butorphanol] Hives     Past Medical History:  Diagnosis Date  . COPD (chronic obstructive pulmonary disease) ([place])   . Diabetes mellitus without complication ([place])   . Heart attack ([place])   . Hypertension   . 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 Sickle cell anemia ([facility])      Past Surgical History:  Procedure Laterality Date  . ANKLE SURGERY    . KNEE SURGERY    . PORT-A-CATH REMOVAL      Social History   Socioeconomic History  . Marital status: Single    Spouse name: Not on file  . Number of children: Not on file  . Years of education: Not on file  . Highest education level: Not on file  Occupational History  . Not on file  Tobacco Use  . Smoking status: Current Every Day Smoker  . Smokeless tobacco: Never Used  Substance and Sexual Activity  . Alcohol use: No  . Drug use: No  . Sexual activity: Not on file  Other Topics Concern  . Not on file  Social History Narrative  . Not on file   Social Determinants of Health   Financial Resource Strain:   . Difficulty of Paying Living Expenses:   Food Insecurity:   . Worried About Charity fundraiser in the Last Year:   . Arboriculturist in the Last Year:   Transportation Needs:   . Film/video editor (Medical):   Marland Kitchen Lack of Transportation (Non-Medical):   Physical Activity:   . Days of Exercise per Week:   . Minutes of Exercise per Session:   Stress:   . Feeling of Stress :   Social Connections:   . Frequency of Communication with Friends and Family:   . Frequency of Social Gatherings with Friends and Family:   . Attends Religious Services:   . Active Member of Clubs or Organizations:   . Attends Archivist Meetings:   Marland Kitchen Marital  Status:   Intimate Partner Violence:   . Fear of Current or Ex-Partner:   . Emotionally Abused:   Marland Kitchen Physically Abused:   . Sexually Abused:     No family history on file.   Current Facility-Administered Medications:  .  acetaminophen (TYLENOL) tablet 650 mg, 650 mg, Oral, Q6H PRN, 650 mg at 07/12/19 2200 **OR** acetaminophen (TYLENOL) suppository 650 mg, 650 mg, Rectal, Q6H PRN, Athena Masse, MD .  chlorhexidine gluconate (MEDLINE KIT) (PERIDEX) 0.12 % solution 15 mL, 15 mL, Mouth Rinse, BID,  Lanney Gins, Fuad, MD, 15 mL at 07/13/19 0816 .  Chlorhexidine Gluconate Cloth 2 % PADS 6 each, 6 each, Topical, Daily, Athena Masse, MD, 6 each at 07/13/19 1024 .  cloNIDine (CATAPRES - Dosed in mg/24 hr) patch 0.3 mg, 0.3 mg, Transdermal, Weekly, Kasa, Kurian, MD, 0.3 mg at 07/10/19 1701 .  dextrose 5 % 1,000 mL with potassium chloride 40 mEq infusion, , Intravenous, Continuous, Dallie Piles, RPH, Last Rate: 50 mL/hr at 07/13/19 1023, New Bag at 07/13/19 1023 .  enoxaparin (LOVENOX) injection 40 mg, 40 mg, Subcutaneous, Q24H, Kasa, Kurian, MD, 40 mg at 07/13/19 1021 .  feeding supplement (ENSURE ENLIVE) (ENSURE ENLIVE) liquid 237 mL, 237 mL, Oral, TID BM, Kasa, Kurian, MD .  folic acid (FOLVITE) tablet 1 mg, 1 mg, Oral, Daily, Kasa, Kurian, MD, 1 mg at 07/13/19 1023 .  hydrALAZINE (APRESOLINE) injection 10-20 mg, 10-20 mg, Intravenous, Q6H PRN, Awilda Bill, NP, 20 mg at 07/10/19 2017 .  HYDROmorphone (DILAUDID) injection 1 mg, 1 mg, Intravenous, Q3H PRN, Flora Lipps, MD, 1 mg at 07/13/19 1315 .  insulin aspart (novoLOG) injection 0-20 Units, 0-20 Units, Subcutaneous, Q4H, Darel Hong D, NP, 4 Units at 07/13/19 1129 .  levETIRAcetam (KEPPRA) IVPB 1000 mg/100 mL premix, 1,000 mg, Intravenous, Q12H, Alexis Goodell, MD, Stopped at 07/13/19 [phone] .  multivitamin with minerals tablet 1 tablet, 1 tablet, Oral, Daily, Flora Lipps, MD, 1 tablet at 07/13/19 1021 .  nystatin (MYCOSTATIN) 100000 UNIT/ML suspension 500,000 Units, 5 mL, Oral, QID, Darel Hong D, NP, 500,000 Units at 07/13/19 1041 .  pantoprazole (PROTONIX) EC tablet 40 mg, 40 mg, Oral, Daily, Kasa, Kurian, MD, 40 mg at 07/13/19 1021 .  phenytoin (DILANTIN) 150 mg in sodium chloride 0.9 % 100 mL IVPB, 150 mg, Intravenous, Q8H, Alexis Goodell, MD, Stopped at 07/13/19 [phone]  polyethylene glycol (MIRALAX / GLYCOLAX) packet 17 g, 17 g, Oral, Daily, Gerald Dexter, RPH .  promethazine (PHENERGAN) injection 6.25 mg, 6.25  mg, Intravenous, Q6H PRN, Awilda Bill, NP, 6.25 mg at 07/13/19 0816 .  QUEtiapine (SEROQUEL) tablet 100 mg, 100 mg, Oral, QHS, Kasa, Kurian, MD, 100 mg at 07/12/19 2200 .  sodium chloride flush (NS) 0.9 % injection 10-40 mL, 10-40 mL, Intracatheter, Q12H, Darel Hong D, NP, 10 mL at 07/12/19 2210 .  thiamine tablet 100 mg, 100 mg, Oral, Daily, Kasa, Kurian, MD, 100 mg at 07/13/19 1021  Current Outpatient Medications:  .  [START ON 07/17/2019] cloNIDine (CATAPRES - DOSED IN MG/24 HR) 0.3 mg/24hr patch, Place 1 patch (0.3 mg total) onto the skin once a week., Disp: 4 patch, Rfl: 12 .  levETIRAcetam (KEPPRA) 1000 MG/100ML SOLN, Inject 100 mLs (1,000 mg total) into the vein every 12 (twelve) hours., Disp: 2000 mL, Rfl:  .  [START ON 07/14/2019] pantoprazole (PROTONIX) 40 MG tablet, Take 1 tablet (40 mg total) by mouth daily., Disp:  , Rfl:  .  phenytoin 150 mg  in sodium chloride 0.9 % 100 mL, Inject 150 mg into the vein every 8 (eight) hours., Disp:  , Rfl:  .  QUEtiapine (SEROQUEL) 100 MG tablet, Take 1 tablet (100 mg total) by mouth at bedtime., Disp:  , Rfl:  .  [START ON 07/14/2019] thiamine 100 MG tablet, Take 1 tablet (100 mg total) by mouth daily., Disp:  , Rfl:   Physical exam:  Vitals:   07/13/19 1100 07/13/19 1200 07/13/19 1300 07/13/19 1344  BP: (!) 146/96 137/82  131/81  Pulse:  (!) 126 (!) 109 (!) 107  Resp: _0 Temp:  99 F (37.2 C)  99 F (37.2 C)  TempSrc:  Oral  Oral  SpO2:  100% 98% 97%  Weight:      Height:       Physical Exam Constitutional:      Comments: He is tearful and is apologetic for using heroin prior to admission  Cardiovascular:     Rate and Rhythm: Normal rate and regular rhythm.     Heart sounds: Normal heart sounds.  Pulmonary:     Effort: Pulmonary effort is normal.  Abdominal:     General: Bowel sounds are normal.     Palpations: Abdomen is soft.  Skin:    General: Skin is warm and dry.  Neurological:     Mental Status: He is alert  and oriented to person, place, and time.      CMP Latest Ref Rng & Units 07/13/2019  Glucose 70 - 99 mg/dL 196(H)  BUN 6 - 20 mg/dL 8  Creatinine 0.61 - 1.24 mg/dL 0.91  Sodium 135 - 145 mmol/L 140  Potassium 3.5 - 5.1 mmol/L 3.3(L)  Chloride 98 - 111 mmol/L 107  CO2 22 - 32 mmol/L 27  Calcium 8.9 - 10.3 mg/dL 8.0(L)  Total Protein 6.5 - 8.1 g/dL -  Total Bilirubin 0.3 - 1.2 mg/dL -  Alkaline Phos 38 - 126 U/L -  AST 15 - 41 U/L -  ALT 0 - 44 U/L -   CBC Latest Ref Rng & Units 07/13/2019  WBC 4.0 - 10.5 K/uL 16.7(H)  Hemoglobin 13.0 - 17.0 g/dL 11.6(L)  Hematocrit 39.0 - 52.0 % 34.9(L)  Platelets 150 - 400 K/uL 154    _1 @  EEG  Result Date: 07/07/2019 Alexis Goodell, MD     07/07/2019  2:03 PM ELECTROENCEPHALOGRAM REPORT Patient: Calen Geister       Room #: IC13A-AA EEG No. ID: [id number] Age: 54 y.o.        Sex: male Requesting Physician: Swayze Report Date:  07/07/2019       Interpreting Physician: Alexis Goodell History: Edmar Blankenburg is an 54 y.o. male with altered mental status and seizures Medications: Keppra, Dilantin, Acyclovir, Omnipen, Rocephin, Decadron, Insulin, Ativan Conditions of Recording:  This is a 21 channel routine scalp EEG performed with bipolar and monopolar montages arranged in accordance to the international 10/20 system of electrode placement. One channel was dedicated to EKG recording. The patient is in the altered state state. Description:  The background activity is slow and poorly organized.  It consists of a polymorphic delta activity that is diffusely distributed.  There is superimposed fast beta activity noted as well.  There is no noted hemispheric asymmetry. There is no noted epileptiform activity.  There are no incidences of eye deviation or head turning.  Hyperventilation and intermittent photic stimulation were not performed. IMPRESSION: This is an abnormal EEG secondary to general background slowing.  This finding may be seen with a diffuse  disturbance that is etiologically nonspecific, but may include a metabolic encephalopathy, among other possibilities.  No epileptiform activity was noted.  The superimposed beta activity noted is consistent with medication effect.  Alexis Goodell, MD Neurology [phone] 07/07/2019, 1:57 PM   EEG  Result Date: 07/06/2019 Alexis Goodell, MD     07/06/2019  4:17 PM ELECTROENCEPHALOGRAM REPORT Patient: Kwabena Strutz       Room #: IC13A-AA EEG No. ID: [id number] Age: 54 y.o.        Sex: male Requesting Physician: Jamse Arn Report Date:  07/06/2019       Interpreting Physician: Alexis Goodell History: Macarthur Lorusso is an 54 y.o. male with new onset seizures Medications: Acyclovir, Ampicillin, Suboxone, Rocephin, Decadron, Insulin, Keppra, Flomax, Protonix Conditions of Recording:  This is a 21 channel routine scalp EEG performed with bipolar and monopolar montages arranged in accordance to the international 10/20 system of electrode placement. One channel was dedicated to EKG recording. The patient is in the poorly responsive state. Description:  The background activity is slow and poorly organized.  This slow activity consists of a low voltage delta activity that is diffusely distributed.  There is superimposed beta activity noted as well.  The patient is positioned on his left.  Despite this there does appear some intermittent further slowing over the right temporoparietal region.   Frequent sharp waves are noted in this region as well.  The patient has periods when he further turns his head to the left and extensive artifact is noted.  Patient is able to respond to questioning at these times.  Patient also has periods of build up in beta activity over the right hemisphere without change clinically.  Hyperventilation and intermittent photic stimulation were not performed. IMPRESSION: This is an abnormal electroencephalogram secondary to general background slowing with focal slowing and sharp waves  intermittently noted in the right temporoparietal region.  This is consistent with the patient's history of focal left sided seizures.  Patient also with episodes of fast beta activity-some with head turning but responsiveness and some with no change in clinical activity.  Difficult clear characterization but can not rule out focal seizure activity.  Alexis Goodell, MD Neurology [phone] 07/06/2019, 4:04 PM   CT Angio Head W or Wo Contrast  Result Date: 07/04/2019 CLINICAL DATA:  Weakness, syncope. EXAM: CT ANGIOGRAPHY HEAD AND NECK TECHNIQUE: Multidetector CT imaging of the head and neck was performed using the standard protocol during bolus administration of intravenous contrast. Multiplanar CT image reconstructions and MIPs were obtained to evaluate the vascular anatomy. Carotid stenosis measurements (when applicable) are obtained utilizing NASCET criteria, using the distal internal carotid diameter as the denominator. CONTRAST:  64m OMNIPAQUE IOHEXOL 350 MG/ML SOLN COMPARISON:  None. FINDINGS: CT HEAD FINDINGS Brain: There is no mass, hemorrhage or extra-axial collection. The size and configuration of the ventricles and extra-axial CSF spaces are normal. 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 The brain parenchyma is normal. Skull: The visualized skull base, calvarium and extracranial soft tissues are normal. Sinuses/Orbits: No fluid levels or advanced mucosal thickening of the visualized paranasal sinuses. No mastoid or middle ear effusion. The orbits are normal. CTA NECK FINDINGS SKELETON: There is no bony spinal canal stenosis. No lytic or blastic lesion. OTHER NECK: Normal pharynx, larynx and major salivary glands. No cervical lymphadenopathy. Unremarkable thyroid gland. UPPER CHEST: No pneumothorax or pleural effusion. No nodules or masses. AORTIC ARCH: There is no calcific atherosclerosis of the aortic arch.  There is no aneurysm, dissection or hemodynamically significant stenosis of the  visualized portion of the aorta. Conventional 3 vessel aortic branching pattern. The visualized proximal subclavian arteries are widely patent. RIGHT CAROTID SYSTEM: Normal without aneurysm, dissection or stenosis. LEFT CAROTID SYSTEM: Normal without aneurysm, dissection or stenosis. VERTEBRAL ARTERIES: Codominant configuration. Both origins are clearly patent. There is no dissection, occlusion or flow-limiting stenosis to the skull base (V1-V3 segments). CTA HEAD FINDINGS POSTERIOR CIRCULATION: --Vertebral arteries: Normal V4 segments. --Posterior inferior cerebellar arteries (PICA): Patent origins from the vertebral arteries. --Anterior inferior cerebellar arteries (AICA): Patent origins from the basilar artery. --Basilar artery: Normal. --Superior cerebellar arteries: Normal. --Posterior cerebral arteries: Normal. Both originate from the basilar artery. Posterior communicating arteries (p-comm) are diminutive or absent. ANTERIOR CIRCULATION: --Intracranial internal carotid arteries: Normal. --Anterior cerebral arteries (ACA): Normal. Both A1 segments are present. Patent anterior communicating artery (a-comm). --Middle cerebral arteries (MCA): Normal. VENOUS SINUSES: As permitted by contrast timing, patent. ANATOMIC VARIANTS: None Review of the MIP images confirms the above findings. IMPRESSION: 1. No emergent large vessel occlusion or high-grade stenosis. 2. 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 Electronically Signed   By: Ulyses Jarred M.D.   On: 07/04/2019 21:25   DG Forearm Left  Result Date: 07/11/2019 CLINICAL DATA:  54 year old male with fall and trauma to the left upper extremity. EXAM: LEFT FOREARM - 2 VIEW COMPARISON:  None. FINDINGS: There is no acute fracture or dislocation. The bones are well mineralized. No arthritic changes. No joint effusion. The soft tissues are unremarkable. IMPRESSION: Negative. Electronically Signed   By: Anner Crete M.D.   On: 07/11/2019 19:38   DG Abd 1  View  Result Date: 07/11/2019 CLINICAL DATA:  Vomiting EXAM: ABDOMEN - 1 VIEW COMPARISON:  07/07/2019 FINDINGS: The bowel gas pattern is normal. No radio-opaque calculi. Sclerosis in the proximal femurs likely due to history of sickle cell. IMPRESSION: Negative. Electronically Signed   By: Donavan Foil M.D.   On: 07/11/2019 20:52   DG Abd 1 View  Result Date: 07/07/2019 CLINICAL DATA:  Orogastric tube placement. EXAM: ABDOMEN - 1 VIEW COMPARISON:  Same day. FINDINGS: The bowel gas pattern is normal. Distal tip of enteric tube is seen in expected position of proximal stomach. No radio-opaque calculi or other significant radiographic abnormality are seen. IMPRESSION: Distal tip of enteric tube seen in expected position of proximal stomach. Electronically Signed   By: Marijo Conception M.D.   On: 07/07/2019 15:18   DG Abd 1 View  Result Date: 07/07/2019 CLINICAL DATA:  Orogastric tube placement. EXAM: ABDOMEN - 1 VIEW COMPARISON:  None. FINDINGS: The bowel gas pattern is normal. Distal tip of enteric tube is seen in expected position of distal esophagus. No radio-opaque calculi or other significant radiographic abnormality are seen. IMPRESSION: Distal tip of enteric tube is seen in expected position of distal esophagus. No evidence of bowel obstruction or ileus. Electronically Signed   By: Marijo Conception M.D.   On: 07/07/2019 13:50   DG Abd 1 View  Result Date: 07/06/2019 CLINICAL DATA:  Possible heavy metal poisoning EXAM: ABDOMEN - 1 VIEW COMPARISON:  None. FINDINGS: Scattered large and small bowel gas is noted. Mild retained fecal material is noted. No intraluminal radiopacities are seen. Diffuse thickening of the femoral cortex is noted bilaterally in a symmetrical fashion. This may be related to the patient's underlying sickle cell disease or possibly underlying Paget's. No other focal abnormality is seen. IMPRESSION: Thickened cortex within the femurs in a  symmetrical fashion bilaterally likely  related to the patient's underlying sickle cell disease or possible Paget's disease. No definitive radiopacities are noted within the bowel to correspond with the given clinical history. Electronically Signed   By: Inez Catalina M.D.   On: 07/06/2019 16:12   CT Head Wo Contrast  Result Date: 07/04/2019 CLINICAL DATA:  54 year old male with history of headache. Multiple falls. Seizures. EXAM: CT HEAD WITHOUT CONTRAST TECHNIQUE: Contiguous axial images were obtained from the base of the skull through the vertex without intravenous contrast. COMPARISON:  Head CT 07/03/2019. FINDINGS: Brain: Patchy and confluent areas of decreased attenuation are noted throughout the deep and periventricular white matter of the cerebral hemispheres bilaterally, compatible with chronic microvascular ischemic disease. No evidence of acute infarction, hemorrhage, hydrocephalus, extra-axial collection or mass lesion/mass effect. Vascular: No hyperdense vessel or unexpected calcification. Skull: Normal. Negative for fracture or focal lesion. Sinuses/Orbits: No acute finding. Other: None. IMPRESSION: 1. No acute intracranial abnormalities. 2. Chronic microvascular ischemic changes in the cerebral white matter, as above. Electronically Signed   By: Vinnie Langton M.D.   On: 07/04/2019 18:13   CT Head Wo Contrast  Result Date: 07/03/2019 CLINICAL DATA:  Recent fall with headaches EXAM: CT HEAD WITHOUT CONTRAST TECHNIQUE: Contiguous axial images were obtained from the base of the skull through the vertex without intravenous contrast. COMPARISON:  None. FINDINGS: Brain: Mild atrophic changes are noted. Areas of prior ischemia are seen in the frontal lobes bilaterally. No findings to suggest acute hemorrhage, acute infarction or space-occupying mass lesion are seen. Vascular: No hyperdense vessel or unexpected calcification. Skull: Normal. Negative for fracture or focal lesion. Sinuses/Orbits: No acute finding. Other: None. IMPRESSION:  Mild atrophic changes and areas of prior ischemia without acute abnormality. Electronically Signed   By: Inez Catalina M.D.   On: 07/03/2019 23:13   CT Angio Neck W and/or Wo Contrast  Result Date: 07/04/2019 CLINICAL DATA:  Weakness, syncope. EXAM: CT ANGIOGRAPHY HEAD AND NECK TECHNIQUE: Multidetector CT imaging of the head and neck was performed using the standard protocol during bolus administration of intravenous contrast. Multiplanar CT image reconstructions and MIPs were obtained to evaluate the vascular anatomy. Carotid stenosis measurements (when applicable) are obtained utilizing NASCET criteria, using the distal internal carotid diameter as the denominator. CONTRAST:  81m OMNIPAQUE IOHEXOL 350 MG/ML SOLN COMPARISON:  None. FINDINGS: CT HEAD FINDINGS Brain: There is no mass, hemorrhage or extra-axial collection. The size and configuration of the ventricles and extra-axial CSF spaces are normal. 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 The brain parenchyma is normal. Skull: The visualized skull base, calvarium and extracranial soft tissues are normal. Sinuses/Orbits: No fluid levels or advanced mucosal thickening of the visualized paranasal sinuses. No mastoid or middle ear effusion. The orbits are normal. CTA NECK FINDINGS SKELETON: There is no bony spinal canal stenosis. No lytic or blastic lesion. OTHER NECK: Normal pharynx, larynx and major salivary glands. No cervical lymphadenopathy. Unremarkable thyroid gland. UPPER CHEST: No pneumothorax or pleural effusion. No nodules or masses. AORTIC ARCH: There is no calcific atherosclerosis of the aortic arch. There is no aneurysm, dissection or hemodynamically significant stenosis of the visualized portion of the aorta. Conventional 3 vessel aortic branching pattern. The visualized proximal subclavian arteries are widely patent. RIGHT CAROTID SYSTEM: Normal without aneurysm, dissection or stenosis. LEFT CAROTID SYSTEM: Normal without aneurysm,  dissection or stenosis. VERTEBRAL ARTERIES: Codominant configuration. Both origins are clearly patent. There is no dissection, occlusion or flow-limiting stenosis to the skull base (V1-V3 segments). CTA  HEAD FINDINGS POSTERIOR CIRCULATION: --Vertebral arteries: Normal V4 segments. --Posterior inferior cerebellar arteries (PICA): Patent origins from the vertebral arteries. --Anterior inferior cerebellar arteries (AICA): Patent origins from the basilar artery. --Basilar artery: Normal. --Superior cerebellar arteries: Normal. --Posterior cerebral arteries: Normal. Both originate from the basilar artery. Posterior communicating arteries (p-comm) are diminutive or absent. ANTERIOR CIRCULATION: --Intracranial internal carotid arteries: Normal. --Anterior cerebral arteries (ACA): Normal. Both A1 segments are present. Patent anterior communicating artery (a-comm). --Middle cerebral arteries (MCA): Normal. VENOUS SINUSES: As permitted by contrast timing, patent. ANATOMIC VARIANTS: None Review of the MIP images confirms the above findings. IMPRESSION: 1. No emergent large vessel occlusion or high-grade stenosis. 2. 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 Electronically Signed   By: Ulyses Jarred M.D.   On: 07/04/2019 21:25   MR BRAIN WO CONTRAST  Addendum Date: 07/06/2019   ADDENDUM REPORT: 07/06/2019 18:10 ADDENDUM: These results were called by telephone at the time of interpretation on 07/06/2019 at 6:10 pm to provider [facility] , who verbally acknowledged these results. Electronically Signed   By: Kellie Simmering DO   On: 07/06/2019 18:10   Result Date: 07/06/2019 CLINICAL DATA:  Ophthalmoplegia; encephalopathy, seizure, abnormal neuro exam. Additional history provided: Prior history of sickle thalassemia, COPD, diabetes and hypertension, presenting with headache and aching all over, altered at times with intermittent shaking of left arm and leg with deviation head to the left while awake, findings felt  to reflect partial seizure. EXAM: MRI HEAD WITHOUT CONTRAST TECHNIQUE: Multiplanar, multiecho pulse sequences of the brain and surrounding structures were obtained without intravenous contrast. COMPARISON:  CT venogram 07/04/2019, noncontrast head CT and CT angiogram head/neck 07/04/2019 FINDINGS: Brain: The patient was unable to tolerate the full examination. As a result only axial and coronal diffusion-weighted imaging, a sagittal T1 weighted sequence, an axial T2* sequence, an axial T2 FLAIR sequence and a coronal T2 weighted sequence were obtained. There is moderate/severe motion degradation of the sagittal T1 weighted sequence and axial T2 FLAIR sequence. There is severe motion degradation of the coronal T2 weighted sequence. There is subtle cortical diffusion-weighted signal abnormality throughout much of the right parietal lobe and extending inferiorly to involve portions of the right occipital lobe and posteromedial right temporal lobe. There are a few more punctate foci of restricted diffusion within the high right parietal lobe (series 5, image 30). Evaluation is limited due to significant motion degradation, but there appears to be corresponding T2/FLAIR hyperintensity at these sites. 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 Multifocal T2/FLAIR hyperintensity within the cerebral white matter and pons is nonspecific but likely reflect sequela of chronic small vessel ischemia. There is suggestion of a thin right-sided subdural hygroma measuring approximately 5 mm in thickness greatest overlying the right frontal lobe (for instance as seen on series 8, image 16). This finding was not definitively present on prior exams. No intracranial mass is identified. No midline shift. No chronic intracranial blood products. Vascular: Poorly assessed on the acquired sequences. Skull and upper cervical spine: No focal marrow lesion is identified within the limitations of significant motion  degradation. Sinuses/Orbits: The orbits and paranasal sinuses are poorly assessed due to the degree of motion degradation. IMPRESSION: 1. Cortical diffusion-weighted signal abnormality and corresponding T2/FLAIR hyperintensity involving much of the right parietal lobe and extending inferiorly to involve portions of the right occipital lobe and posteromedial right temporal lobe. There are a few additional punctate foci of restricted diffusion within the high right parietal lobe. Findings are suspicious for seizure related  changes and/or cerebritis. Acute ischemia cannot be excluded, particularly for the punctate foci within the high right parietal lobe. 2. 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 This finding was not definitively present on prior exams. 3. 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 4. Chronic small vessel ischemic changes within the cerebral white matter and pons. Electronically Signed: By: Kellie Simmering DO On: 07/06/2019 17:57   MR BRAIN W WO CONTRAST  Result Date: 07/08/2019 CLINICAL DATA:  Seizures. EXAM: MRI HEAD WITHOUT AND WITH CONTRAST TECHNIQUE: Multiplanar, multiecho pulse sequences of the brain and surrounding structures were obtained without and with intravenous contrast. CONTRAST:  21m GADAVIST GADOBUTROL 1 MMOL/ML IV SOLN COMPARISON:  Brain MRI 07/06/2019 FINDINGS: Brain: Cortical diffusion abnormality within the right hemisphere has resolved aside from a small area in the right occipital lobe. Areas of encephalomalacia in both frontal lobes are unchanged. There is mild multifocal white matter hyperintensity. There is an old left cerebellar infarct. No chronic microhemorrhage. Normal midline structures. 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 Skull and upper cervical spine: Normal marrow signal. Sinuses/Orbits: Negative. Other: None. IMPRESSION: 1. No acute intracranial abnormality. 2.  Cortical diffusion abnormality within the right hemisphere has resolved aside from a small area in the right occipital lobe. This may indicate postictal change or small area of subacute ischemia. 3. Unchanged areas of encephalomalacia in the frontal lobes bilaterally. Old left cerebellar infarct. Electronically Signed   By: KUlyses JarredM.D.   On: 07/08/2019 00:45   DG Chest Port 1 View  Result Date: 07/10/2019 CLINICAL DATA:  Intubation check EXAM: PORTABLE CHEST 1 VIEW COMPARISON:  05/10/2019, 4:49 a.m. FINDINGS: Endotracheal tube is slightly retracted, measuring approximately 6.5 cm above the carina although still below the thoracic inlet. Esophagogastric tube poorly visualized on underpenetrated examination although appears remain below the diaphragm. Unchanged mild, diffuse interstitial opacity. No new airspace opacity. IMPRESSION: Endotracheal tube is slightly retracted, measuring approximately 6.5 cm above the carina although still below the thoracic inlet. Electronically Signed   By: AEddie CandleM.D.   On: 07/10/2019 11:25   DG Chest Port 1 View  Result Date: 07/10/2019 CLINICAL DATA:  Acute respiratory failure EXAM: PORTABLE CHEST 1 VIEW COMPARISON:  Radiograph 07/04/2019 FINDINGS: *Endotracheal tube in the mid trachea, 5 cm from the carina. *Transesophageal tube curling in the left upper quadrant. Nonvisualization of the side port. *Telemetry leads overlie the chest. Persistent cardiomegaly and prominence of the upper mediastinum. Diffuse interstitial opacity throughout both lungs with a basilar gradient. Progressive elevation the right hemidiaphragm could reflect developing sub pulmonic effusion or right basilar volume loss. No pneumothorax. No acute osseous or soft tissue abnormality. Degenerative changes are present in the imaged spine and shoulders. IMPRESSION: 1. Progressive elevation of the right hemidiaphragm could reflect developing subpulmonic effusion or increasing right basilar volume  loss. 2. Diffuse interstitial opacity throughout both lungs with a basilar gradient, could reflect edema or infection. 3. Cardiomegaly. 4. Lines and tubes as above. Electronically Signed   By: PLovena LeM.D.   On: 07/10/2019 06:53   DG Chest Port 1 View  Result Date: 07/08/2019 CLINICAL DATA:  Headache, aching all over, COPD, smoker EXAM: PORTABLE CHEST 1 VIEW COMPARISON:  Portable exam 1146 hours compared to 07/07/2019 FINDINGS: Tip of endotracheal tube projects 6.1 cm above carina. Nasogastric tube extends into stomach. Enlargement of cardiac silhouette. Stable mediastinal contours. Scattered infiltrates in both lungs, favor mild pulmonary edema over infection. Subsegmental atelectasis  RIGHT base. No pleural effusion or pneumothorax. IMPRESSION: Enlargement of cardiac silhouette with BILATERAL pulmonary infiltrates question pulmonary edema. RIGHT basilar atelectasis. Electronically Signed   By: Lavonia Dana M.D.   On: 07/08/2019 12:57   DG Chest Port 1 View  Result Date: 07/07/2019 CLINICAL DATA:  Central line placement EXAM: PORTABLE CHEST 1 VIEW COMPARISON:  Radiographs 09/19/2018 FINDINGS: Endotracheal tube terminates in the mid trachea, 4.5 cm from the carina. Left IJ approach central venous catheter tip terminates at the brachiocephalic-caval confluence. Transesophageal tube tip again seen terminating in the proximal stomach with the side port at the level of the GE junction. Telemetry leads overlie the chest. Stable cardiomegaly with central vascular congestion and diffuse mixed interstitial and patchy airspace opacity concerning for interstitial and alveolar pulmonary edema. Hazy obscuration of the right hemidiaphragm may reflect some trace effusion. No pneumothorax. No acute osseous or soft tissue abnormality. Sclerotic changes of the left humeral head likely reflect sequela of prior avascular necrosis. Additional degenerative changes in the left shoulder and spine. IMPRESSION: 1. Left IJ approach  central venous catheter tip at the left brachiocephalic-caval confluence. 2. Endotracheal tube terminates in the mid trachea, 4.5 cm from the carina. 3. Transesophageal tube tip again seen terminating in the proximal stomach with the side port at the level of the GE junction. Consider advancing 3 cm to position in the gastric body. 4. Persistent features suggestive of interstitial and alveolar edema. Likely developing right pleural effusion. Electronically Signed   By: Lovena Le M.D.   On: 07/07/2019 22:32   Portable Chest x-ray  Result Date: 07/07/2019 CLINICAL DATA:  Endotracheal tube placement. EXAM: PORTABLE CHEST 1 VIEW COMPARISON:  July 04, 2019. FINDINGS: Stable cardiomegaly with central pulmonary vascular congestion. Endotracheal and nasogastric tubes are unchanged in position. Increased bilateral interstitial densities are noted concerning for pulmonary edema. No pneumothorax or pleural effusion is noted. Bony thorax is unremarkable. IMPRESSION: Stable support apparatus. Stable cardiomegaly with central pulmonary vascular congestion. Increased bilateral interstitial densities are noted concerning for pulmonary edema. Electronically Signed   By: Marijo Conception M.D.   On: 07/07/2019 13:50   DG Chest Portable 1 View  Result Date: 07/04/2019 CLINICAL DATA:  Chest pain EXAM: PORTABLE CHEST 1 VIEW COMPARISON:  10/04/2018, 12/13/2017 FINDINGS: Cardiomegaly. Mild diffuse coarse chronic interstitial opacity. No acute focal airspace disease or effusion. Aortic atherosclerosis. No pneumothorax. IMPRESSION: Cardiomegaly. No edema or infiltrate. Mild diffuse coarse chronic interstitial opacity Electronically Signed   By: Donavan Foil M.D.   On: 07/04/2019 18:19   DG Shoulder Left  Result Date: 07/11/2019 CLINICAL DATA:  [place] home EXAM: LEFT SHOULDER - 2+ VIEW COMPARISON:  None. FINDINGS: Mild AC joint degenerative change. No fracture or malalignment. Sclerosis in the left humeral head consistent with  AVN IMPRESSION: No acute osseous abnormality.  AVN of the humeral head Electronically Signed   By: Donavan Foil M.D.   On: 07/11/2019 19:33   ECHOCARDIOGRAM COMPLETE  Result Date: 07/08/2019    ECHOCARDIOGRAM REPORT   Patient Name:   NICOLE HAFLEY Date of Exam: 07/08/2019 Medical Rec #:  [id number]       Height:       75.0 in Accession #:    [id number]      Weight:       226.4 lb Date of Birth:  11-01-1965        BSA:          2.313 m Patient Age:    54 years  BP:           96/62 mmHg Patient Gender: M               HR:           105 bpm. Exam Location:  [facility] Procedure: 2D Echo and Intracardiac Opacification Agent Indications:     Acute Respiratory Insufficiency 518.82/ R06.89  History:         Patient has no prior history of Echocardiogram examinations.  Sonographer:     Arville Go RDCS Referring Phys:  [id number] Bradly Bienenstock Diagnosing Phys: Skeet Latch MD  Sonographer Comments: Technically challenging study due to limited acoustic windows, Technically difficult study due to poor echo windows, no subcostal window and echo performed with patient supine and on artificial respirator. Image acquisition challenging due to patient body habitus. IMPRESSIONS  1. Left ventricular ejection fraction, by estimation, is 55 to 60%. The left ventricle has normal function. The left ventricle has no regional wall motion abnormalities. There is mild concentric left ventricular hypertrophy. Left ventricular diastolic parameters are consistent with Grade I diastolic dysfunction (impaired relaxation).  2. Right ventricular systolic function is normal. The right ventricular size is normal. There is normal pulmonary artery systolic pressure.  3. The mitral valve is normal in structure. Trivial mitral valve regurgitation. No evidence of mitral stenosis.  4. The aortic valve is normal in structure. Aortic valve regurgitation is not visualized. Mild aortic valve stenosis. Aortic valve area, by VTI measures 1.68 cm.  Aortic valve mean gradient measures 10.0 mmHg. Aortic valve Vmax measures 2.22 m/s.  5. The inferior vena cava is normal in size with greater than 50% respiratory variability, suggesting right atrial pressure of 3 mmHg. FINDINGS  Left Ventricle: Left ventricular ejection fraction, by estimation, is 55 to 60%. The left ventricle has normal function. The left ventricle has no regional wall motion abnormalities. Definity contrast agent was given IV to delineate the left ventricular  endocardial borders. The left ventricular internal cavity size was normal in size. There is mild concentric left ventricular hypertrophy. Left ventricular diastolic parameters are consistent with Grade I diastolic dysfunction (impaired relaxation). Indeterminate filling pressures. Right Ventricle: The right ventricular size is normal. No increase in right ventricular wall thickness. Right ventricular systolic function is normal. There is normal pulmonary artery systolic pressure. The tricuspid regurgitant velocity is 1.59 m/s, and  with an assumed right atrial pressure of 10 mmHg, the estimated right ventricular systolic pressure is 93.9 mmHg. Left Atrium: Left atrial size was normal in size. Right Atrium: Right atrial size was normal in size. Pericardium: There is no evidence of pericardial effusion. Mitral Valve: The mitral valve is normal in structure. Normal mobility of the mitral valve leaflets. Trivial mitral valve regurgitation. No evidence of mitral valve stenosis. Tricuspid Valve: The tricuspid valve is normal in structure. Tricuspid valve regurgitation is trivial. No evidence of tricuspid stenosis. Aortic Valve: The aortic valve is normal in structure.. There is mild thickening and mild calcification of the aortic valve. Aortic valve regurgitation is not visualized. Mild aortic stenosis is present. There is mild thickening of the aortic valve. There is mild calcification of the aortic valve. Aortic valve mean gradient measures  10.0 mmHg. Aortic valve peak gradient measures 19.7 mmHg. Aortic valve area, by VTI measures 1.68 cm. Pulmonic Valve: The pulmonic valve was normal in structure. Pulmonic valve regurgitation is not visualized. No evidence of pulmonic stenosis. Aorta: The aortic root is normal in size and structure. Venous: The inferior vena  cava is normal in size with greater than 50% respiratory variability, suggesting right atrial pressure of 3 mmHg. IAS/Shunts: No atrial level shunt detected by color flow Doppler.  LEFT VENTRICLE PLAX 2D LVIDd:         5.01 cm  Diastology LVIDs:         3.30 cm  LV e' lateral:   6.31 cm/s LV PW:         1.30 cm  LV E/e' lateral: 13.4 LV IVS:        1.37 cm  LV e' medial:    8.05 cm/s LVOT diam:     2.10 cm  LV E/e' medial:  10.5 LV SV:         63 LV SV Index:   27 LVOT Area:     3.46 cm  RIGHT VENTRICLE RV Basal diam:  3.37 cm RV S prime:     19.80 cm/s LEFT ATRIUM           Index       RIGHT ATRIUM           Index LA diam:      4.40 cm 1.90 cm/m  RA Area:     15.70 cm LA Vol (A4C): 44.9 ml 19.41 ml/m RA Volume:   42.90 ml  18.55 ml/m  AORTIC VALVE AV Area (Vmax):    1.49 cm AV Area (Vmean):   1.70 cm AV Area (VTI):     1.68 cm AV Vmax:           222.00 cm/s AV Vmean:          144.000 cm/s AV VTI:            0.373 m AV Peak Grad:      19.7 mmHg AV Mean Grad:      10.0 mmHg LVOT Vmax:         95.30 cm/s LVOT Vmean:        70.600 cm/s LVOT VTI:          0.181 m LVOT/AV VTI ratio: 0.49  AORTA Ao Root diam: 3.50 cm MITRAL VALVE                TRICUSPID VALVE MV Area (PHT): 5.84 cm     TR Peak grad:   10.1 mmHg MV Decel Time: 130 msec     TR Vmax:        159.00 cm/s MV E velocity: 84.80 cm/s MV A velocity: 106.00 cm/s  SHUNTS MV E/A ratio:  0.80         Systemic VTI:  0.18 m                             Systemic Diam: 2.10 cm Skeet Latch MD Electronically signed by Skeet Latch MD Signature Date/Time: 07/08/2019/12:08:49 PM    Final    CT VENOGRAM HEAD  Result Date:  07/04/2019 CLINICAL DATA:  Syncope and weakness EXAM: CT VENOGRAM HEAD TECHNIQUE: Vena graphic images of the head were obtained following the administration of intravenous contrast material. Multiplanar reformats and maximum intensity projections were constructed. CONTRAST:  16m OMNIPAQUE IOHEXOL 350 MG/ML SOLN COMPARISON:  CTA head neck same day FINDINGS: Superior sagittal sinus: Normal. Straight sinus: Normal. Inferior sagittal sinus, vein of Galen and internal cerebral veins: Normal. Transverse sinuses: Normal. Sigmoid sinuses: Normal. Visualized jugular veins: Normal. IMPRESSION: Normal head CT venogram. Electronically Signed   By: KCletus GashD.  On: 07/04/2019 21:27   US SPLEEN (ABDOMEN LIMITED)  Result Date: 07/08/2019 CLINICAL DATA:  Sickle cell disease, beta thalassemia EXAM: ULTRASOUND ABDOMEN LIMITED COMPARISON:  None. FINDINGS: Spleen atrophic, 4.4 cm length. Heterogeneous splenic echogenicity without focal mass. No LEFT upper quadrant free fluid. 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 No LEFT upper quadrant free fluid. Electronically Signed   By: Lavonia Dana M.D.   On: 07/08/2019 14:04   DG FL GUIDED LUMBAR PUNCTURE  Result Date: 07/07/2019 CLINICAL DATA:  Lumbar puncture. EXAM: DIAGNOSTIC LUMBAR PUNCTURE UNDER FLUOROSCOPIC GUIDANCE FLUOROSCOPY TIME:  Fluoroscopy Time:  0 minutes 24 seconds Radiation Exposure Index (if provided by the fluoroscopic device): 11.1 mGy PROCEDURE: After discussing the risks and benefits of this procedure with the patient's sister informed consent was obtained. Back was sterilely prepped and draped. 22 gauge needle was advanced into the L4-L5 space and clear CSF obtained. 8 cc obtained and sent in 4 separate tubes to the ordered labs. Needle withdrawn. Hemostasis achieved. No complications. IMPRESSION: Successful fluoroscopically directed lumbar puncture. 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 Seizures are currently under good control with anti epileptics. Repeat MRI showed improved cortical activity but small area of subacute ischemia possibly in right occipital lobe. It is unclear if patients visual issues are secondary to that   2. Patient is being transferred to [facility] today. I have communicated to Dr. Mortimer Fries that he should be discharged on his outpatient dose of suboxone since he has been off opioids for close to 4 months now. I will communicate his discharge plan to his outpatient hematologist DR. Elmsworth  3. If patient has recurrence of symptoms including all but not limited to recurrent seizures- patient should be at a tertiary center preferably [facility] where exchange transfusion can be offered since there is a possibility that this may be due to ischemic stroke. I have discussed this patient and his brother as well.   4. Patients H/H is currently at his baseline between 10-11   Visit Diagnosis 1. Chronic pain syndrome   2. New onset seizure ([place])   3. Severe headache   4. Other elevated white blood cell (WBC) count   5. Encephalopathy   6. Heavy metal poison. screen   7. Endotracheal tube present   8. Encounter for nasogastric (NG) tube placement   9. Encounter for imaging study to confirm orogastric (OG) tube placement   10. Seizure ([place])   11. Encounter for central line placement   12. Sickle cell beta thalassemia ([facility])   13. Abnormal CXR   14. Acute respiratory failure ([place])   15. Endotracheally intubated   16. Trauma   17. Vomiting      Dr. Randa Evens, MD, MPH [facility] [phone] 07/13/2019

## 2019-07-13 NOTE — Progress Notes (Signed)
Patient ID: John Haas, male   DOB: [birth year], 54 y.o.   MRN: [id number] Pt is in the process of transferring to [facility]. If family changes their mind than [facility] will continue rounding from tomorrow 07/14/2019

## 2019-07-13 NOTE — Progress Notes (Signed)
ID CSF Meningitis/encephalitis panel PCR is negative . Infection ruled out as the cause for partial seizures, headache and his presentation. Pt has been transferred to [facility]

## 2019-07-13 NOTE — Progress Notes (Signed)
Pharmacy Electrolyte Monitoring Consult:  Labs:  Sodium (mmol/L)  Date Value  07/13/2019 140   Potassium (mmol/L)  Date Value  07/13/2019 3.3 (L)   Magnesium (mg/dL)  Date Value  62/05/5595 1.8   Phosphorus (mg/dL)  Date Value  41/63/8453 2.6   Calcium (mg/dL)  Date Value  64/68/0321 8.0 (L)   Albumin (g/dL)  Date Value  22/48/2500 2.6 (L)   Corrected Calcium: 8.7  Assessment/Plan: Pharmacy consulted to assist in monitoring and replacing electrolytes in this 54 y.o. male admitted to ICU with PRES versus opioid withdrawal.  PMH significant for opioid dependence, diabetes mellitus, COPD, sickle cell beta plus thalassemia.  Patient was extubated 5/3.  Electrolytes:  Goal of therapy is to replace to potassium ~4 and magnesium ~2.  Patient currently on D5 with KCl 20 mEq/L at 50 mL/hr.  Will add more KCl to fluids, advancing to D5 with KCl 40 mEq/L at 50 mL/hr.  Will also give magnesium 2g IV x 1.  Check electrolytes with AM labs.  Glucose: SBG slightly elevated.  Goal <180 mg/dL.  Patient currently on resistant SSI.  Will continue to monitor for now, as diet is fluctuating.  Constipation: Last BM unknown.  Currently receiving frequent doses of PRN dilaudid.  Will add Miralax.    Pharmacy will continue to monitor and adjust per consult.   Cherly Hensen, RPh 07/13/2019 11:35 AM

## 2019-07-17 LAB — MISC LABCORP TEST (SEND OUT): Labcorp test code: 9985

## 2019-07-21 ENCOUNTER — Telehealth: Payer: Self-pay

## 2019-07-28 LAB — CULTURE, FUNGUS WITHOUT SMEAR

## 2019-08-08 DEATH — deceased

## 2021-12-24 NOTE — Telephone Encounter (Signed)
Signing encounter, See previous note 07/20/21 

## 2022-03-01 IMAGING — DX DG CHEST 1V PORT
1 series · 2 of 2 positions shown · non-contrast
Comparison: Radiograph 07/04/2019

CLINICAL DATA: Acute respiratory failure

EXAM:
PORTABLE CHEST 1 VIEW

[Series 1: chest ap · 0.14mm/px · 2 of 2 slices shown]
[im 1/2]
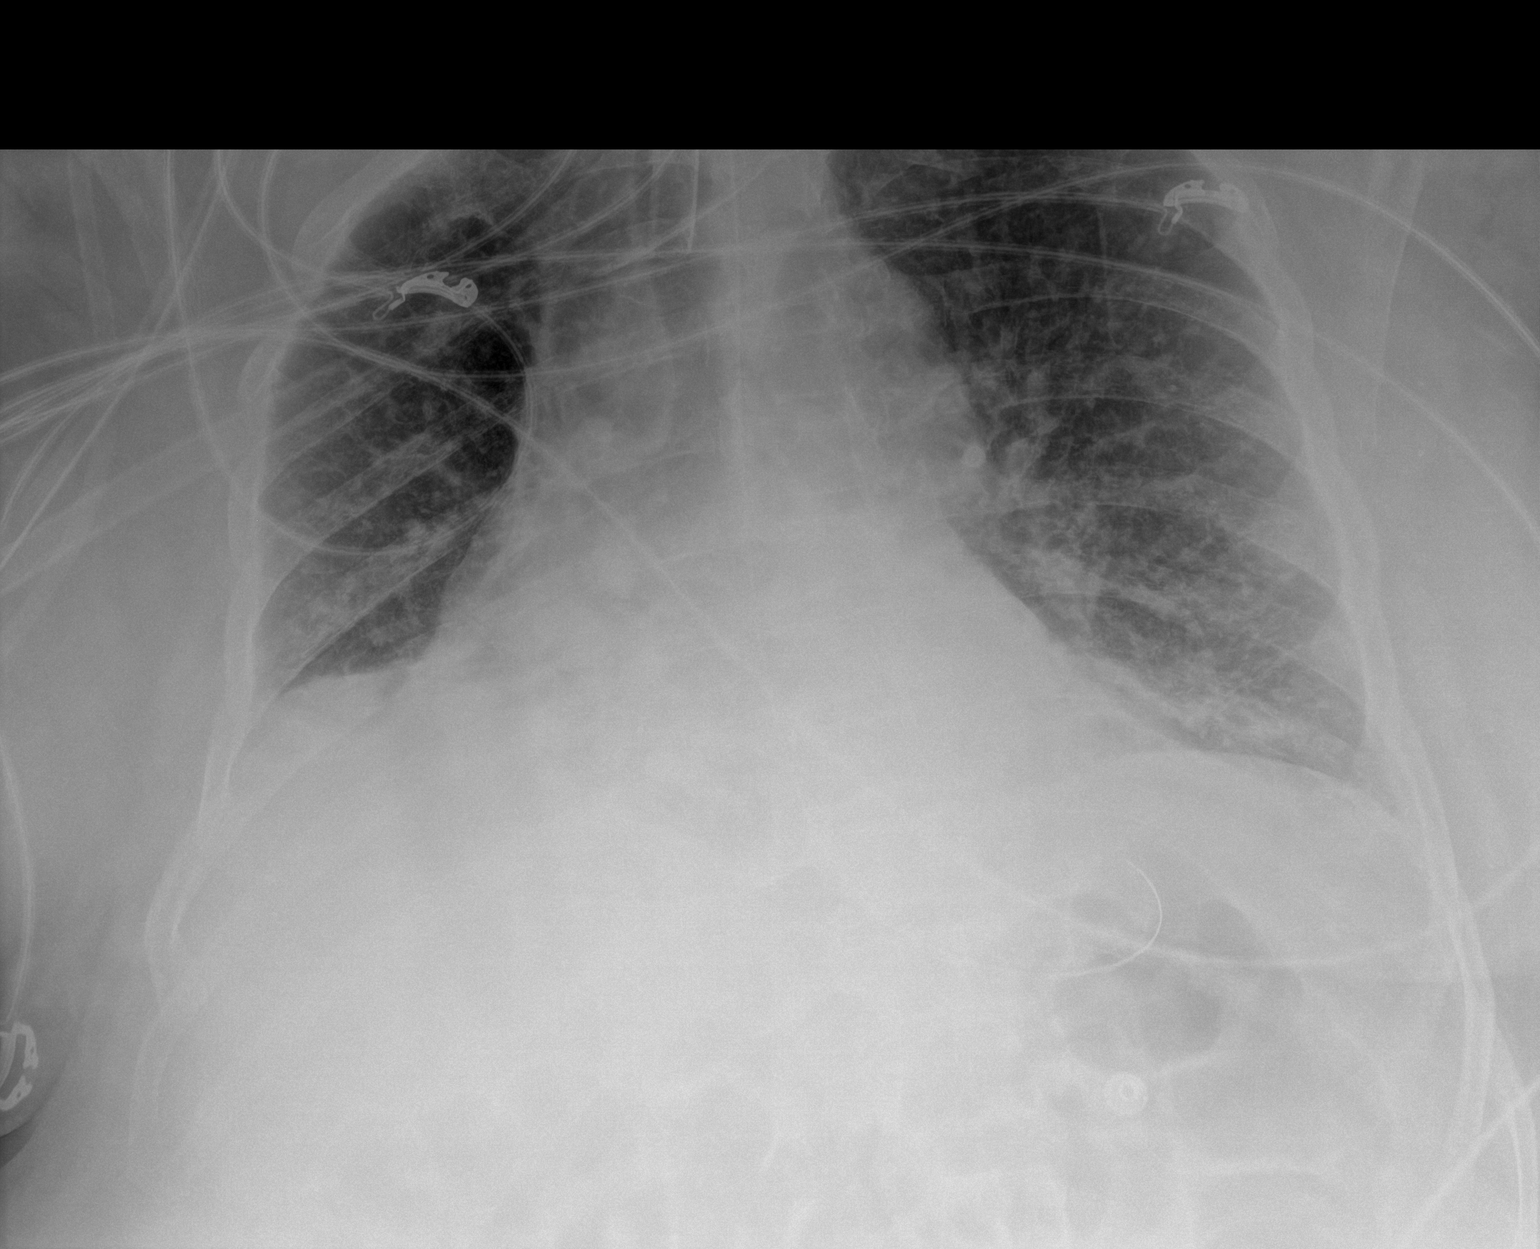
[im 2/2]
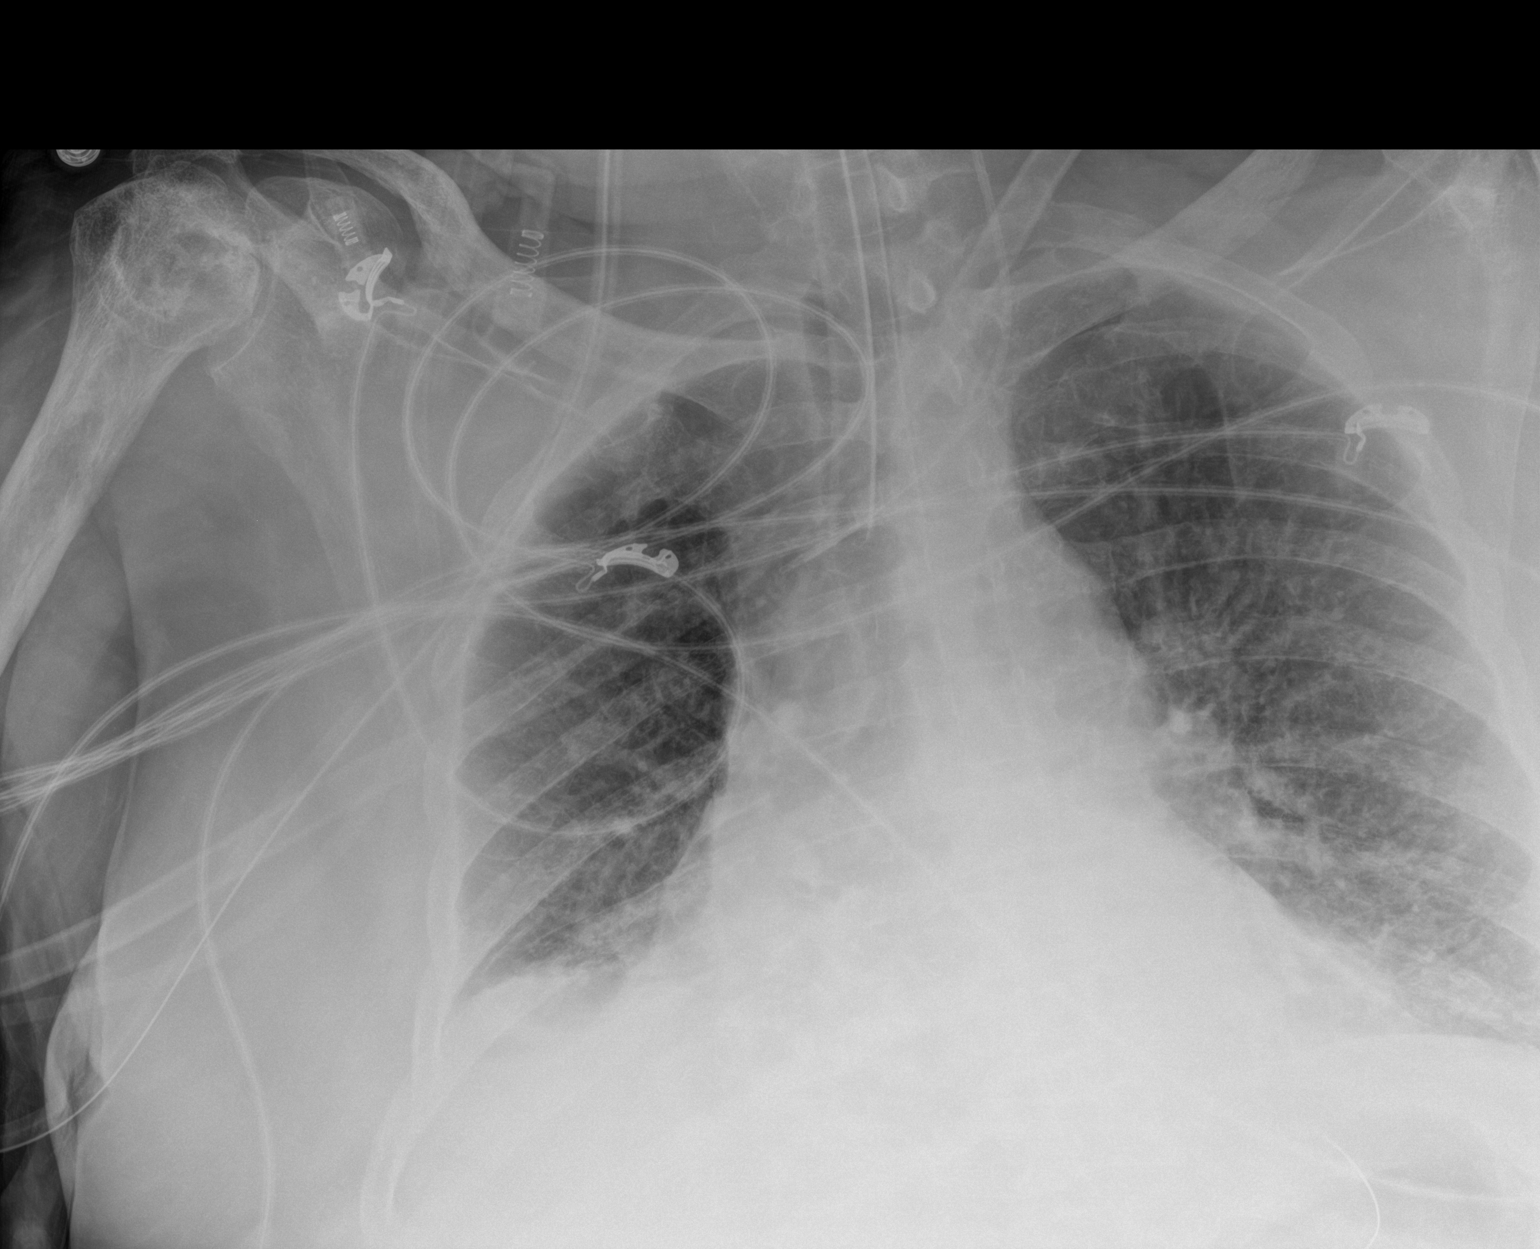

[2 of 2 positions shown; findings below may reference images not displayed]

FINDINGS: *Endotracheal tube in the mid trachea, 5 cm from the carina.
*Transesophageal tube curling in the left upper quadrant.
Nonvisualization of the side port.
*Telemetry leads overlie the chest.

Persistent cardiomegaly and prominence of the upper mediastinum.
Diffuse interstitial opacity throughout both lungs with a basilar
gradient. Progressive elevation the right hemidiaphragm could
reflect developing sub pulmonic effusion or right basilar volume
loss. No pneumothorax. No acute osseous or soft tissue abnormality.
Degenerative changes are present in the imaged spine and shoulders.
IMPRESSION: 1. Progressive elevation of the right hemidiaphragm could reflect
developing subpulmonic effusion or increasing right basilar volume
loss.
2. Diffuse interstitial opacity throughout both lungs with a basilar
gradient, could reflect edema or infection.
3. Cardiomegaly.
4. Lines and tubes as above.

## 2022-09-24 LAB — MISC LABCORP TEST (SEND OUT): Labcorp test code: 2013305
